# Patient Record
Sex: Female | Born: 1950 | Race: White | Hispanic: No | Marital: Married | Smoking: Never smoker
Health system: Southern US, Community
[De-identification: ages and names within clinical notes are randomized; demographics above are authoritative.]

## PROBLEM LIST (undated history)

## (undated) DIAGNOSIS — E785 Hyperlipidemia, unspecified: Secondary | ICD-10-CM

## (undated) DIAGNOSIS — I517 Cardiomegaly: Secondary | ICD-10-CM

## (undated) DIAGNOSIS — I350 Nonrheumatic aortic (valve) stenosis: Secondary | ICD-10-CM

## (undated) DIAGNOSIS — E669 Obesity, unspecified: Secondary | ICD-10-CM

## (undated) DIAGNOSIS — J449 Chronic obstructive pulmonary disease, unspecified: Secondary | ICD-10-CM

## (undated) DIAGNOSIS — K219 Gastro-esophageal reflux disease without esophagitis: Secondary | ICD-10-CM

## (undated) DIAGNOSIS — I119 Hypertensive heart disease without heart failure: Secondary | ICD-10-CM

## (undated) DIAGNOSIS — I6523 Occlusion and stenosis of bilateral carotid arteries: Secondary | ICD-10-CM

## (undated) DIAGNOSIS — E109 Type 1 diabetes mellitus without complications: Secondary | ICD-10-CM

## (undated) DIAGNOSIS — N183 Chronic kidney disease, stage 3 unspecified: Secondary | ICD-10-CM

## (undated) DIAGNOSIS — I2510 Atherosclerotic heart disease of native coronary artery without angina pectoris: Secondary | ICD-10-CM

## (undated) DIAGNOSIS — E039 Hypothyroidism, unspecified: Secondary | ICD-10-CM

## (undated) HISTORY — PX: APPENDECTOMY: SHX54

## (undated) HISTORY — DX: Atherosclerotic heart disease of native coronary artery without angina pectoris: I25.10

## (undated) HISTORY — DX: Occlusion and stenosis of bilateral carotid arteries: I65.23

## (undated) HISTORY — DX: Gastro-esophageal reflux disease without esophagitis: K21.9

## (undated) HISTORY — DX: Hyperlipidemia, unspecified: E78.5

## (undated) HISTORY — DX: Cardiomegaly: I51.7

## (undated) HISTORY — DX: Hypothyroidism, unspecified: E03.9

## (undated) HISTORY — DX: Obesity, unspecified: E66.9

## (undated) HISTORY — PX: KNEE SURGERY: SHX244

## (undated) HISTORY — PX: TONSILLECTOMY: SUR1361

## (undated) HISTORY — DX: Nonrheumatic aortic (valve) stenosis: I35.0

## (undated) HISTORY — PX: ABDOMINAL HYSTERECTOMY: SHX81

---

## 1998-06-05 ENCOUNTER — Inpatient Hospital Stay (HOSPITAL_COMMUNITY): Admission: EM | Admit: 1998-06-05 | Discharge: 1998-06-09 | Payer: Self-pay | Admitting: *Deleted

## 1998-06-26 ENCOUNTER — Encounter (HOSPITAL_COMMUNITY): Admission: RE | Admit: 1998-06-26 | Discharge: 1998-09-24 | Payer: Self-pay | Admitting: *Deleted

## 1998-08-23 ENCOUNTER — Ambulatory Visit (HOSPITAL_COMMUNITY): Admission: RE | Admit: 1998-08-23 | Discharge: 1998-08-23 | Payer: Self-pay | Admitting: Orthopaedic Surgery

## 2000-12-29 ENCOUNTER — Encounter: Admission: RE | Admit: 2000-12-29 | Discharge: 2000-12-29 | Payer: Self-pay

## 2007-04-04 ENCOUNTER — Encounter: Admission: RE | Admit: 2007-04-04 | Discharge: 2007-04-04 | Payer: Self-pay | Admitting: Orthopedic Surgery

## 2010-01-19 ENCOUNTER — Encounter (INDEPENDENT_AMBULATORY_CARE_PROVIDER_SITE_OTHER): Payer: Self-pay | Admitting: *Deleted

## 2010-01-24 ENCOUNTER — Encounter (INDEPENDENT_AMBULATORY_CARE_PROVIDER_SITE_OTHER): Payer: Self-pay | Admitting: *Deleted

## 2010-01-26 ENCOUNTER — Encounter (INDEPENDENT_AMBULATORY_CARE_PROVIDER_SITE_OTHER): Payer: Self-pay | Admitting: *Deleted

## 2010-01-26 ENCOUNTER — Ambulatory Visit: Payer: Self-pay | Admitting: Gastroenterology

## 2010-09-10 ENCOUNTER — Ambulatory Visit: Payer: Self-pay | Admitting: Oncology

## 2010-09-26 LAB — CBC & DIFF AND RETIC
Basophils Absolute: 0 10*3/uL (ref 0.0–0.1)
Eosinophils Absolute: 0.3 10*3/uL (ref 0.0–0.5)
HCT: 32.1 % — ABNORMAL LOW (ref 34.8–46.6)
HGB: 10.3 g/dL — ABNORMAL LOW (ref 11.6–15.9)
LYMPH%: 18.1 % (ref 14.0–49.7)
MCV: 96.4 fL (ref 79.5–101.0)
MONO%: 6.1 % (ref 0.0–14.0)
NEUT#: 6 10*3/uL (ref 1.5–6.5)
Platelets: 247 10*3/uL (ref 145–400)
RDW: 14.2 % (ref 11.2–14.5)
Retic Ct Abs: 54.61 10*3/uL (ref 18.30–72.70)

## 2010-09-26 LAB — MORPHOLOGY: PLT EST: ADEQUATE

## 2010-09-28 LAB — IRON AND TIBC: UIBC: 298 ug/dL

## 2010-09-28 LAB — PROTEIN ELECTROPHORESIS, SERUM
Albumin ELP: 56.4 % (ref 55.8–66.1)
Beta Globulin: 6.7 % (ref 4.7–7.2)
Total Protein, Serum Electrophoresis: 7.2 g/dL (ref 6.0–8.3)

## 2010-09-28 LAB — COMPREHENSIVE METABOLIC PANEL
AST: 12 U/L (ref 0–37)
Albumin: 4.4 g/dL (ref 3.5–5.2)
BUN: 12 mg/dL (ref 6–23)
Calcium: 10.2 mg/dL (ref 8.4–10.5)
Chloride: 104 mEq/L (ref 96–112)
Potassium: 4.7 mEq/L (ref 3.5–5.3)

## 2010-09-28 LAB — KAPPA/LAMBDA LIGHT CHAINS
Kappa free light chain: 2.16 mg/dL — ABNORMAL HIGH (ref 0.33–1.94)
Kappa:Lambda Ratio: 0.76 (ref 0.26–1.65)
Lambda Free Lght Chn: 2.83 mg/dL — ABNORMAL HIGH (ref 0.57–2.63)

## 2010-09-28 LAB — VITAMIN B12: Vitamin B-12: 273 pg/mL (ref 211–911)

## 2010-09-28 LAB — ERYTHROPOIETIN: Erythropoietin: 22.7 m[IU]/mL (ref 2.6–34.0)

## 2010-09-28 LAB — FOLATE: Folate: 13.6 ng/mL

## 2010-11-29 ENCOUNTER — Ambulatory Visit: Payer: Self-pay | Admitting: Oncology

## 2010-12-03 LAB — CBC WITH DIFFERENTIAL/PLATELET
BASO%: 0.3 % (ref 0.0–2.0)
Basophils Absolute: 0 10*3/uL (ref 0.0–0.1)
EOS%: 4.5 % (ref 0.0–7.0)
Eosinophils Absolute: 0.4 10*3/uL (ref 0.0–0.5)
HCT: 33.9 % — ABNORMAL LOW (ref 34.8–46.6)
HGB: 11.6 g/dL (ref 11.6–15.9)
LYMPH%: 21.4 % (ref 14.0–49.7)
MCH: 32.4 pg (ref 25.1–34.0)
MCHC: 34.1 g/dL (ref 31.5–36.0)
MCV: 95.2 fL (ref 79.5–101.0)
MONO#: 0.4 10*3/uL (ref 0.1–0.9)
MONO%: 5.3 % (ref 0.0–14.0)
NEUT#: 5.8 10*3/uL (ref 1.5–6.5)
NEUT%: 68.5 % (ref 38.4–76.8)
Platelets: 188 10*3/uL (ref 145–400)
RBC: 3.56 10*6/uL — ABNORMAL LOW (ref 3.70–5.45)
RDW: 15.4 % — ABNORMAL HIGH (ref 11.2–14.5)
WBC: 8.4 10*3/uL (ref 3.9–10.3)
lymph#: 1.8 10*3/uL (ref 0.9–3.3)

## 2010-12-08 ENCOUNTER — Encounter: Payer: Self-pay | Admitting: Gastroenterology

## 2010-12-08 ENCOUNTER — Encounter: Payer: Self-pay | Admitting: Sports Medicine

## 2010-12-20 NOTE — Letter (Signed)
Summary: Previsit letter  [facility]  [address] [place], [place] [address]   Phone: [phone]  Fax: [phone]       01/19/2010 MRN: [id number]  [facility] [address] [place], [place]  [address]  Dear Ms. Channing,  Welcome to the Gastroenterology Division at [facility].    You are scheduled to see a nurse for your pre-procedure visit on 01-26-10 at 11:00a.m. on the 3rd floor at [facility], [address]  We ask that you try to arrive at our office 15 minutes prior to your appointment time to allow for check-in.  Your nurse visit will consist of discussing your medical and surgical history, your immediate family medical history, and your medications.    Please bring a complete list of all your medications or, if you prefer, bring the medication bottles and we will list them.  We will need to be aware of both prescribed and over the counter drugs.  We will need to know exact dosage information as well.  If you are on blood thinners (Coumadin, Plavix, Aggrenox, Ticlid, etc.) please call our office today/prior to your appointment, as we need to consult with your physician about holding your medication.   Please be prepared to read and sign documents such as consent forms, a financial agreement, and acknowledgement forms.  If necessary, and with your consent, a friend or relative is welcome to sit-in on the nurse visit with you.  Please bring your insurance card so that we may make a copy of it.  If your insurance requires a referral to see a specialist, please bring your referral form from your primary care physician.  No co-pay is required for this nurse visit.     If you cannot keep your appointment, please call [phone] to cancel or reschedule prior to your appointment date.  This allows [place] the opportunity to schedule an appointment for another patient in need of care.    Thank you for choosing [facility] for your  medical needs.  We appreciate the opportunity to care for you.  Please visit [place] at our website  to learn more about our practice.                     Sincerely.                                                                                                                   The Gastroenterology Division

## 2010-12-20 NOTE — Letter (Signed)
Summary: [facility] Instructions  [facility]  [address] [place], [place] [address]   Phone: [phone]  Fax: [phone]       Teresa Hart    [birth year]    MRN: [id number]        Procedure Day Dorna Bloom:  Lenor Coffin  02/08/10     Arrival Time:  8:00AM     Procedure Time:  9:00AM     Location of Procedure:                    Juliann Pares _  [facility] (4th Floor)                      PREPARATION FOR COLONOSCOPY WITH MOVIPREP   Starting 5 days prior to your procedure 02/03/10 do not eat nuts, seeds, popcorn, corn, beans, peas,  salads, or any raw vegetables.  Do not take any fiber supplements (e.g. Metamucil, Citrucel, and Benefiber).  THE DAY BEFORE YOUR PROCEDURE         DATE:02/07/10  DAY: WEDNESDAY  1.  Drink clear liquids the entire day-NO SOLID FOOD  2.  Do not drink anything colored red or purple.  Avoid juices with pulp.  No orange juice.  3.  Drink at least 64 oz. (8 glasses) of fluid/clear liquids during the day to prevent dehydration and help the prep work efficiently.  CLEAR LIQUIDS INCLUDE: Water Jello Ice Popsicles Tea (sugar ok, no milk/cream) Powdered fruit flavored drinks Coffee (sugar ok, no milk/cream) Gatorade Juice: apple, white grape, white cranberry  Lemonade Clear bullion, consomm, broth Carbonated beverages (any kind) Strained chicken noodle soup Hard Candy                             4.  In the morning, mix first dose of MoviPrep solution:    Empty 1 Pouch A and 1 Pouch B into the disposable container    Add lukewarm drinking water to the top line of the container. Mix to dissolve    Refrigerate (mixed solution should be used within 24 hrs)  5.  Begin drinking the prep at 5:00 p.m. The MoviPrep container is divided by 4 marks.   Every 15 minutes drink the solution down to the next mark (approximately 8 oz) until the full liter is complete.   6.  Follow completed prep with 16 oz of clear liquid of your choice  (Nothing red or purple).  Continue to drink clear liquids until bedtime.  7.  Before going to bed, mix second dose of MoviPrep solution:    Empty 1 Pouch A and 1 Pouch B into the disposable container    Add lukewarm drinking water to the top line of the container. Mix to dissolve    Refrigerate  THE DAY OF YOUR PROCEDURE      DATE: 02/08/10  DAY: THURSDAY  Beginning at 4:00AMa.m. (5 hours before procedure):         1. Every 15 minutes, drink the solution down to the next mark (approx 8 oz) until the full liter is complete.  2. Follow completed prep with 16 oz. of clear liquid of your choice.    3. You may drink clear liquids until 7:00AM (2 HOURS BEFORE PROCEDURE).   MEDICATION INSTRUCTIONS  Unless otherwise instructed, you should take regular prescription medications with a small sip of water   as early as possible the morning of your procedure.  Diabetic patients - see separate instructions.   Additional medication instructions:  hold lisinopril/hctz morning of procedure only.         OTHER INSTRUCTIONS  You will need a responsible adult at least 59 years of age to accompany you and drive you home.   This person must remain in the waiting room during your procedure.  Wear loose fitting clothing that is easily removed.  Leave jewelry and other valuables at home.  However, you may wish to bring a book to read or  an iPod/MP3 player to listen to music as you wait for your procedure to start.  Remove all body piercing jewelry and leave at home.  Total time from sign-in until discharge is approximately 2-3 hours.  You should go home directly after your procedure and rest.  You can resume normal activities the  day after your procedure.  The day of your procedure you should not:   Drive   Make legal decisions   Operate machinery   Drink alcohol   Return to work  You will receive specific instructions about eating, activities and medications before you  leave.    The above instructions have been reviewed and explained to me by       I fully understand and can verbalize these instructions _____________________________ Date _________

## 2010-12-20 NOTE — Letter (Signed)
Summary: Diabetic Instructions  [facility]  [address] [place], [place] [address]   Phone: [phone]  Fax: [phone]    Teresa Hart Sep 18, 1951 MRN: [id number]   _x _   ORAL DIABETIC MEDICATION INSTRUCTIONS  The day before your procedure:   Take your diabetic pill as you do normally  The day of your procedure:   Do not take your diabetic pill    We will check your blood sugar levels during the admission process and again in Recovery before discharging you home  ________________________________________________________________________

## 2010-12-20 NOTE — Miscellaneous (Signed)
Summary: previsit/rm  Clinical Lists Changes  Medications: Added new medication of MOVIPREP 100 GM  SOLR (PEG-KCL-NACL-NASULF-NA ASC-C) As per prep instructions. - Signed Rx of MOVIPREP 100 GM  SOLR (PEG-KCL-NACL-NASULF-NA ASC-C) As per prep instructions.;  #1 x 0;  Signed;  Entered by: Sherren Kerns RN;  Authorized by: Louis Meckel MD;  Method used: Electronically to [facility] [address] [place], [place], [place]  [address], Ph: [phone], Fax: [phone] Allergies: Added new allergy or adverse reaction of PENICILLIN Added new allergy or adverse reaction of AUGMENTIN Added new allergy or adverse reaction of CODEINE Observations: Added new observation of ALLERGY REV: Done (01/26/2010 10:57) Added new observation of NKA: F (01/26/2010 10:57)    Prescriptions: MOVIPREP 100 GM  SOLR (PEG-KCL-NACL-NASULF-NA ASC-C) As per prep instructions.  #1 x 0   Entered by:   Sherren Kerns RN   Authorized by:   Louis Meckel MD   Signed by:   Sherren Kerns RN on 01/26/2010   Method used:   Electronically to        [facility] (retail)       [address]       [place], [place]  [address]       Ph: [phone]       Fax: [phone]   RxID:   [id number]

## 2010-12-28 ENCOUNTER — Encounter (HOSPITAL_BASED_OUTPATIENT_CLINIC_OR_DEPARTMENT_OTHER)
Admission: RE | Admit: 2010-12-28 | Discharge: 2010-12-28 | Disposition: A | Discharge status: Home / Self Care | Payer: MEDICARE | Source: Ambulatory Visit | Attending: Orthopedic Surgery | Admitting: Orthopedic Surgery

## 2010-12-28 DIAGNOSIS — Z01812 Encounter for preprocedural laboratory examination: Secondary | ICD-10-CM | POA: Insufficient documentation

## 2010-12-28 DIAGNOSIS — Z01810 Encounter for preprocedural cardiovascular examination: Secondary | ICD-10-CM | POA: Insufficient documentation

## 2010-12-28 LAB — POCT I-STAT, CHEM 8
BUN: 24 mg/dL — ABNORMAL HIGH (ref 6–23)
Calcium, Ion: 1.3 mmol/L (ref 1.12–1.32)
Chloride: 103 mEq/L (ref 96–112)
Creatinine, Ser: 1.2 mg/dL (ref 0.4–1.2)
Glucose, Bld: 264 mg/dL — ABNORMAL HIGH (ref 70–99)
HCT: 38 % (ref 36.0–46.0)
Hemoglobin: 12.9 g/dL (ref 12.0–15.0)
Potassium: 4.3 mEq/L (ref 3.5–5.1)
Sodium: 135 mEq/L (ref 135–145)
TCO2: 26 mmol/L (ref 0–100)

## 2010-12-31 ENCOUNTER — Ambulatory Visit (HOSPITAL_BASED_OUTPATIENT_CLINIC_OR_DEPARTMENT_OTHER)
Admission: RE | Admit: 2010-12-31 | Discharge: 2010-12-31 | Disposition: A | Discharge status: Home / Self Care | Payer: MEDICARE | Source: Ambulatory Visit | Attending: Orthopedic Surgery | Admitting: Orthopedic Surgery

## 2010-12-31 DIAGNOSIS — Q898 Other specified congenital malformations: Secondary | ICD-10-CM | POA: Insufficient documentation

## 2010-12-31 DIAGNOSIS — M2240 Chondromalacia patellae, unspecified knee: Secondary | ICD-10-CM | POA: Insufficient documentation

## 2010-12-31 DIAGNOSIS — Z01812 Encounter for preprocedural laboratory examination: Secondary | ICD-10-CM | POA: Insufficient documentation

## 2010-12-31 DIAGNOSIS — M2340 Loose body in knee, unspecified knee: Secondary | ICD-10-CM | POA: Insufficient documentation

## 2010-12-31 LAB — POCT HEMOGLOBIN-HEMACUE: Hemoglobin: 11.6 g/dL — ABNORMAL LOW (ref 12.0–15.0)

## 2010-12-31 LAB — GLUCOSE, CAPILLARY: Glucose-Capillary: 196 mg/dL — ABNORMAL HIGH (ref 70–99)

## 2011-01-04 NOTE — Op Note (Signed)
Teresa Hart, Teresa Hart             ACCOUNT NO.:  [id number]  MEDICAL RECORD NO.:  [id number]           PATIENT TYPE:  LOCATION:                                 FACILITY:  PHYSICIAN:  Feliberto Gottron. Turner Daniels, M.D.        DATE OF BIRTH:  DATE OF PROCEDURE:  12/31/2010 DATE OF DISCHARGE:                              OPERATIVE REPORT   PREOPERATIVE DIAGNOSIS:  Right knee medial meniscal tear.  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  PROCEDURE:  Right knee arthroscopic debridement, chondromalacia, removal loose bodies, and cyclops lesion fibrous band.  SURGEON:  Feliberto Gottron.  Turner Daniels, MD  FIRST ASSISTANT:  Shirl Harris, PA-C.  ANESTHETIC:  General LMA.  ESTIMATED BLOOD LOSS:  Minimal.  FLUID REPLACEMENT:  800 mL crystalloid.  DRAINS PLACED:  None.  TOURNIQUET TIME:  None.  INDICATIONS FOR PROCEDURE:  A 60 year old woman with presumed medial meniscal tear of the right knee has failed conserve treatment with anti- inflammatories medicines, exercise, physical therapy, cortisone injection, and has unfortunately had recurrence of her pain, catching, popping.  She desires elective arthroscopic evaluation and treatment of her right knee.  Risks and benefits of surgery have been discussed, questions answered.  DESCRIPTION OF PROCEDURE:  The patient identified by armband, received preoperative IV antibiotics in the holding area at [facility].  She was then taken to operating room 2.  Appropriate anesthetic monitors were attached and general LMA anesthesia induced with the patient in supine position.  Lateral post applied to the table and the right lower extremity prepped, draped in sterile fashion from the ankle to the midthigh.  Time-out procedure performed.  We began the operation by  making standard inferomedial, inferolateral peripatellar portals allowing introduction arthroscope through the inferolateral portal and the outflow through the inferomedial portal.  Diagnostic arthroscopy revealed grade 2 to grade 3 chondromalacia of the patella.  This was debrided with 3.5 gator sucker shaver.  Moving the medial compartment, grade 3 chondromalacia with flap tears debrided from medial femoral condyle and small cartilaginous loose bodies were removed.  This was over a fairly global area of the medial femoral condyle involving about 75% thickness of the cartilage.  There was also a large fibrous band coming out of the notch region near the PCL origin.  It was flipping in and out of the joint between the medial femoral condyle and medial tibial plateau and this was debrided back to the PCL without difficulty and may have represented a cartilage flap tear.  The medial meniscus was thoroughly probed and there was no significant tearing noted.  Lateral compartment was in excellent condition.  The gutters were cleared medially and laterally as very small cartilaginous loose bodies removed with sucker shaver.  Knee irrigated out with normal saline solution. The arthroscopic instruments removed, dressing of Xeroform, 4x4 dressing.  Sponges, Webril, and Ace wrap applied.  The patient awakened, extubated, and taken to the recovery room without difficulty.  Feliberto Gottron. Turner Daniels, M.D.     Ovid Curd  D:  12/31/2010  T:  01/01/2011  Job:  [id number]  Electronically Signed by Gean Birchwood M.D. on 01/04/2011 12:25:30 AM

## 2011-02-19 ENCOUNTER — Other Ambulatory Visit: Payer: Self-pay | Admitting: Oncology

## 2011-02-19 ENCOUNTER — Encounter (HOSPITAL_BASED_OUTPATIENT_CLINIC_OR_DEPARTMENT_OTHER): Payer: Medicare Other | Admitting: Oncology

## 2011-02-19 DIAGNOSIS — E785 Hyperlipidemia, unspecified: Secondary | ICD-10-CM

## 2011-02-19 DIAGNOSIS — D649 Anemia, unspecified: Secondary | ICD-10-CM

## 2011-02-19 DIAGNOSIS — I10 Essential (primary) hypertension: Secondary | ICD-10-CM

## 2011-02-19 DIAGNOSIS — D509 Iron deficiency anemia, unspecified: Secondary | ICD-10-CM

## 2011-02-19 DIAGNOSIS — E119 Type 2 diabetes mellitus without complications: Secondary | ICD-10-CM

## 2011-02-19 LAB — COMPREHENSIVE METABOLIC PANEL
CO2: 23 mEq/L (ref 19–32)
Calcium: 9.5 mg/dL (ref 8.4–10.5)
Chloride: 101 mEq/L (ref 96–112)
Creatinine, Ser: 1.57 mg/dL — ABNORMAL HIGH (ref 0.40–1.20)
Glucose, Bld: 254 mg/dL — ABNORMAL HIGH (ref 70–99)
Sodium: 137 mEq/L (ref 135–145)
Total Bilirubin: 0.3 mg/dL (ref 0.3–1.2)
Total Protein: 6.6 g/dL (ref 6.0–8.3)

## 2011-02-19 LAB — CBC WITH DIFFERENTIAL/PLATELET
Eosinophils Absolute: 0.6 10*3/uL — ABNORMAL HIGH (ref 0.0–0.5)
HCT: 35.2 % (ref 34.8–46.6)
HGB: 12.4 g/dL (ref 11.6–15.9)
LYMPH%: 20.1 % (ref 14.0–49.7)
MONO#: 0.4 10*3/uL (ref 0.1–0.9)
NEUT#: 5.7 10*3/uL (ref 1.5–6.5)
NEUT%: 67.4 % (ref 38.4–76.8)
Platelets: 227 10*3/uL (ref 145–400)
WBC: 8.4 10*3/uL (ref 3.9–10.3)
lymph#: 1.7 10*3/uL (ref 0.9–3.3)

## 2011-02-19 LAB — FERRITIN: Ferritin: 135 ng/mL (ref 10–291)

## 2011-02-19 LAB — IRON AND TIBC
Iron: 99 ug/dL (ref 42–145)
UIBC: 178 ug/dL

## 2011-04-02 ENCOUNTER — Other Ambulatory Visit: Payer: Self-pay | Admitting: Oncology

## 2011-04-02 ENCOUNTER — Encounter (HOSPITAL_BASED_OUTPATIENT_CLINIC_OR_DEPARTMENT_OTHER): Payer: Medicare Other | Admitting: Oncology

## 2011-04-02 DIAGNOSIS — D649 Anemia, unspecified: Secondary | ICD-10-CM

## 2011-04-02 DIAGNOSIS — D509 Iron deficiency anemia, unspecified: Secondary | ICD-10-CM

## 2011-04-02 LAB — CBC WITH DIFFERENTIAL/PLATELET
BASO%: 0.3 % (ref 0.0–2.0)
Basophils Absolute: 0 10*3/uL (ref 0.0–0.1)
EOS%: 4.7 % (ref 0.0–7.0)
HCT: 33.9 % — ABNORMAL LOW (ref 34.8–46.6)
LYMPH%: 21.9 % (ref 14.0–49.7)
MCH: 32.8 pg (ref 25.1–34.0)
MCHC: 34.3 g/dL (ref 31.5–36.0)
MCV: 95.7 fL (ref 79.5–101.0)
MONO%: 4.4 % (ref 0.0–14.0)
NEUT%: 68.7 % (ref 38.4–76.8)
lymph#: 1.7 10*3/uL (ref 0.9–3.3)

## 2011-06-03 ENCOUNTER — Other Ambulatory Visit: Payer: Self-pay | Admitting: Oncology

## 2011-06-03 ENCOUNTER — Encounter (HOSPITAL_BASED_OUTPATIENT_CLINIC_OR_DEPARTMENT_OTHER): Payer: Medicare Other | Admitting: Oncology

## 2011-06-03 DIAGNOSIS — I10 Essential (primary) hypertension: Secondary | ICD-10-CM

## 2011-06-03 DIAGNOSIS — D509 Iron deficiency anemia, unspecified: Secondary | ICD-10-CM

## 2011-06-03 DIAGNOSIS — E119 Type 2 diabetes mellitus without complications: Secondary | ICD-10-CM

## 2011-06-03 DIAGNOSIS — D649 Anemia, unspecified: Secondary | ICD-10-CM

## 2011-06-03 LAB — CBC WITH DIFFERENTIAL/PLATELET
BASO%: 0.2 % (ref 0.0–2.0)
EOS%: 1.4 % (ref 0.0–7.0)
HGB: 12.5 g/dL (ref 11.6–15.9)
MCH: 32.2 pg (ref 25.1–34.0)
MCHC: 33.9 g/dL (ref 31.5–36.0)
MCV: 94.8 fL (ref 79.5–101.0)
MONO%: 4.9 % (ref 0.0–14.0)
RBC: 3.89 10*6/uL (ref 3.70–5.45)
RDW: 13.8 % (ref 11.2–14.5)
lymph#: 1.7 10*3/uL (ref 0.9–3.3)

## 2011-06-03 LAB — IRON AND TIBC: TIBC: 311 ug/dL (ref 250–470)

## 2011-06-03 LAB — COMPREHENSIVE METABOLIC PANEL
ALT: 21 U/L (ref 0–35)
AST: 14 U/L (ref 0–37)
Albumin: 4.3 g/dL (ref 3.5–5.2)
Alkaline Phosphatase: 98 U/L (ref 39–117)
BUN: 24 mg/dL — ABNORMAL HIGH (ref 6–23)
Calcium: 10.3 mg/dL (ref 8.4–10.5)
Chloride: 100 mEq/L (ref 96–112)
Potassium: 5.1 mEq/L (ref 3.5–5.3)
Sodium: 135 mEq/L (ref 135–145)

## 2011-10-28 ENCOUNTER — Encounter: Payer: Self-pay | Admitting: Cardiology

## 2011-10-28 ENCOUNTER — Encounter: Payer: Self-pay | Admitting: *Deleted

## 2011-10-29 ENCOUNTER — Ambulatory Visit (INDEPENDENT_AMBULATORY_CARE_PROVIDER_SITE_OTHER): Payer: Medicare Other | Admitting: Cardiology

## 2011-10-29 ENCOUNTER — Encounter: Payer: Self-pay | Admitting: Cardiology

## 2011-10-29 DIAGNOSIS — I10 Essential (primary) hypertension: Secondary | ICD-10-CM

## 2011-10-29 DIAGNOSIS — R0989 Other specified symptoms and signs involving the circulatory and respiratory systems: Secondary | ICD-10-CM

## 2011-10-29 DIAGNOSIS — R0600 Dyspnea, unspecified: Secondary | ICD-10-CM | POA: Insufficient documentation

## 2011-10-29 DIAGNOSIS — R011 Cardiac murmur, unspecified: Secondary | ICD-10-CM

## 2011-10-29 DIAGNOSIS — E785 Hyperlipidemia, unspecified: Secondary | ICD-10-CM | POA: Insufficient documentation

## 2011-10-29 DIAGNOSIS — I517 Cardiomegaly: Secondary | ICD-10-CM | POA: Insufficient documentation

## 2011-10-29 NOTE — Progress Notes (Signed)
HPI: 60 yo female for evaluation of cardiac enlargement noted on chest xray; R/O cardiomyopathy. Patient has dyspnea on exertion relieved with rest. No orthopnea, PND, pedal edema, palpitations, syncope or chest pain. Cardiac enlargement noted on chest x-ray and we were asked to evaluate.  Current Outpatient Prescriptions  Medication Sig Dispense Refill  . albuterol (VENTOLIN HFA) 108 (90 BASE) MCG/ACT inhaler Inhale 2 puffs into the lungs every 6 (six) hours as needed.        . ARIPiprazole (ABILIFY) 20 MG tablet Take 20 mg by mouth daily.        Marland Kitchen diltiazem (TIAZAC) 360 MG 24 hr capsule Take 360 mg by mouth daily.        . DULoxetine (CYMBALTA) 60 MG capsule Take 60 mg by mouth daily.        . ferrous sulfate 325 (65 FE) MG tablet Take 325 mg by mouth daily with breakfast.        . glimepiride (AMARYL) 4 MG tablet Take 4 mg by mouth 2 (two) times daily.        Marland Kitchen liothyronine (CYTOMEL) 25 MCG tablet Take 25 mcg by mouth 2 (two) times daily.        Marland Kitchen LORazepam (ATIVAN) 1 MG tablet Take 1 mg by mouth as needed.        . metFORMIN (GLUCOPHAGE) 1000 MG tablet Take 1,000 mg by mouth 2 (two) times daily with a meal.        . omeprazole (PRILOSEC) 20 MG capsule Take 20 mg by mouth 2 (two) times daily.        . pravastatin (PRAVACHOL) 40 MG tablet Take 40 mg by mouth daily.          Allergies  Allergen Reactions  . Codeine     REACTION: nausea  . ZOX:[id number]+XBJYNWGNF+AOZHYQMVHQ Acid+Aspartame     REACTION: nausea  . Penicillins     REACTION: major hives,swelling    Past Medical History  Diagnosis Date  . HTN (hypertension)   . Hyperlipidemia   . GERD (gastroesophageal reflux disease)   . Obese   . Diabetes mellitus   . Hypothyroid   . Asthma   . Renal insufficiency     Past Surgical History  Procedure Date  . Abdominal hysterectomy   . Tonsillectomy   . Knee surgery   . Appendectomy     History   Social History  . Marital Status: Married    Spouse Name: N/A    Number  of Children: 0  . Years of Education: N/A   Occupational History  . Not on file.   Social History Main Topics  . Smoking status: Never Smoker   . Smokeless tobacco: Not on file  . Alcohol Use: No  . Drug Use: Not on file  . Sexually Active: Not on file   Other Topics Concern  . Not on file   Social History Narrative  . No narrative on file    Family History  Problem Relation Age of Onset  . Coronary artery disease Brother     CABG 59  . Coronary artery disease Brother     CABG 58    ROS: no fevers or chills, productive cough, hemoptysis, dysphasia, odynophagia, melena, hematochezia, dysuria, hematuria, rash, seizure activity, orthopnea, PND, pedal edema, claudication. Remaining systems are negative.  Physical Exam:   Blood pressure 151/78, pulse 84, height 5\' 5"  (1.651 m), weight 191 lb (86.637 kg).  General:  Well developed/well nourished in NAD Skin warm/dry  Patient not depressed No peripheral clubbing Back-normal HEENT-normal/normal eyelids Neck supple/normal carotid upstroke bilaterally; left carotid bruit; no JVD; no thyromegaly chest - CTA/ normal expansion CV - RRR/normal S1 and S2; no rubs or gallops;  PMI nondisplaced; 2/6 systolic murmur left sternal border. Abdomen -NT/ND, no HSM, no mass, + bowel sounds, no bruit 1+ femoral pulses, no bruits Ext-no edema, chords, 2+ PT Neuro-grossly nonfocal  ECG NSR with no ST changes

## 2011-10-29 NOTE — Assessment & Plan Note (Signed)
Blood pressure mildly elevated. We will follow this and increase medications as needed. She is not on an ACE inhibitor but apparently has renal insufficiency which may be the reason. She is followed by nephrology.

## 2011-10-29 NOTE — Assessment & Plan Note (Signed)
Echocardiogram to further evaluate. Probable mild aortic stenosis.

## 2011-10-29 NOTE — Patient Instructions (Signed)
Your physician recommends that you schedule a follow-up appointment in: 6-8 WEEKS  Your physician has requested that you have an echocardiogram. Echocardiography is a painless test that uses sound waves to create images of your heart. It provides your doctor with information about the size and shape of your heart and how well your heart's chambers and valves are working. This procedure takes approximately one hour. There are no restrictions for this procedure.   Your physician has requested that you have a lexiscan myoview. For further information please visit [email or web address] Please follow instruction sheet, as given.   Your physician has requested that you have a carotid duplex. This test is an ultrasound of the carotid arteries in your neck. It looks at blood flow through these arteries that supply the brain with blood. Allow one hour for this exam. There are no restrictions or special instructions.   START ASPIRIN 81 MG ONCE DAILY WITH FOOD

## 2011-10-29 NOTE — Assessment & Plan Note (Signed)
Continue statin. 

## 2011-10-29 NOTE — Assessment & Plan Note (Signed)
Multiple risk factors including diabetes, hypertension, hyperlipidemia and family history. Schedule myoview for risk stratification.

## 2011-10-29 NOTE — Assessment & Plan Note (Signed)
Scheduled echocardiogram to quantify LV function.

## 2011-10-29 NOTE — Assessment & Plan Note (Signed)
Carotid Dopplers were left carotid bruit. Enteric-coated aspirin 81 mg daily.

## 2011-11-15 NOTE — Progress Notes (Signed)
Addended by: Kem Parkinson on: 11/15/2011 03:24 PM   Modules accepted: Orders

## 2011-11-18 ENCOUNTER — Ambulatory Visit (HOSPITAL_COMMUNITY): Payer: Medicare Other | Attending: Cardiology | Admitting: Radiology

## 2011-11-18 DIAGNOSIS — R011 Cardiac murmur, unspecified: Secondary | ICD-10-CM | POA: Insufficient documentation

## 2011-11-18 DIAGNOSIS — E785 Hyperlipidemia, unspecified: Secondary | ICD-10-CM | POA: Insufficient documentation

## 2011-11-18 DIAGNOSIS — I10 Essential (primary) hypertension: Secondary | ICD-10-CM | POA: Insufficient documentation

## 2011-11-18 DIAGNOSIS — I517 Cardiomegaly: Secondary | ICD-10-CM | POA: Insufficient documentation

## 2011-11-18 DIAGNOSIS — R0602 Shortness of breath: Secondary | ICD-10-CM | POA: Insufficient documentation

## 2011-11-19 HISTORY — PX: CARDIAC CATHETERIZATION: SHX172

## 2011-11-20 ENCOUNTER — Other Ambulatory Visit (HOSPITAL_COMMUNITY): Payer: Medicare Other | Admitting: Radiology

## 2011-11-21 ENCOUNTER — Telehealth: Payer: Self-pay | Admitting: Cardiology

## 2011-11-21 NOTE — Telephone Encounter (Signed)
Left message for pt of echo results

## 2011-11-21 NOTE — Telephone Encounter (Signed)
Fu call °Pt returning your call  °

## 2011-11-21 NOTE — Telephone Encounter (Signed)
FU Call: Pt returning call to our office. Please return pt call.

## 2011-11-25 ENCOUNTER — Encounter: Payer: Self-pay | Admitting: Cardiology

## 2011-11-25 ENCOUNTER — Telehealth: Payer: Self-pay | Admitting: Cardiology

## 2011-11-25 NOTE — Telephone Encounter (Signed)
Spoke with pt husband, aware okay for stress test. All questions answered

## 2011-11-25 NOTE — Telephone Encounter (Signed)
New msg Pt said she has groin pull and it hurts to walk and wants to know if this would interfere with lexiscan she has scheduled for tomorrow

## 2011-11-26 ENCOUNTER — Other Ambulatory Visit: Payer: Self-pay | Admitting: Family Medicine

## 2011-11-26 ENCOUNTER — Ambulatory Visit (INDEPENDENT_AMBULATORY_CARE_PROVIDER_SITE_OTHER): Payer: Medicare Other

## 2011-11-26 ENCOUNTER — Ambulatory Visit
Admission: RE | Admit: 2011-11-26 | Discharge: 2011-11-26 | Disposition: A | Discharge status: Home / Self Care | Payer: Medicare Other | Source: Ambulatory Visit | Attending: Family Medicine | Admitting: Family Medicine

## 2011-11-26 ENCOUNTER — Ambulatory Visit (HOSPITAL_COMMUNITY): Payer: Medicare Other | Attending: Cardiology | Admitting: Radiology

## 2011-11-26 ENCOUNTER — Encounter: Payer: Medicare Other | Admitting: *Deleted

## 2011-11-26 ENCOUNTER — Other Ambulatory Visit (HOSPITAL_COMMUNITY): Payer: Medicare Other

## 2011-11-26 VITALS — BP 106/78 | Ht 65.0 in | Wt 190.0 lb

## 2011-11-26 DIAGNOSIS — R1031 Right lower quadrant pain: Secondary | ICD-10-CM

## 2011-11-26 DIAGNOSIS — E785 Hyperlipidemia, unspecified: Secondary | ICD-10-CM | POA: Insufficient documentation

## 2011-11-26 DIAGNOSIS — E119 Type 2 diabetes mellitus without complications: Secondary | ICD-10-CM | POA: Insufficient documentation

## 2011-11-26 DIAGNOSIS — R0600 Dyspnea, unspecified: Secondary | ICD-10-CM

## 2011-11-26 DIAGNOSIS — Z8249 Family history of ischemic heart disease and other diseases of the circulatory system: Secondary | ICD-10-CM | POA: Insufficient documentation

## 2011-11-26 DIAGNOSIS — R0602 Shortness of breath: Secondary | ICD-10-CM

## 2011-11-26 DIAGNOSIS — R0609 Other forms of dyspnea: Secondary | ICD-10-CM | POA: Insufficient documentation

## 2011-11-26 DIAGNOSIS — I10 Essential (primary) hypertension: Secondary | ICD-10-CM | POA: Insufficient documentation

## 2011-11-26 DIAGNOSIS — M79609 Pain in unspecified limb: Secondary | ICD-10-CM

## 2011-11-26 DIAGNOSIS — E039 Hypothyroidism, unspecified: Secondary | ICD-10-CM

## 2011-11-26 DIAGNOSIS — IMO0001 Reserved for inherently not codable concepts without codable children: Secondary | ICD-10-CM

## 2011-11-26 DIAGNOSIS — R0989 Other specified symptoms and signs involving the circulatory and respiratory systems: Secondary | ICD-10-CM | POA: Insufficient documentation

## 2011-11-26 MED ORDER — TECHNETIUM TC 99M TETROFOSMIN IV KIT
33.0000 | PACK | Freq: Once | INTRAVENOUS | Status: AC | PRN
  Administered 2011-11-26: 33 via INTRAVENOUS

## 2011-11-26 MED ORDER — TECHNETIUM TC 99M TETROFOSMIN IV KIT
11.0000 | PACK | Freq: Once | INTRAVENOUS | Status: AC | PRN
  Administered 2011-11-26: 11 via INTRAVENOUS

## 2011-11-26 MED ORDER — REGADENOSON 0.4 MG/5ML IV SOLN
0.4000 mg | Freq: Once | INTRAVENOUS | Status: AC
  Administered 2011-11-26: 0.4 mg via INTRAVENOUS

## 2011-11-26 NOTE — Progress Notes (Signed)
[facility] SITE 3 NUCLEAR MED [address] [place] [address] [phone]  Cardiology Nuclear Med Study  Teresa Hart is a 61 y.o. female [id number] 03/13/1951   Nuclear Med Background Indication for Stress Test:  Evaluation for Ischemia History:  12/31/12Echo:EF=60-65%, mild MR Cardiac Risk Factors: Family History - CAD, Hypertension, Lipids, Carotid Bruit and NIDDM  Symptoms:  DOE   Nuclear Pre-Procedure Caffeine/Decaff Intake:  None NPO After: 7:30pm   Lungs:  Clear.  O2 Sat 99% on RA IV 0.9% NS with Angio Cath:  20g  IV Site: R Antecubital x 1, tolerated well IV Started by:  Irean Hong, RN  Chest Size (in):  48 Cup Size: B  Height: 5\' 5"  (1.651 m)  Weight:  190 lb (86.183 kg)  BMI:  Body mass index is 31.62 kg/(m^2). Tech Comments:  No medications taken today, per patient.    Nuclear Med Study 1 or 2 day study: 1 day  Stress Test Type:  Treadmill/Lexiscan  Reading MD: Willa Rough, MD  Order Authorizing Provider:  Olga Millers, MD  Resting Radionuclide: Technetium 42m Tetrofosmin  Resting Radionuclide Dose: 11.0 mCi   Stress Radionuclide:  Technetium 90m Tetrofosmin  Stress Radionuclide Dose: 33.0 mCi           Stress Protocol Rest HR: 84 Stress HR: 123  Rest BP: Sitting:106/78;             Standing:179/68 Stress BP: 214/64  Exercise Time (min): 2:00 METS: n/a   Predicted Max HR: 161 bpm % Max HR: 76.4 bpm Rate Pressure Product: 41324   Dose of Adenosine (mg):  n/a Dose of Lexiscan: 0.4 mg  Dose of Atropine (mg): n/a Dose of Dobutamine: n/a mcg/kg/min (at max HR)  Stress Test Technologist: Smiley Houseman, CMA-N  Nuclear Technologist:  Domenic Polite, CNMT     Rest Procedure:  Myocardial perfusion imaging was performed at rest 45 minutes following the intravenous administration of Technetium 31m Tetrofosmin.  Rest ECG: No acute changes.  Stress Procedure:  The patient received IV Lexiscan 0.4 mg over 15-seconds with  concurrent low level exercise and then Technetium 7m Tetrofosmin was injected at 30-seconds while the patient continued walking one more minute.  There were nonspecific ST-T wave changes in late recovery with Lexiscan.  She had a hypertensive response to [place], 214/64.  Quantitative spect images were obtained after a 45-minute delay.  Stress ECG: No significant change from baseline ECG  Note difference in baseline BP:  Sitting 106/78 and Standing 179/68.  BP after images sitting:152/68 and standing: 134/71.  QPS Raw Data Images:  Normal; no motion artifact; normal heart/lung ratio. Stress Images:  Marked decrease in uptake in the inferolateral wall and the lateral wall Rest Images:  Similar to stress Subtraction (SDS):  Mild reversibility in the lateral wall. Transient Ischemic Dilatation (Normal <1.22):  1.09 Lung/Heart Ratio (Normal <0.45):  0.30  Quantitative Gated Spect Images QGS EDV:  101 ml QGS ESV:  39 ml QGS cine images:  Mild decrease in activity in the lateral wall. QGS EF: 61%  Impression Exercise Capacity:  Lexiscan with low level exercise. BP Response:  Hypertensive blood pressure response. Clinical Symptoms:  No chest pain. ECG Impression:  No significant ST segment change suggestive of ischemia. Comparison with Prior Nuclear Study: No previous nuclear study performed  Overall Impression:  The data is difficult to explain. The tomographic images show a large scar affecting the inferolateral and lateral walls with mild ischemia. Wall motion is suprisingly  near normal. However, this study suggests significant scar with mid ischemia.  Willa Rough, MD

## 2011-12-02 ENCOUNTER — Encounter (INDEPENDENT_AMBULATORY_CARE_PROVIDER_SITE_OTHER): Payer: Medicare Other | Admitting: *Deleted

## 2011-12-02 DIAGNOSIS — R0989 Other specified symptoms and signs involving the circulatory and respiratory systems: Secondary | ICD-10-CM

## 2011-12-02 DIAGNOSIS — I6529 Occlusion and stenosis of unspecified carotid artery: Secondary | ICD-10-CM

## 2011-12-10 ENCOUNTER — Encounter: Payer: Self-pay | Admitting: *Deleted

## 2011-12-10 ENCOUNTER — Encounter: Payer: Self-pay | Admitting: Cardiology

## 2011-12-10 ENCOUNTER — Ambulatory Visit (INDEPENDENT_AMBULATORY_CARE_PROVIDER_SITE_OTHER): Payer: Medicare Other | Admitting: Cardiology

## 2011-12-10 ENCOUNTER — Other Ambulatory Visit: Payer: Self-pay | Admitting: Cardiology

## 2011-12-10 DIAGNOSIS — I679 Cerebrovascular disease, unspecified: Secondary | ICD-10-CM | POA: Insufficient documentation

## 2011-12-10 DIAGNOSIS — R9430 Abnormal result of cardiovascular function study, unspecified: Secondary | ICD-10-CM

## 2011-12-10 DIAGNOSIS — E785 Hyperlipidemia, unspecified: Secondary | ICD-10-CM

## 2011-12-10 DIAGNOSIS — I517 Cardiomegaly: Secondary | ICD-10-CM

## 2011-12-10 DIAGNOSIS — I10 Essential (primary) hypertension: Secondary | ICD-10-CM

## 2011-12-10 DIAGNOSIS — Z01810 Encounter for preprocedural cardiovascular examination: Secondary | ICD-10-CM

## 2011-12-10 DIAGNOSIS — R011 Cardiac murmur, unspecified: Secondary | ICD-10-CM

## 2011-12-10 LAB — CBC WITH DIFFERENTIAL/PLATELET
Basophils Absolute: 0 10*3/uL (ref 0.0–0.1)
Eosinophils Absolute: 0.5 10*3/uL (ref 0.0–0.7)
HCT: 36.4 % (ref 36.0–46.0)
Hemoglobin: 12.4 g/dL (ref 12.0–15.0)
Lymphs Abs: 2.2 10*3/uL (ref 0.7–4.0)
MCHC: 34 g/dL (ref 30.0–36.0)
Monocytes Absolute: 0.5 10*3/uL (ref 0.1–1.0)
Neutro Abs: 6.5 10*3/uL (ref 1.4–7.7)
Platelets: 237 10*3/uL (ref 150.0–400.0)
RDW: 13.8 % (ref 11.5–14.6)

## 2011-12-10 LAB — BASIC METABOLIC PANEL
Calcium: 10 mg/dL (ref 8.4–10.5)
Creatinine, Ser: 1.3 mg/dL — ABNORMAL HIGH (ref 0.4–1.2)
GFR: 42.96 mL/min — ABNORMAL LOW (ref >60.00)
Sodium: 136 mEq/L (ref 135–145)

## 2011-12-10 LAB — PROTIME-INR: Prothrombin Time: 10.3 s (ref 10.2–12.4)

## 2011-12-10 MED ORDER — SODIUM CHLORIDE 0.9 % IV SOLN: 250.0000 mL | INTRAVENOUS | Status: DC | PRN

## 2011-12-10 MED ORDER — SODIUM CHLORIDE 0.9 % IJ SOLN: 3.0000 mL | INTRAMUSCULAR | Status: DC | PRN

## 2011-12-10 MED ORDER — SODIUM CHLORIDE 0.9 % IJ SOLN: 3.0000 mL | Freq: Two times a day (BID) | INTRAMUSCULAR | Status: DC

## 2011-12-10 NOTE — Assessment & Plan Note (Addendum)
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 She has had no chest pain but has 10 years of diabetes mellitus. She therefore may not have had symptoms. Continue aspirin and statin. I feel cardiac catheterization is warranted. The risks and benefits including myocardial infarction, stroke, death and renal insufficiency were discussed and the patient agrees to proceed. Note she has had some degree of renal insufficiency in the past. We will obtain her most recent blood work from her nephrologist. If she has significant renal insufficiency we will need to admit the night prior to the procedure for IV hydration. Otherwise we potentially could increase by mouth fluid intake the night prior to her procedure and proceed as an outpatient giving IV fluids the morning of the catheterization.  I obtained labs from nephrology this morning. BUN and creatinine on January 7 was 13 and 1.17. Upper limit of normal for creatinine for that lab is 1.0. GFR 51. I have asked the patient to increase by mouth fluid intake the night prior to the procedure. She will be hydrated the morning of the procedure. No V. gram. Repeat potassium and renal function the following morning. We will plan to proceed as an outpatient based on these results.

## 2011-12-10 NOTE — Assessment & Plan Note (Signed)
Mild aortic stenosis on previous echocardiogram. We will plan followup studies in the future.

## 2011-12-10 NOTE — Assessment & Plan Note (Signed)
Continue aspirin and statin. Followup carotid Dopplers July 2013. 

## 2011-12-10 NOTE — Assessment & Plan Note (Signed)
Blood pressure controlled. Continue present medications. 

## 2011-12-10 NOTE — Assessment & Plan Note (Signed)
Continue statin. 

## 2011-12-10 NOTE — Progress Notes (Signed)
HPI: 61 year old female I saw in December of 2012 for evaluation of cardiac enlargement. Echocardiogram in December of 2012 showed normal LV function, probable mild aortic stenosis with a mean gradient of 12 mm of mercury, mild mitral regurgitation and mild left atrial enlargement. Carotid Dopplers in January of 2013 showed a 60-79% right and 40-59% left stenosis. Followup recommended in 6 months. Myoview in January of 2013 showed an ejection fraction of 61%. There is an inferolateral scar with mild ischemia. Since I last saw her, she has mild dyspnea on exertion but no orthopnea, PND, pedal edema, chest pain or syncope.  Current Outpatient Prescriptions  Medication Sig Dispense Refill  . albuterol (PROVENTIL HFA;VENTOLIN HFA) 108 (90 BASE) MCG/ACT inhaler Inhale 2 puffs into the lungs every 6 (six) hours as needed.      . ARIPiprazole (ABILIFY) 20 MG tablet Take 20 mg by mouth daily.        Marland Kitchen aspirin EC 81 MG tablet Take 1 tablet (81 mg total) by mouth daily.  150 tablet  2  . diltiazem (TIAZAC) 360 MG 24 hr capsule Take 360 mg by mouth daily.        . DULoxetine (CYMBALTA) 60 MG capsule Take 60 mg by mouth daily.        Marland Kitchen glimepiride (AMARYL) 4 MG tablet Take 4 mg by mouth 2 (two) times daily.        Marland Kitchen liothyronine (CYTOMEL) 25 MCG tablet Take 25 mcg by mouth 2 (two) times daily.        Marland Kitchen LORazepam (ATIVAN) 1 MG tablet Take 1 mg by mouth as needed.        . metFORMIN (GLUCOPHAGE) 1000 MG tablet Take 1,000 mg by mouth 2 (two) times daily with a meal.        . omeprazole (PRILOSEC) 20 MG capsule Take 20 mg by mouth 2 (two) times daily.        . pravastatin (PRAVACHOL) 40 MG tablet Take 40 mg by mouth daily.        . Vitamin D, Ergocalciferol, (DRISDOL) 50000 UNITS CAPS Take 50,000 Units by mouth every 7 (seven) days.         Past Medical History  Diagnosis Date  . HTN (hypertension)   . Hyperlipidemia   . GERD (gastroesophageal reflux disease)   . Obese   . Diabetes mellitus   .  Hypothyroid   . Asthma   . Renal insufficiency     Past Surgical History  Procedure Date  . Abdominal hysterectomy   . Tonsillectomy   . Knee surgery   . Appendectomy     History   Social History  . Marital Status: Married    Spouse Name: N/A    Number of Children: 0  . Years of Education: N/A   Occupational History  . Not on file.   Social History Main Topics  . Smoking status: Never Smoker   . Smokeless tobacco: Never Used  . Alcohol Use: No  . Drug Use: Not on file  . Sexually Active: Not on file   Other Topics Concern  . Not on file   Social History Narrative  . No narrative on file    ROS: no fevers or chills, productive cough, hemoptysis, dysphasia, odynophagia, melena, hematochezia, dysuria, hematuria, rash, seizure activity, orthopnea, PND, pedal edema, claudication. Remaining systems are negative.  Physical Exam: Well-developed well-nourished in no acute distress.  Skin is warm and dry.  HEENT is normal.  Neck is supple.  No thyromegaly.  Chest is clear to auscultation with normal expansion.  Cardiovascular exam is regular rate and rhythm. 2/6 systolic murmur left sternal border Abdominal exam nontender or distended. No masses palpated. Extremities show no edema. neuro grossly intact

## 2011-12-10 NOTE — Patient Instructions (Addendum)
Your physician recommends that you schedule a follow-up appointment in: 3 weeks  Your physician has requested that you have a cardiac catheterization. Cardiac catheterization is used to diagnose and/or treat various heart conditions. Doctors may recommend this procedure for a number of different reasons. The most common reason is to evaluate chest pain. Chest pain can be a symptom of coronary artery disease (CAD), and cardiac catheterization can show whether plaque is narrowing or blocking your heart's arteries. This procedure is also used to evaluate the valves, as well as measure the blood flow and oxygen levels in different parts of your heart. For further information please visit [email or web address] Please follow instruction sheet, as given.    RETURN TO THE OFFICE Friday 12-13-11 FOR LAB WORK

## 2011-12-12 ENCOUNTER — Encounter (HOSPITAL_BASED_OUTPATIENT_CLINIC_OR_DEPARTMENT_OTHER)
Admission: RE | Disposition: A | Discharge status: Home / Self Care | Payer: Self-pay | Source: Ambulatory Visit | Attending: Cardiovascular Disease

## 2011-12-12 ENCOUNTER — Inpatient Hospital Stay (HOSPITAL_BASED_OUTPATIENT_CLINIC_OR_DEPARTMENT_OTHER)
Admission: RE | Admit: 2011-12-12 | Discharge: 2011-12-12 | Disposition: A | Discharge status: Home / Self Care | Payer: Medicare Other | Source: Ambulatory Visit | Attending: Cardiovascular Disease | Admitting: Cardiovascular Disease

## 2011-12-12 DIAGNOSIS — I2510 Atherosclerotic heart disease of native coronary artery without angina pectoris: Secondary | ICD-10-CM | POA: Insufficient documentation

## 2011-12-12 DIAGNOSIS — R9430 Abnormal result of cardiovascular function study, unspecified: Secondary | ICD-10-CM

## 2011-12-12 SURGERY — JV LEFT HEART CATHETERIZATION WITH CORONARY ANGIOGRAM
Anesthesia: Moderate Sedation

## 2011-12-12 MED ORDER — SODIUM CHLORIDE 0.9 % IV SOLN: INTRAVENOUS | Status: DC

## 2011-12-12 MED ORDER — DIAZEPAM 2 MG PO TABS
2.0000 mg | ORAL_TABLET | ORAL | Status: AC
  Administered 2011-12-12: 5 mg via ORAL

## 2011-12-12 MED ORDER — ACETAMINOPHEN 325 MG PO TABS: 650.0000 mg | ORAL_TABLET | ORAL | Status: DC | PRN

## 2011-12-12 MED ORDER — ONDANSETRON HCL 4 MG/2ML IJ SOLN: 4.0000 mg | Freq: Four times a day (QID) | INTRAMUSCULAR | Status: DC | PRN

## 2011-12-12 MED ORDER — ASPIRIN 81 MG PO CHEW
324.0000 mg | CHEWABLE_TABLET | ORAL | Status: AC
  Administered 2011-12-12: 324 mg via ORAL

## 2011-12-12 MED ORDER — SODIUM CHLORIDE 0.9 % IV SOLN
1.0000 mL/kg/h | INTRAVENOUS | Status: DC
  Administered 2011-12-12: 1 mL/kg/h via INTRAVENOUS

## 2011-12-12 NOTE — OR Nursing (Signed)
Meal served 

## 2011-12-12 NOTE — Progress Notes (Signed)
Discharge instructions completed, patient voiced verbal understanding.  Discharged to home via wheelchair with husband.

## 2011-12-12 NOTE — Interval H&P Note (Signed)
History and Physical Interval Note:  12/12/2011 9:52 AM  Teresa Hart  has presented today for a cardiac cath with the diagnosis of abn stress test  The various methods of treatment have been discussed with the patient and family. After consideration of risks, benefits and other options for treatment, the patient has consented to  Procedure(s): JV LEFT HEART CATHETERIZATION WITH CORONARY ANGIOGRAM as a surgical intervention .  The patients' history has been reviewed, patient examined, no change in status, stable for surgery.  I have reviewed the patients' chart and labs.  Questions were answered to the patient's satisfaction.     Teresa Hart

## 2011-12-12 NOTE — Procedures (Signed)
Cardiac Catheterization Operative Report  Teresa Hart [id number] 1/24/201310:28 AM No primary provider on file.  Procedure Performed:  1. Left Heart Catheterization 2. Selective Coronary Angiography 3. Left ventricular angiogram  Operator: Verne Carrow, MD  Indication: Abnormal stress myoview.                                      Procedure Details: The risks, benefits, complications, treatment options, and expected outcomes were discussed with the patient. The patient and/or family concurred with the proposed plan, giving informed consent. The patient was brought to the outpatient cath lab after IV hydration was begun and oral premedication was given. The patient was further sedated with Versed and Fentanyl. The right groin was prepped and draped in the usual manner. Using the modified Seldinger access technique, a 4 French sheath was placed in the right femoral artery. Standard diagnostic catheters were used to perform selective coronary angiography. A pigtail catheter was used to measure left ventricular pressures.   There were no immediate complications. The patient was taken to the recovery area in stable condition.   Hemodynamic Findings: Central aortic pressure: 170/84 Left ventricular pressure:170/14/19  Angiographic Findings:  Left main: There are different ostia for the Circumflex and LAD  Left Anterior Descending Artery: Moderate sized vessel that courses to the apex. There is mild 20% plaque throughout the proximal and mid vessel. The distal vessel becomes very small in caliber (1.0-1.5 mm) and has a 70% stenosis. There are two small caliber diagonal branches without disease.   Circumflex Artery: Moderate sized vessel that gives off two obtuse marginal branches. The first OM branch is very small and has no disease. The second OM branch is a small caliber vessel (1.5 mm) and has diffuse 90% stenosis from the ostium down through the mid segment of the vessel.  This branch vessel is too small for consideration for PCI or grafting.   Right Coronary Artery: Large, dominant vessel. There is mild plaque throughout the vessel. The proximal and distal segments have diffuse 20% stenosis. The mid segment has 40% stenosis.   Left Ventricular Angiogram: Not performed secondary to renal insufficiency   Impression: 1. No major obstructive disease in the large caliber vessels.  2. There is disease in the small caliber OM2 branch and the small caliber distal LAD. Both vessels are too small for PCI or grafting.  3. No assessment of LV function today  Recommendations: I think medical management of her CAD is the best approach.        Complications:  None. The patient tolerated the procedure well.

## 2011-12-12 NOTE — H&P (View-Only) (Signed)
 HPI: 60-year-old female I saw in December of 2012 for evaluation of cardiac enlargement. Echocardiogram in December of 2012 showed normal LV function, probable mild aortic stenosis with a mean gradient of 12 mm of mercury, mild mitral regurgitation and mild left atrial enlargement. Carotid Dopplers in January of 2013 showed a 60-79% right and 40-59% left stenosis. Followup recommended in 6 months. Myoview in January of 2013 showed an ejection fraction of 61%. There is an inferolateral scar with mild ischemia. Since I last saw her, she has mild dyspnea on exertion but no orthopnea, PND, pedal edema, chest pain or syncope.  Current Outpatient Prescriptions  Medication Sig Dispense Refill  . albuterol (PROVENTIL HFA;VENTOLIN HFA) 108 (90 BASE) MCG/ACT inhaler Inhale 2 puffs into the lungs every 6 (six) hours as needed.      . ARIPiprazole (ABILIFY) 20 MG tablet Take 20 mg by mouth daily.        . aspirin EC 81 MG tablet Take 1 tablet (81 mg total) by mouth daily.  150 tablet  2  . diltiazem (TIAZAC) 360 MG 24 hr capsule Take 360 mg by mouth daily.        . DULoxetine (CYMBALTA) 60 MG capsule Take 60 mg by mouth daily.        . glimepiride (AMARYL) 4 MG tablet Take 4 mg by mouth 2 (two) times daily.        . liothyronine (CYTOMEL) 25 MCG tablet Take 25 mcg by mouth 2 (two) times daily.        . LORazepam (ATIVAN) 1 MG tablet Take 1 mg by mouth as needed.        . metFORMIN (GLUCOPHAGE) 1000 MG tablet Take 1,000 mg by mouth 2 (two) times daily with a meal.        . omeprazole (PRILOSEC) 20 MG capsule Take 20 mg by mouth 2 (two) times daily.        . pravastatin (PRAVACHOL) 40 MG tablet Take 40 mg by mouth daily.        . Vitamin D, Ergocalciferol, (DRISDOL) 50000 UNITS CAPS Take 50,000 Units by mouth every 7 (seven) days.         Past Medical History  Diagnosis Date  . HTN (hypertension)   . Hyperlipidemia   . GERD (gastroesophageal reflux disease)   . Obese   . Diabetes mellitus   .  Hypothyroid   . Asthma   . Renal insufficiency     Past Surgical History  Procedure Date  . Abdominal hysterectomy   . Tonsillectomy   . Knee surgery   . Appendectomy     History   Social History  . Marital Status: Married    Spouse Name: N/A    Number of Children: 0  . Years of Education: N/A   Occupational History  . Not on file.   Social History Main Topics  . Smoking status: Never Smoker   . Smokeless tobacco: Never Used  . Alcohol Use: No  . Drug Use: Not on file  . Sexually Active: Not on file   Other Topics Concern  . Not on file   Social History Narrative  . No narrative on file    ROS: no fevers or chills, productive cough, hemoptysis, dysphasia, odynophagia, melena, hematochezia, dysuria, hematuria, rash, seizure activity, orthopnea, PND, pedal edema, claudication. Remaining systems are negative.  Physical Exam: Well-developed well-nourished in no acute distress.  Skin is warm and dry.  HEENT is normal.  Neck is supple.   No thyromegaly.  Chest is clear to auscultation with normal expansion.  Cardiovascular exam is regular rate and rhythm. 2/6 systolic murmur left sternal border Abdominal exam nontender or distended. No masses palpated. Extremities show no edema. neuro grossly intact      

## 2011-12-12 NOTE — Progress Notes (Signed)
Bedrest begins @ 1050, tegaderm dressing applied to right groin site.  Dr. Clifton James in to discuss results with patient and family.

## 2011-12-13 ENCOUNTER — Other Ambulatory Visit (INDEPENDENT_AMBULATORY_CARE_PROVIDER_SITE_OTHER): Payer: Medicare Other | Admitting: *Deleted

## 2011-12-13 DIAGNOSIS — R9430 Abnormal result of cardiovascular function study, unspecified: Secondary | ICD-10-CM

## 2011-12-13 DIAGNOSIS — Z01810 Encounter for preprocedural cardiovascular examination: Secondary | ICD-10-CM

## 2011-12-13 LAB — BASIC METABOLIC PANEL
BUN: 14 mg/dL (ref 6–23)
CO2: 27 mEq/L (ref 19–32)
Chloride: 102 mEq/L (ref 96–112)
GFR: 50.74 mL/min — ABNORMAL LOW (ref >60.00)
Glucose, Bld: 270 mg/dL — ABNORMAL HIGH (ref 70–99)
Potassium: 5.4 mEq/L — ABNORMAL HIGH (ref 3.5–5.1)
Sodium: 136 mEq/L (ref 135–145)

## 2011-12-27 ENCOUNTER — Ambulatory Visit (INDEPENDENT_AMBULATORY_CARE_PROVIDER_SITE_OTHER): Payer: Medicare Other | Admitting: Nurse Practitioner

## 2011-12-27 ENCOUNTER — Ambulatory Visit: Payer: Medicare Other | Admitting: Physician Assistant

## 2011-12-27 ENCOUNTER — Encounter: Payer: Self-pay | Admitting: Nurse Practitioner

## 2011-12-27 VITALS — BP 138/68 | HR 92 | Ht 64.0 in | Wt 194.0 lb

## 2011-12-27 DIAGNOSIS — I359 Nonrheumatic aortic valve disorder, unspecified: Secondary | ICD-10-CM

## 2011-12-27 DIAGNOSIS — I679 Cerebrovascular disease, unspecified: Secondary | ICD-10-CM

## 2011-12-27 DIAGNOSIS — I2510 Atherosclerotic heart disease of native coronary artery without angina pectoris: Secondary | ICD-10-CM

## 2011-12-27 DIAGNOSIS — I350 Nonrheumatic aortic (valve) stenosis: Secondary | ICD-10-CM | POA: Insufficient documentation

## 2011-12-27 DIAGNOSIS — I10 Essential (primary) hypertension: Secondary | ICD-10-CM

## 2011-12-27 MED ORDER — NITROGLYCERIN 0.4 MG SL SUBL: 0.4000 mg | SUBLINGUAL_TABLET | SUBLINGUAL | Status: DC | PRN

## 2011-12-27 NOTE — Assessment & Plan Note (Signed)
Now s/p cath following abnormal Myoview. She is managed medically. I did send in a prescription for NTG sl for her to have on hand. We discussed how to use the NTG. She understands the need for risk factor modification. We will see her back in about 6 months. Patient is agreeable to this plan and will call if any problems develop in the interim.

## 2011-12-27 NOTE — Assessment & Plan Note (Signed)
Will be followed with echo. Currently with no cardinal symptoms.

## 2011-12-27 NOTE — Patient Instructions (Signed)
I have sent you a prescription for NTG to have on hand. Use your NTG under your tongue for recurrent chest pain. May take one tablet every 5 minutes. If you are still having discomfort after 3 tablets in 15 minutes, call 911.   We will see you in 6 months. Work on exercise/good blood sugar control/good blood pressure/weight.  Call the [facility] office at [phone] if you have any questions, problems or concerns.

## 2011-12-27 NOTE — Assessment & Plan Note (Signed)
Blood pressure is trending down with the addition of hydralazine.

## 2011-12-27 NOTE — Assessment & Plan Note (Signed)
To have repeat dopplers later this summer. Patient is aware.

## 2011-12-27 NOTE — Progress Notes (Signed)
Laterra Lubinski Sliva Date of Birth: 1950/11/22 Medical Record #[id number]  History of Present Illness: Ms. Taber is seen today for a work in visit. She is seen for Dr. Jens Som. She is a 61 year old female with mild aortic stenosis, DM, HTN and known carotid disease. Last echo was in December of 2012. Last carotids were in January of 2013. Last myoview in January of 2013 as well showing an EF of 61% with inferolateral scar and mild ischemia. Cardiac cath showed no major obstructive disease in the large caliber vessels, with disease in the small caliber OM2 branch and the distal LAD. These are both too small for PCI or grafting.   She comes back today. She is doing well. Has no real complaint. Has been started on Hydralazine for her blood pressure per Renal. Has already had follow up labs since her cath. Blood sugars are good. She will have some chest discomfort if she over exerts but it resolves quickly with rest. We did review her cath findings. She does not have NTG on hand. She is going to restart her water exercise program and wants to lose weight.   Current Outpatient Prescriptions on File Prior to Visit  Medication Sig Dispense Refill  . albuterol (PROVENTIL HFA;VENTOLIN HFA) 108 (90 BASE) MCG/ACT inhaler Inhale 2 puffs into the lungs every 6 (six) hours as needed.      . ARIPiprazole (ABILIFY) 20 MG tablet Take 20 mg by mouth daily.        Marland Kitchen aspirin EC 81 MG tablet Take 1 tablet (81 mg total) by mouth daily.  150 tablet  2  . diltiazem (TIAZAC) 360 MG 24 hr capsule Take 360 mg by mouth daily.        . DULoxetine (CYMBALTA) 60 MG capsule Take 60 mg by mouth 2 (two) times daily.       Marland Kitchen glimepiride (AMARYL) 4 MG tablet Take 4 mg by mouth 2 (two) times daily.        Marland Kitchen liothyronine (CYTOMEL) 25 MCG tablet Take 25 mcg by mouth 2 (two) times daily.        Marland Kitchen LORazepam (ATIVAN) 1 MG tablet Take 1 mg by mouth as needed.        . metFORMIN (GLUCOPHAGE) 1000 MG tablet Take 1,000 mg by mouth 2 (two)  times daily with a meal.        . omeprazole (PRILOSEC) 20 MG capsule Take 20 mg by mouth 2 (two) times daily.        . pravastatin (PRAVACHOL) 40 MG tablet Take 40 mg by mouth daily.        . Vitamin D, Ergocalciferol, (DRISDOL) 50000 UNITS CAPS Take 50,000 Units by mouth every 7 (seven) days.       Current Facility-Administered Medications on File Prior to Visit  Medication Dose Route Frequency Provider Last Rate Last Dose  . 0.9 %  sodium chloride infusion  250 mL Intravenous PRN Lewayne Bunting, MD      . sodium chloride 0.9 % injection 3 mL  3 mL Intravenous Q12H Lewayne Bunting, MD      . sodium chloride 0.9 % injection 3 mL  3 mL Intravenous PRN Lewayne Bunting, MD        Allergies  Allergen Reactions  . Augmentin Nausea Only  . Codeine     REACTION: nausea  . Penicillins     REACTION: major hives,swelling    Past Medical History  Diagnosis Date  .  HTN (hypertension)   . Hyperlipidemia   . GERD (gastroesophageal reflux disease)   . Obese   . Diabetes mellitus   . Hypothyroid   . Asthma   . Renal insufficiency     Past Surgical History  Procedure Date  . Abdominal hysterectomy   . Tonsillectomy   . Knee surgery   . Appendectomy     History  Smoking status  . Never Smoker   Smokeless tobacco  . Never Used    History  Alcohol Use No    Family History  Problem Relation Age of Onset  . Coronary artery disease Brother     CABG 21  . Coronary artery disease Brother     CABG 58    Review of Systems: The review of systems is per the HPI.  All other systems were reviewed and are negative.  Physical Exam: BP 138/68  Pulse 92  Ht 5\' 4"  (1.626 m)  Wt 194 lb (87.998 kg)  BMI 33.30 kg/m2 Patient is very pleasant and in no acute distress. She is obese. Skin is warm and dry. Color is normal.  HEENT is unremarkable. Normocephalic/atraumatic. PERRL. Sclera are nonicteric. Neck is supple. No masses. No JVD. Lungs are clear. Cardiac exam shows a regular rate  and rhythm. She has a soft outflow murmur. Abdomen is obese but soft. Extremities are without edema. Gait and ROM are intact. No gross neurologic deficits noted.  LABORATORY DATA:   Assessment / Plan:

## 2012-01-28 ENCOUNTER — Telehealth: Payer: Self-pay | Admitting: Family Medicine

## 2012-01-28 ENCOUNTER — Ambulatory Visit (INDEPENDENT_AMBULATORY_CARE_PROVIDER_SITE_OTHER): Payer: Medicare Other | Admitting: Internal Medicine

## 2012-01-28 ENCOUNTER — Ambulatory Visit: Payer: Medicare Other

## 2012-01-28 VITALS — BP 143/72 | HR 111 | Temp 98.6°F | Resp 18 | Ht 63.0 in | Wt 189.4 lb

## 2012-01-28 DIAGNOSIS — R05 Cough: Secondary | ICD-10-CM

## 2012-01-28 DIAGNOSIS — E039 Hypothyroidism, unspecified: Secondary | ICD-10-CM | POA: Insufficient documentation

## 2012-01-28 DIAGNOSIS — N189 Chronic kidney disease, unspecified: Secondary | ICD-10-CM

## 2012-01-28 DIAGNOSIS — R0609 Other forms of dyspnea: Secondary | ICD-10-CM

## 2012-01-28 LAB — POCT CBC
Granulocyte percent: 71.5 %G (ref 37–80)
MCV: 92.5 fL (ref 80–97)
MID (cbc): 0.6 (ref 0–0.9)
MPV: 8.1 fL (ref 0–99.8)
POC Granulocyte: 7.7 — AB (ref 2–6.9)
POC LYMPH PERCENT: 22.5 %L (ref 10–50)
POC MID %: 6 %M (ref 0–12)
Platelet Count, POC: 280 10*3/uL (ref 142–424)
RBC: 4.13 M/uL (ref 4.04–5.48)
RDW, POC: 13.6 %

## 2012-01-28 LAB — POCT GLYCOSYLATED HEMOGLOBIN (HGB A1C): Hemoglobin A1C: 8.2

## 2012-01-28 MED ORDER — PREDNISOLONE 15 MG/5ML PO SYRP: ORAL_SOLUTION | ORAL | Status: DC

## 2012-01-28 MED ORDER — IPRATROPIUM BROMIDE 0.02 % IN SOLN
0.5000 mg | Freq: Once | RESPIRATORY_TRACT | Status: AC
  Administered 2012-01-28: 0.5 mg via RESPIRATORY_TRACT

## 2012-01-28 MED ORDER — ALBUTEROL SULFATE (2.5 MG/3ML) 0.083% IN NEBU
2.5000 mg | INHALATION_SOLUTION | Freq: Once | RESPIRATORY_TRACT | Status: AC
  Administered 2012-01-28: 2.5 mg via RESPIRATORY_TRACT

## 2012-01-28 MED ORDER — HYDROCODONE-HOMATROPINE 5-1.5 MG/5ML PO SYRP: 5.0000 mL | ORAL_SOLUTION | Freq: Three times a day (TID) | ORAL | Status: AC | PRN

## 2012-01-28 MED ORDER — ALBUTEROL SULFATE HFA 108 (90 BASE) MCG/ACT IN AERS: 2.0000 | INHALATION_SPRAY | Freq: Four times a day (QID) | RESPIRATORY_TRACT | Status: DC | PRN

## 2012-01-28 MED ORDER — AZITHROMYCIN 250 MG PO TABS: ORAL_TABLET | ORAL | Status: AC

## 2012-01-28 NOTE — Telephone Encounter (Signed)
Patient called stating that rxs not at pharmacy... Per chart was sent to [facility] in error.  Was called into [facility] and pt there waiting.

## 2012-01-28 NOTE — Progress Notes (Signed)
  Subjective:    Patient ID: Teresa Hart, female    DOB: 01-16-1951, 61 y.o.   MRN: [id number]  Cough This is a new problem. The current episode started in the past 7 days. The problem has been gradually worsening. The problem occurs every few minutes. The cough is non-productive. Associated symptoms include ear pain, a fever, rhinorrhea and sweats. Pertinent negatives include no chills. Her past medical history is significant for pneumonia.  Infant has been ill for 5 days, has a bad cough and has felt feverish at times.  Some rhinitis and ear fullness at times.  She feels as she did when she had pneumonia about two years ago.  Her morning glucoses have been a bit higher the last few days.  No vomiting but lots of hard coughing.  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  Recent work-up by Dr. Jens Som.    Review of Systems  Constitutional: Positive for fever. Negative for chills.  HENT: Positive for ear pain, congestion and rhinorrhea. Negative for neck pain.   Eyes: Negative.   Respiratory: Positive for cough.   Cardiovascular: Negative.   Gastrointestinal: Negative.   Genitourinary: Negative.   Skin: Negative.   Neurological: Negative.   Psychiatric/Behavioral: Negative.        Objective:   Physical Exam  Vitals reviewed. Constitutional: She is oriented to person, place, and time. She appears well-developed and well-nourished.       Obese.  Coughs frequently during respiratory exam.  HENT:  Head: Normocephalic.  Mouth/Throat: Oropharynx is clear and moist. No oropharyngeal exudate.  Eyes: Conjunctivae are normal.  Neck: Neck supple. No tracheal deviation present.  Cardiovascular: Regular rhythm, normal heart sounds and intact distal pulses.        Sinus tachycardia noted.  112 sitting.  Lymphadenopathy:    She has no cervical adenopathy.  Neurological: She is alert and oriented to person, place, and time.  Skin:  Skin is warm and dry.  Psychiatric: She has a normal mood and affect. Her behavior is normal.   [facility] reading (PRIMARY) by  Dr. Perrin Maltese.  No acute infiltrates.  Likely bronchitis.         Assessment & Plan:  Bronchitis:  No signs of pneumonia though she has a slight increase in WBC's.  Azithromycin 250 mg #6, prelone 2 tsps for 3 days, and hycodan prescribed which has worked for her before.  She assures me she will return if not feeling much better after 2 days.  Sugars will go up with her prelone use.  AVS printed and given to pt.

## 2012-01-28 NOTE — Patient Instructions (Signed)
Take all you medication for bronchitis as prescribed.  RTC if not much improved in 2-3 days.  Bronchitis Bronchitis is the body's way of reacting to injury and/or infection (inflammation) of the bronchi. Bronchi are the air tubes that extend from the windpipe into the lungs. If the inflammation becomes severe, it may cause shortness of breath. CAUSES  Inflammation may be caused by:  A virus.   Germs (bacteria).   Dust.   Allergens.   Pollutants and many other irritants.  The cells lining the bronchial tree are covered with tiny hairs (cilia). These constantly beat upward, away from the lungs, toward the mouth. This keeps the lungs free of pollutants. When these cells become too irritated and are unable to do their job, mucus begins to develop. This causes the characteristic cough of bronchitis. The cough clears the lungs when the cilia are unable to do their job. Without either of these protective mechanisms, the mucus would settle in the lungs. Then you would develop pneumonia. Smoking is a common cause of bronchitis and can contribute to pneumonia. Stopping this habit is the single most important thing you can do to help yourself. TREATMENT   Your caregiver may prescribe an antibiotic if the cough is caused by bacteria. Also, medicines that open up your airways make it easier to breathe. Your caregiver may also recommend or prescribe an expectorant. It will loosen the mucus to be coughed up. Only take over-the-counter or prescription medicines for pain, discomfort, or fever as directed by your caregiver.   Removing whatever causes the problem (smoking, for example) is critical to preventing the problem from getting worse.   Cough suppressants may be prescribed for relief of cough symptoms.   Inhaled medicines may be prescribed to help with symptoms now and to help prevent problems from returning.   For those with recurrent (chronic) bronchitis, there may be a need for steroid medicines.   SEEK IMMEDIATE MEDICAL CARE IF:   During treatment, you develop more pus-like mucus (purulent sputum).   You have a fever.   Your baby is older than 3 months with a rectal temperature of 102 F (38.9 C) or higher.   Your baby is 82 months old or younger with a rectal temperature of 100.4 F (38 C) or higher.   You become progressively more ill.   You have increased difficulty breathing, wheezing, or shortness of breath.  It is necessary to seek immediate medical care if you are elderly or sick from any other disease. MAKE SURE YOU:   Understand these instructions.   Will watch your condition.   Will get help right away if you are not doing well or get worse.  Document Released: 11/04/2005 Document Revised: 10/24/2011 Document Reviewed: 09/13/2008 [facility] Patient Information 2012 [place], [place].

## 2012-02-25 ENCOUNTER — Encounter: Payer: Self-pay | Admitting: Cardiology

## 2012-04-17 ENCOUNTER — Telehealth: Payer: Self-pay | Admitting: Cardiology

## 2012-04-17 DIAGNOSIS — I350 Nonrheumatic aortic (valve) stenosis: Secondary | ICD-10-CM

## 2012-04-17 DIAGNOSIS — I679 Cerebrovascular disease, unspecified: Secondary | ICD-10-CM

## 2012-04-17 NOTE — Telephone Encounter (Signed)
Spoke with pt, carotid, echo and follow up appt with dr Jens Som scheduled.

## 2012-04-17 NOTE — Telephone Encounter (Signed)
Please return call to patient at [phone] regarding testing.

## 2012-04-27 ENCOUNTER — Ambulatory Visit (HOSPITAL_COMMUNITY): Payer: Medicare Other | Attending: Cardiology | Admitting: Radiology

## 2012-04-27 DIAGNOSIS — E119 Type 2 diabetes mellitus without complications: Secondary | ICD-10-CM | POA: Insufficient documentation

## 2012-04-27 DIAGNOSIS — Z8673 Personal history of transient ischemic attack (TIA), and cerebral infarction without residual deficits: Secondary | ICD-10-CM | POA: Insufficient documentation

## 2012-04-27 DIAGNOSIS — I10 Essential (primary) hypertension: Secondary | ICD-10-CM | POA: Insufficient documentation

## 2012-04-27 DIAGNOSIS — R011 Cardiac murmur, unspecified: Secondary | ICD-10-CM | POA: Insufficient documentation

## 2012-04-27 DIAGNOSIS — I350 Nonrheumatic aortic (valve) stenosis: Secondary | ICD-10-CM

## 2012-04-27 DIAGNOSIS — E785 Hyperlipidemia, unspecified: Secondary | ICD-10-CM | POA: Insufficient documentation

## 2012-04-27 DIAGNOSIS — I517 Cardiomegaly: Secondary | ICD-10-CM | POA: Insufficient documentation

## 2012-04-27 DIAGNOSIS — I359 Nonrheumatic aortic valve disorder, unspecified: Secondary | ICD-10-CM

## 2012-04-27 DIAGNOSIS — R0609 Other forms of dyspnea: Secondary | ICD-10-CM | POA: Insufficient documentation

## 2012-04-27 DIAGNOSIS — R0989 Other specified symptoms and signs involving the circulatory and respiratory systems: Secondary | ICD-10-CM | POA: Insufficient documentation

## 2012-04-27 NOTE — Progress Notes (Signed)
Echocardiogram performed.  

## 2012-05-08 ENCOUNTER — Encounter (INDEPENDENT_AMBULATORY_CARE_PROVIDER_SITE_OTHER): Payer: Medicare Other

## 2012-05-08 DIAGNOSIS — I6529 Occlusion and stenosis of unspecified carotid artery: Secondary | ICD-10-CM

## 2012-05-08 DIAGNOSIS — I679 Cerebrovascular disease, unspecified: Secondary | ICD-10-CM

## 2012-05-13 ENCOUNTER — Telehealth: Payer: Self-pay | Admitting: Cardiology

## 2012-05-13 NOTE — Telephone Encounter (Signed)
Left message of results of carotids for pt

## 2012-05-13 NOTE — Telephone Encounter (Signed)
New Problem: ° ° ° °Patient returned your call.  Please call back. °

## 2012-05-28 ENCOUNTER — Encounter: Payer: Medicare Other | Admitting: Cardiology

## 2012-05-28 NOTE — Progress Notes (Signed)
HPI: Pleasant female I saw in December of 2012 for evaluation of cardiac enlargement. Echocardiogram in June of 2013 showed normal LV function, grade 1 diastolic dysfunction, mild aortic stenosis with a mean gradient of 11 mm of mercury and mild left atrial enlargement. Carotid Dopplers in June of 2013 showed a 60-79% right and 40-59% left stenosis. Followup recommended in 6 months. Myoview in January of 2013 showed an ejection fraction of 61%. There is an inferolateral scar with mild ischemia. Cardiac catheterization in January of 2013 showed a 20% proximal LAD and a distal 70% lesion. There was a 90% small second obtuse marginal. There was a 20% proximal RCA and a 40% mid. There was felt to be no major obstructive disease in large caliber vessels and medical therapy recommended. Since she was last seen,    Current Outpatient Prescriptions  Medication Sig Dispense Refill  . albuterol (PROVENTIL HFA;VENTOLIN HFA) 108 (90 BASE) MCG/ACT inhaler Inhale 2 puffs into the lungs every 6 (six) hours as needed.  18 g  2  . ARIPiprazole (ABILIFY) 20 MG tablet Take 20 mg by mouth daily.        Marland Kitchen aspirin EC 81 MG tablet Take 1 tablet (81 mg total) by mouth daily.  150 tablet  2  . diltiazem (TIAZAC) 360 MG 24 hr capsule Take 360 mg by mouth daily.        . DULoxetine (CYMBALTA) 60 MG capsule Take 60 mg by mouth 2 (two) times daily.       Marland Kitchen glimepiride (AMARYL) 4 MG tablet Take 4 mg by mouth 2 (two) times daily.        . hydrALAZINE (APRESOLINE) 25 MG tablet Take 25 mg by mouth 2 (two) times daily.      Marland Kitchen liothyronine (CYTOMEL) 25 MCG tablet Take 25 mcg by mouth 2 (two) times daily.        Marland Kitchen LORazepam (ATIVAN) 1 MG tablet Take 1 mg by mouth as needed.        . metFORMIN (GLUCOPHAGE) 1000 MG tablet Take 1,000 mg by mouth 2 (two) times daily with a meal.        . nitroGLYCERIN (NITROSTAT) 0.4 MG SL tablet Place 1 tablet (0.4 mg total) under the tongue every 5 (five) minutes as needed for chest pain.  25 tablet   6  . omeprazole (PRILOSEC) 20 MG capsule Take 20 mg by mouth 2 (two) times daily.        . pravastatin (PRAVACHOL) 40 MG tablet Take 40 mg by mouth daily.        . prednisoLONE (PRELONE) 15 MG/5ML syrup Take two teaspoons daily for 3 days.  60 mL  0  . Vitamin D, Ergocalciferol, (DRISDOL) 50000 UNITS CAPS Take 50,000 Units by mouth every 7 (seven) days.       Current Facility-Administered Medications  Medication Dose Route Frequency Provider Last Rate Last Dose  . 0.9 %  sodium chloride infusion  250 mL Intravenous PRN Lewayne Bunting, MD      . sodium chloride 0.9 % injection 3 mL  3 mL Intravenous Q12H Lewayne Bunting, MD      . sodium chloride 0.9 % injection 3 mL  3 mL Intravenous PRN Lewayne Bunting, MD         Past Medical History  Diagnosis Date  . HTN (hypertension)   . Hyperlipidemia   . GERD (gastroesophageal reflux disease)   . Obese   . Diabetes mellitus   .  Hypothyroid   . Asthma   . Renal insufficiency   . Cardiac enlargement   . Aortic stenosis, mild     per echo Jan 2013; EF is 61%  . Carotid stenosis, bilateral     per doppler Jan 2013  . Abnormal nuclear stress test Jan 2013  . CAD (coronary artery disease) Jan 2013    with cardiac cath. Managed medically. No obstructive disease in the large caliber vessels, disease in the small OM2 and distal LAD; not amenable to PCI or grafting.     Past Surgical History  Procedure Date  . Abdominal hysterectomy   . Tonsillectomy   . Knee surgery   . Appendectomy   . Cardiac catheterization Jan 2013    No major obstructive disease in the large caliber vessels, with disease in a small OM2 and distal LAD; not amenable to PCI or grafting.     History   Social History  . Marital Status: Married    Spouse Name: N/A    Number of Children: 0  . Years of Education: N/A   Occupational History  . Not on file.   Social History Main Topics  . Smoking status: Never Smoker   . Smokeless tobacco: Never Used  . Alcohol  Use: No  . Drug Use: No  . Sexually Active: Yes   Other Topics Concern  . Not on file   Social History Narrative  . No narrative on file    ROS: no fevers or chills, productive cough, hemoptysis, dysphasia, odynophagia, melena, hematochezia, dysuria, hematuria, rash, seizure activity, orthopnea, PND, pedal edema, claudication. Remaining systems are negative.  Physical Exam: Well-developed well-nourished in no acute distress.  Skin is warm and dry.  HEENT is normal.  Neck is supple. No thyromegaly.  Chest is clear to auscultation with normal expansion.  Cardiovascular exam is regular rate and rhythm.  Abdominal exam nontender or distended. No masses palpated. Extremities show no edema. neuro grossly intact  ECG     This encounter was created in error - please disregard.

## 2012-06-11 ENCOUNTER — Encounter: Payer: Self-pay | Admitting: Family Medicine

## 2012-06-12 ENCOUNTER — Encounter: Payer: Self-pay | Admitting: Family Medicine

## 2012-06-18 NOTE — Progress Notes (Signed)
To come in for bmet-had cardiac work-up with cath 2/13-was good-no sob,cp since-resp good, has not used inhaler in 2 months. Has had knee surg here-did well To bring meds and walker

## 2012-06-19 ENCOUNTER — Encounter (HOSPITAL_BASED_OUTPATIENT_CLINIC_OR_DEPARTMENT_OTHER)
Admission: RE | Admit: 2012-06-19 | Discharge: 2012-06-19 | Disposition: A | Discharge status: Home / Self Care | Payer: Medicare Other | Source: Ambulatory Visit | Attending: Orthopedic Surgery | Admitting: Orthopedic Surgery

## 2012-06-19 ENCOUNTER — Ambulatory Visit (INDEPENDENT_AMBULATORY_CARE_PROVIDER_SITE_OTHER): Payer: Medicare Other | Admitting: Family Medicine

## 2012-06-19 VITALS — BP 138/76 | HR 86 | Temp 98.2°F | Resp 17 | Ht 64.0 in | Wt 187.0 lb

## 2012-06-19 DIAGNOSIS — E785 Hyperlipidemia, unspecified: Secondary | ICD-10-CM

## 2012-06-19 DIAGNOSIS — E119 Type 2 diabetes mellitus without complications: Secondary | ICD-10-CM

## 2012-06-19 DIAGNOSIS — N3941 Urge incontinence: Secondary | ICD-10-CM

## 2012-06-19 DIAGNOSIS — E039 Hypothyroidism, unspecified: Secondary | ICD-10-CM

## 2012-06-19 LAB — POCT URINALYSIS DIPSTICK
Bilirubin, UA: NEGATIVE
Glucose, UA: NEGATIVE
Ketones, UA: NEGATIVE

## 2012-06-19 LAB — BASIC METABOLIC PANEL
CO2: 26 mEq/L (ref 19–32)
Chloride: 99 mEq/L (ref 96–112)
GFR calc Af Amer: 53 mL/min — ABNORMAL LOW (ref >90)
Sodium: 136 mEq/L (ref 135–145)

## 2012-06-19 LAB — POCT UA - MICROSCOPIC ONLY

## 2012-06-19 LAB — POCT GLYCOSYLATED HEMOGLOBIN (HGB A1C): Hemoglobin A1C: 9.4

## 2012-06-19 MED ORDER — INSULIN GLARGINE 100 UNIT/ML ~~LOC~~ SOLN: SUBCUTANEOUS | Status: DC

## 2012-06-19 MED ORDER — NITROFURANTOIN MONOHYD MACRO 100 MG PO CAPS: 100.0000 mg | ORAL_CAPSULE | Freq: Two times a day (BID) | ORAL | Status: AC

## 2012-06-19 NOTE — Progress Notes (Addendum)
[facility] [address] [place] [address] [phone]  Date:  06/19/2012   Name:  Teresa Hart   DOB:  Oct 19, 1951   MRN:  [id number]  PCP:  Abbe Amsterdam, MD    Chief Complaint: Follow-up   History of Present Illness:  Teresa Hart is a 61 y.o. very pleasant female patient who presents with the following:  Multiple medical problems as below.  DM, HTN, CRF, CAD, aorthic stenosis.   I recently received a copy of blood work from her nephrologist showing elevated glucose.  Asked her to come in to see me to discuss. Hadasah actually was NOT fasting at that visit, but she is aware that her glucose control is not as good as it should be.  She has been on lantus in the past, but was able to stop this a couple of years ago when her DM was well controlled.      She will have a right knee scope on Wednesday- for a cartiledge tear. She also has carotid disease, but this is being observed.   She also notes issues with her bladder- she notes leakage with cough or sneeze, and also urge incontonence.  This has been going on for a few months. No pain with urination, no blood in her urine.  No vaginal symptoms.     Patient Active Problem List  Diagnosis  . Dyspnea  . Hypertension  . Hyperlipidemia  . Murmur  . Bruit  . Cardiac enlargement  . Nonspecific abnormal unspecified cardiovascular function study  . Cerebrovascular disease  . CAD (coronary artery disease)  . Aortic stenosis, mild  . Chronic renal failure  . Hypothyroid    Past Medical History  Diagnosis Date  . HTN (hypertension)   . Hyperlipidemia   . GERD (gastroesophageal reflux disease)   . Obese   . Diabetes mellitus   . Hypothyroid   . Asthma   . Renal insufficiency   . Cardiac enlargement   . Aortic stenosis, mild     per echo Jan 2013; EF is 61%  . Carotid stenosis, bilateral     per doppler Jan 2013  . Abnormal nuclear stress test Jan 2013  . CAD (coronary artery disease) Jan  2013    with cardiac cath. Managed medically. No obstructive disease in the large caliber vessels, disease in the small OM2 and distal LAD; not amenable to PCI or grafting.     Past Surgical History  Procedure Date  . Abdominal hysterectomy   . Tonsillectomy   . Knee surgery   . Appendectomy   . Cardiac catheterization Jan 2013    No major obstructive disease in the large caliber vessels, with disease in a small OM2 and distal LAD; not amenable to PCI or grafting.     History  Substance Use Topics  . Smoking status: Never Smoker   . Smokeless tobacco: Never Used  . Alcohol Use: No    Family History  Problem Relation Age of Onset  . Coronary artery disease Brother     CABG 71  . Coronary artery disease Brother     CABG 58    Allergies  Allergen Reactions  . Amoxicillin-Pot Clavulanate Nausea Only  . Codeine     REACTION: nausea  . Penicillins     REACTION: major hives,swelling    Medication list has been reviewed and updated.  Current Outpatient Prescriptions on File Prior to Visit  Medication Sig Dispense Refill  . albuterol (  PROVENTIL HFA;VENTOLIN HFA) 108 (90 BASE) MCG/ACT inhaler Inhale 2 puffs into the lungs every 6 (six) hours as needed.  18 g  2  . ARIPiprazole (ABILIFY) 20 MG tablet Take 20 mg by mouth daily.        Marland Kitchen aspirin EC 81 MG tablet Take 1 tablet (81 mg total) by mouth daily.  150 tablet  2  . diltiazem (TIAZAC) 360 MG 24 hr capsule Take 360 mg by mouth daily.        . DULoxetine (CYMBALTA) 60 MG capsule Take 60 mg by mouth 2 (two) times daily.       Marland Kitchen glimepiride (AMARYL) 4 MG tablet Take 4 mg by mouth 2 (two) times daily.        . hydrALAZINE (APRESOLINE) 25 MG tablet Take 25 mg by mouth 2 (two) times daily.      Marland Kitchen liothyronine (CYTOMEL) 25 MCG tablet Take 25 mcg by mouth 2 (two) times daily.        Marland Kitchen LORazepam (ATIVAN) 1 MG tablet Take 1 mg by mouth as needed.        . metFORMIN (GLUCOPHAGE) 1000 MG tablet Take 1,000 mg by mouth 2 (two) times  daily with a meal.        . nitroGLYCERIN (NITROSTAT) 0.4 MG SL tablet Place 1 tablet (0.4 mg total) under the tongue every 5 (five) minutes as needed for chest pain.  25 tablet  6  . omeprazole (PRILOSEC) 20 MG capsule Take 20 mg by mouth 2 (two) times daily.        . pravastatin (PRAVACHOL) 40 MG tablet Take 40 mg by mouth daily.         Current Facility-Administered Medications on File Prior to Visit  Medication Dose Route Frequency Provider Last Rate Last Dose  . sodium chloride 0.9 % injection 3 mL  3 mL Intravenous PRN Lewayne Bunting, MD      . DISCONTD: 0.9 %  sodium chloride infusion  250 mL Intravenous PRN Lewayne Bunting, MD      . DISCONTD: sodium chloride 0.9 % injection 3 mL  3 mL Intravenous Q12H Lewayne Bunting, MD        Review of Systems:  As per HPI- otherwise negative.   Physical Examination: Filed Vitals:   06/19/12 0802  BP: 138/76  Pulse: 86  Temp: 98.2 F (36.8 C)  Resp: 17   Filed Vitals:   06/19/12 0802  Height: 5\' 4"  (1.626 m)  Weight: 187 lb (84.823 kg)   Body mass index is 32.10 kg/(m^2). Ideal Body Weight: Weight in (lb) to have BMI = 25: 145.3   GEN: WDWN, NAD, Non-toxic, A & O x 3, obese HEENT: Atraumatic, Normocephalic. Neck supple. No masses, No LAD.  Oropharynx wnl, PEERL, EOMI Ears and Nose: No external deformity. CV: RRR, No M/G/R. No JVD. No thrill. No extra heart sounds. PULM: CTA B, no wheezes, crackles, rhonchi. No retractions. No resp. distress. No accessory muscle use. ABD: S, NT, ND, +BS. No rebound. No HSM. EXTR: No c/c/e.  Bilateral knees with crepitus NEURO Normal gait.  PSYCH: Normally interactive. Conversant. Not depressed or anxious appearing.  Calm demeanor.   Results for orders placed in visit on 06/19/12  POCT GLYCOSYLATED HEMOGLOBIN (HGB A1C)      Component Value Range   Hemoglobin A1C 9.4    POCT UA - MICROSCOPIC ONLY      Component Value Range   WBC, Ur, HPF, POC tntc  RBC, urine, microscopic 0-3      Bacteria, U Microscopic 2+     Mucus, UA neg     Epithelial cells, urine per micros 3-6     Crystals, Ur, HPF, POC neg     Casts, Ur, LPF, POC neg     Yeast, UA neg    POCT URINALYSIS DIPSTICK      Component Value Range   Color, UA yellow     Clarity, UA cloudy\     Glucose, UA neg     Bilirubin, UA neg     Ketones, UA neg     Spec Grav, UA 1.020     Blood, UA trace     pH, UA 6.0     Protein, UA 100     Urobilinogen, UA 0.2     Nitrite, UA neg     Leukocytes, UA small (1+)      Assessment and Plan: 1. DM type 2 (diabetes mellitus, type 2)  POCT glycosylated hemoglobin (Hb A1C), insulin glargine (LANTUS SOLOSTAR) 100 UNIT/ML injection  2. Hyperlipidemia  Lipid panel  3. Hypothyroidism  TSH  4. Urge incontinence  Urine culture, POCT UA - Microscopic Only, POCT urinalysis dipstick, nitrofurantoin, macrocrystal-monohydrate, (MACROBID) 100 MG capsule   Nalla is already on Glucophage and Amaryl. She is nearly maxed out on oral therapy.  She is ok with the idea of starting insulin again- she has used this in the past and knows how to use the solostar pen.  Will start at 10 units daily, and plan to increase slowly as long as her FBG remains elevated.  See pt instructions.   Urinary incontinence: may be due to a UTI- if her culture is negative and/ if she fails to improve will plan to start a medication such as ditropan  Will follow- up with her pending her urine culture- then plan to check back in about 2 months.     Will check her TSH and FLP today as well.  Other labs.   Fax thyroid results to Dr. Evelene Croon as she adjusts this medication  Abbe Amsterdam, MD  Called with other lab results.  GBS UTI- macrobid should treat.  Let me know if symptoms not better after completion of abx and we can add ditropan or similar.   Cholesterol is not well controlled- she is on 80 mg of pravachol.  Will change to crestor 20 mg at next refill. Plan to follow- up in 2 or 3 months.

## 2012-06-19 NOTE — Patient Instructions (Addendum)
Start back on Lantus- start at 10 units in the evening.  You can increase by 2 units every 2 or 3 days until your morning fasting glucose is under 150.  Please call me when you reach 20 units.

## 2012-06-20 LAB — TSH: TSH: 0.918 u[IU]/mL (ref 0.350–4.500)

## 2012-06-20 LAB — LIPID PANEL
HDL: 52 mg/dL (ref >39)
LDL Cholesterol: 145 mg/dL — ABNORMAL HIGH (ref 0–99)

## 2012-06-21 LAB — URINE CULTURE: Colony Count: >=100000

## 2012-06-22 MED ORDER — ROSUVASTATIN CALCIUM 20 MG PO TABS: 20.0000 mg | ORAL_TABLET | Freq: Every day | ORAL | Status: DC

## 2012-06-22 NOTE — Addendum Note (Signed)
Addended by: Abbe Amsterdam C on: 06/22/2012 05:34 PM   Modules accepted: Orders

## 2012-06-23 ENCOUNTER — Encounter: Payer: Self-pay | Admitting: Family Medicine

## 2012-06-23 NOTE — H&P (Signed)
  Subjective: Teresa Hart returns in followup for her right knee.  She is status post arthroscopy on 12/31/10 for removal of a fibrous band and debridement of grade 3 chondromalacia.  That was her third knee arthroscopy, and she reports significant pain relief until 6 weeks ago.  She denies any injury and reports that her pain returned gradually.  In the past, she has had cortisone injection and the Synvisc series with minimal improvement.  She complains of associated popping and catching.  She also complains of instability when descending stairs.  PAST  MEDICAL  HISTORY:  Hypertension, asthma, chronic kidney disease, depression and diabetes. She has high cholesterol.  PAST SURGICAL HISTORY:  Significant for 2 knee scopes.  ALLERGIES:  She has allergies to penicillin and Augmentin.  REVIEW   OF   SYSTEMS: Positive   for   glasses   and   headaches,   otherwise   negative   aside   from musculoskeletal complaints.  FAMILY HISTORY:  Positive for hypertension and heart disease.  SOCIAL HISTORY:  She denies use of tobacco or alcohol.  She is married and is on disability.  Objective: Exam of the right knee demonstrates significant pain with full extension and with flexion past 110.  She is tender to palpation along the medial and lateral joint lines.  No obvious warmth or effusion.  Neurovascular exam is within normal limits.  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  We have discussed the options with Ms. Spillane in detail today.  She has tried cortisone injection and Visco supplementation with minimal improvement.  The only thing that seems to help her knee pain is arthroscopy.  All risks and benefits of surgery are known to the patient and were discussed again today.  She will talk to Aldean Baker about  scheduling.

## 2012-06-24 ENCOUNTER — Encounter (HOSPITAL_BASED_OUTPATIENT_CLINIC_OR_DEPARTMENT_OTHER): Payer: Self-pay | Admitting: Anesthesiology

## 2012-06-24 ENCOUNTER — Ambulatory Visit (HOSPITAL_BASED_OUTPATIENT_CLINIC_OR_DEPARTMENT_OTHER)
Admission: RE | Admit: 2012-06-24 | Discharge: 2012-06-24 | Disposition: A | Discharge status: Home / Self Care | Payer: Medicare Other | Source: Ambulatory Visit | Attending: Orthopedic Surgery | Admitting: Orthopedic Surgery

## 2012-06-24 ENCOUNTER — Encounter (HOSPITAL_BASED_OUTPATIENT_CLINIC_OR_DEPARTMENT_OTHER): Payer: Self-pay | Admitting: *Deleted

## 2012-06-24 ENCOUNTER — Ambulatory Visit (HOSPITAL_BASED_OUTPATIENT_CLINIC_OR_DEPARTMENT_OTHER): Payer: Medicare Other | Admitting: Anesthesiology

## 2012-06-24 ENCOUNTER — Encounter (HOSPITAL_BASED_OUTPATIENT_CLINIC_OR_DEPARTMENT_OTHER)
Admission: RE | Disposition: A | Discharge status: Home / Self Care | Payer: Self-pay | Source: Ambulatory Visit | Attending: Orthopedic Surgery

## 2012-06-24 DIAGNOSIS — K219 Gastro-esophageal reflux disease without esophagitis: Secondary | ICD-10-CM | POA: Insufficient documentation

## 2012-06-24 DIAGNOSIS — I2510 Atherosclerotic heart disease of native coronary artery without angina pectoris: Secondary | ICD-10-CM | POA: Insufficient documentation

## 2012-06-24 DIAGNOSIS — M94261 Chondromalacia, right knee: Secondary | ICD-10-CM

## 2012-06-24 DIAGNOSIS — Z794 Long term (current) use of insulin: Secondary | ICD-10-CM | POA: Insufficient documentation

## 2012-06-24 DIAGNOSIS — J45909 Unspecified asthma, uncomplicated: Secondary | ICD-10-CM | POA: Insufficient documentation

## 2012-06-24 DIAGNOSIS — M2340 Loose body in knee, unspecified knee: Secondary | ICD-10-CM | POA: Insufficient documentation

## 2012-06-24 DIAGNOSIS — I359 Nonrheumatic aortic valve disorder, unspecified: Secondary | ICD-10-CM | POA: Insufficient documentation

## 2012-06-24 DIAGNOSIS — I10 Essential (primary) hypertension: Secondary | ICD-10-CM | POA: Insufficient documentation

## 2012-06-24 DIAGNOSIS — M2240 Chondromalacia patellae, unspecified knee: Secondary | ICD-10-CM | POA: Insufficient documentation

## 2012-06-24 DIAGNOSIS — E119 Type 2 diabetes mellitus without complications: Secondary | ICD-10-CM | POA: Insufficient documentation

## 2012-06-24 DIAGNOSIS — E039 Hypothyroidism, unspecified: Secondary | ICD-10-CM | POA: Insufficient documentation

## 2012-06-24 HISTORY — PX: KNEE ARTHROSCOPY: SHX127

## 2012-06-24 LAB — GLUCOSE, CAPILLARY: Glucose-Capillary: 187 mg/dL — ABNORMAL HIGH (ref 70–99)

## 2012-06-24 SURGERY — ARTHROSCOPY, KNEE
Anesthesia: General | Site: Knee | Laterality: Right | Wound class: Clean

## 2012-06-24 MED ORDER — FENTANYL CITRATE 0.05 MG/ML IJ SOLN
INTRAMUSCULAR | Status: DC | PRN
  Administered 2012-06-24: 50 ug via INTRAVENOUS

## 2012-06-24 MED ORDER — METOCLOPRAMIDE HCL 5 MG/ML IJ SOLN
INTRAMUSCULAR | Status: DC | PRN
  Administered 2012-06-24: 10 mg via INTRAVENOUS

## 2012-06-24 MED ORDER — DEXAMETHASONE SODIUM PHOSPHATE 4 MG/ML IJ SOLN
INTRAMUSCULAR | Status: DC | PRN
  Administered 2012-06-24: 4 mg via INTRAVENOUS

## 2012-06-24 MED ORDER — MIDAZOLAM HCL 5 MG/5ML IJ SOLN
INTRAMUSCULAR | Status: DC | PRN
  Administered 2012-06-24: 2 mg via INTRAVENOUS

## 2012-06-24 MED ORDER — EPINEPHRINE HCL 1 MG/ML IJ SOLN
INTRAMUSCULAR | Status: DC | PRN
  Administered 2012-06-24: 2 mg

## 2012-06-24 MED ORDER — LIDOCAINE HCL (CARDIAC) 20 MG/ML IV SOLN
INTRAVENOUS | Status: DC | PRN
  Administered 2012-06-24: 50 mg via INTRAVENOUS

## 2012-06-24 MED ORDER — LACTATED RINGERS IV SOLN
INTRAVENOUS | Status: DC
  Administered 2012-06-24: 11:00:00 via INTRAVENOUS

## 2012-06-24 MED ORDER — PROPOFOL 10 MG/ML IV EMUL
INTRAVENOUS | Status: DC | PRN
  Administered 2012-06-24: 50 mg via INTRAVENOUS
  Administered 2012-06-24: 200 mg via INTRAVENOUS

## 2012-06-24 MED ORDER — BUPIVACAINE-EPINEPHRINE 0.5% -1:200000 IJ SOLN
INTRAMUSCULAR | Status: DC | PRN
  Administered 2012-06-24: 30 mL

## 2012-06-24 MED ORDER — SODIUM CHLORIDE 0.9 % IR SOLN
Status: DC | PRN
  Administered 2012-06-24: 6000 mL

## 2012-06-24 MED ORDER — HYDROCODONE-ACETAMINOPHEN 5-325 MG PO TABS
1.0000 | ORAL_TABLET | ORAL | Status: AC | PRN
Start: 2012-06-24 — End: 2012-07-04

## 2012-06-24 MED ORDER — BUPIVACAINE HCL (PF) 0.5 % IJ SOLN: INTRAMUSCULAR | Status: DC | PRN

## 2012-06-24 MED ORDER — ONDANSETRON HCL 4 MG/2ML IJ SOLN
INTRAMUSCULAR | Status: DC | PRN
  Administered 2012-06-24: 4 mg via INTRAVENOUS

## 2012-06-24 SURGICAL SUPPLY — 40 items
BANDAGE ELASTIC 6 VELCRO ST LF (GAUZE/BANDAGES/DRESSINGS) ×2 IMPLANT
BLADE 4.2CUDA (BLADE) IMPLANT
BLADE CUTTER GATOR 3.5 (BLADE) ×2 IMPLANT
BLADE GREAT WHITE 4.2 (BLADE) IMPLANT
CANISTER OMNI JUG 16 LITER (MISCELLANEOUS) ×2 IMPLANT
CANISTER SUCTION 2500CC (MISCELLANEOUS) IMPLANT
CHLORAPREP W/TINT 26ML (MISCELLANEOUS) ×2 IMPLANT
CLOTH BEACON ORANGE TIMEOUT ST (SAFETY) ×2 IMPLANT
DRAPE ARTHROSCOPY W/POUCH 114 (DRAPES) ×2 IMPLANT
ELECT MENISCUS 165MM 90D (ELECTRODE) IMPLANT
ELECT REM PT RETURN 9FT ADLT (ELECTROSURGICAL)
ELECTRODE REM PT RTRN 9FT ADLT (ELECTROSURGICAL) IMPLANT
GAUZE XEROFORM 1X8 LF (GAUZE/BANDAGES/DRESSINGS) ×2 IMPLANT
GLOVE BIO SURGEON STRL SZ 6.5 (GLOVE) ×4 IMPLANT
GLOVE BIO SURGEON STRL SZ7 (GLOVE) ×2 IMPLANT
GLOVE BIO SURGEON STRL SZ7.5 (GLOVE) ×2 IMPLANT
GLOVE BIOGEL PI IND STRL 6.5 (GLOVE) ×1 IMPLANT
GLOVE BIOGEL PI IND STRL 7.0 (GLOVE) ×1 IMPLANT
GLOVE BIOGEL PI IND STRL 8 (GLOVE) ×1 IMPLANT
GLOVE BIOGEL PI INDICATOR 6.5 (GLOVE) ×1
GLOVE BIOGEL PI INDICATOR 7.0 (GLOVE) ×1
GLOVE BIOGEL PI INDICATOR 8 (GLOVE) ×1
GOWN PREVENTION PLUS XLARGE (GOWN DISPOSABLE) ×6 IMPLANT
KNEE WRAP E Z 3 GEL PACK (MISCELLANEOUS) ×2 IMPLANT
NDL SAFETY ECLIPSE 18X1.5 (NEEDLE) ×1 IMPLANT
NEEDLE FILTER BLUNT 18X 1/2SAF (NEEDLE) ×1
NEEDLE FILTER BLUNT 18X1 1/2 (NEEDLE) ×1 IMPLANT
NEEDLE HYPO 18GX1.5 SHARP (NEEDLE) ×1
PACK ARTHROSCOPY DSU (CUSTOM PROCEDURE TRAY) ×2 IMPLANT
PACK BASIN DAY SURGERY FS (CUSTOM PROCEDURE TRAY) ×2 IMPLANT
PENCIL BUTTON HOLSTER BLD 10FT (ELECTRODE) IMPLANT
SET ARTHROSCOPY TUBING (MISCELLANEOUS) ×1
SET ARTHROSCOPY TUBING LN (MISCELLANEOUS) ×1 IMPLANT
SLEEVE SCD COMPRESS KNEE MED (MISCELLANEOUS) IMPLANT
SPONGE GAUZE 4X4 12PLY (GAUZE/BANDAGES/DRESSINGS) ×2 IMPLANT
SYR 3ML 18GX1 1/2 (SYRINGE) IMPLANT
SYR 5ML LL (SYRINGE) ×4 IMPLANT
TOWEL OR 17X24 6PK STRL BLUE (TOWEL DISPOSABLE) ×2 IMPLANT
WAND STAR VAC 90 (SURGICAL WAND) IMPLANT
WATER STERILE IRR 1000ML POUR (IV SOLUTION) ×2 IMPLANT

## 2012-06-24 NOTE — Anesthesia Postprocedure Evaluation (Signed)
Anesthesia Post Note  Patient: Teresa Hart  Procedure(s) Performed: Procedure(s) (LRB): ARTHROSCOPY KNEE (Right)  Anesthesia type: General  Patient location: PACU  Post pain: Pain level controlled  Post assessment: Patient's Cardiovascular Status Stable  Last Vitals:  Filed Vitals:   06/24/12 1345  BP: 136/65  Pulse: 89  Temp:   Resp: 20    Post vital signs: Reviewed and stable  Level of consciousness: alert  Complications: No apparent anesthesia complications

## 2012-06-24 NOTE — Progress Notes (Signed)
Pt iv dressing site bleeding after pt. Up with walker.  New dressing applied, and pressure held.  Instructed pt to perform warm compresses at home if needed to site for soreness.  Pt verblaized understanding.

## 2012-06-24 NOTE — Op Note (Signed)
Pre-Op Dx: Chondromalacia of the right knee with flap tears of the patella  Postop Dx: Same   Procedure: Right knee arthroscopic debridement chondromalacia with flap tears from the apex and medial facet of the patella, medial tibial plateau. We also removed some small cartilaginous loose bodies.  Surgeon: Feliberto Gottron. Turner Daniels M.D.  Assist: Shirl Harris PA-C  Anes: General LMA  EBL: Minimal  Fluids: 800 cc   Indications: Patient has had 3 prior right knee arthroscopies over the last 6 or 7 years. Since last arthroscopy 2 years ago she is gradually developed more catching popping and pain, plain x-rays show mild loss of articular cartilage she desires elective arthroscopic evaluation and treatment in the hopes that gives her another 2 years worth of pain relief at a minimum. Pt has failed conservative treatment with anti-inflammatory medicines, physical therapy, and modified activites but did get good temporarily from an intra-articular cortisone injection. Pain has recurred and patient desires elective arthroscopic evaluation and treatment of knee. Risks and benefits of surgery have been discussed and questions answered.  Procedure: Patient identified by arm band and taken to the operating room at the day surgery Center. The appropriate anesthetic monitors were attached, and General LMA anesthesia was induced without difficulty. Lateral post was applied to the table and the lower extremity was prepped and draped in usual sterile fashion from the ankle to the midthigh. Time out procedure was performed. We began the operation by making standard inferior lateral and inferior medial peripatellar portals with a #11 blade allowing introduction of the arthroscope through the inferior lateral portal and the out flow to the inferior medial portal. Pump pressure was set at 100 mmHg and diagnostic arthroscopy  revealed grade 3 chondromalacia flap tears to the apex and medial facet of the patella. These are  debridement with a 3.5 mm Gator sucker shaver. Moving into the medial compartment we identified grade 2-3 chondromalacia of the medial tibial plateau that was likewise debrided. The medial meniscus was intact and was probed to verify integrity. The anterior cruciate ligament and PCL are intact. Lateral compartment the articular and meniscal cartilages were in excellent condition. Small cartilaginous loose bodies were taken through the outflow during the procedure, and were probably from the patellar chondromalacia.the gutters were cleared medially and laterally. The knee was irrigated out normal saline solution. A dressing of xerofoam 4 x 4 dressing sponges, web roll and an Ace wrap was applied. The patient was awakened extubated and taken to the recovery without difficulty.    Signed: Nestor Lewandowsky, MD

## 2012-06-24 NOTE — Anesthesia Procedure Notes (Signed)
Procedure Name: LMA Insertion Date/Time: 06/24/2012 12:26 PM Performed by: Caren Macadam Pre-anesthesia Checklist: Patient identified, Emergency Drugs available, Suction available and Patient being monitored Patient Re-evaluated:Patient Re-evaluated prior to inductionOxygen Delivery Method: Circle System Utilized Preoxygenation: Pre-oxygenation with 100% oxygen Intubation Type: IV induction Ventilation: Mask ventilation without difficulty LMA: LMA with gastric port inserted LMA Size: 4.0 Number of attempts: 1 Placement Confirmation: positive ETCO2 Tube secured with: Tape Dental Injury: Teeth and Oropharynx as per pre-operative assessment

## 2012-06-24 NOTE — Anesthesia Preprocedure Evaluation (Signed)
Anesthesia Evaluation  Patient identified by MRN, date of birth, ID band Patient awake    Reviewed: Allergy & Precautions, H&P , NPO status , Patient's Chart, lab work & pertinent test results, reviewed documented beta blocker date and time   Airway Mallampati: II TM Distance: >3 FB Neck ROM: full    Dental   Pulmonary shortness of breath and with exertion, asthma ,  breath sounds clear to auscultation        Cardiovascular hypertension, Pt. on medications + CAD + Valvular Problems/Murmurs AS Rhythm:regular     Neuro/Psych negative neurological ROS  negative psych ROS   GI/Hepatic negative GI ROS, Neg liver ROS, GERD-  Medicated and Controlled,  Endo/Other  negative endocrine ROSInsulin Dependent and Oral Hypoglycemic AgentsHypothyroidism   Renal/GU negative Renal ROS  negative genitourinary   Musculoskeletal   Abdominal   Peds  Hematology negative hematology ROS (+)   Anesthesia Other Findings See surgeon's H&P   Reproductive/Obstetrics negative OB ROS                           Anesthesia Physical Anesthesia Plan  ASA: III  Anesthesia Plan: General   Post-op Pain Management:    Induction: Intravenous  Airway Management Planned: LMA  Additional Equipment:   Intra-op Plan:   Post-operative Plan: Extubation in OR  Informed Consent: I have reviewed the patients History and Physical, chart, labs and discussed the procedure including the risks, benefits and alternatives for the proposed anesthesia with the patient or authorized representative who has indicated his/her understanding and acceptance.   Dental Advisory Given  Plan Discussed with: CRNA and Surgeon  Anesthesia Plan Comments:         Anesthesia Quick Evaluation

## 2012-06-24 NOTE — Transfer of Care (Signed)
Immediate Anesthesia Transfer of Care Note  Patient: Teresa Hart  Procedure(s) Performed: Procedure(s) (LRB): ARTHROSCOPY KNEE (Right)  Patient Location: PACU  Anesthesia Type: General  Level of Consciousness: sedated  Airway & Oxygen Therapy: Patient Spontanous Breathing and Patient connected to face mask oxygen  Post-op Assessment: Report given to PACU RN and Post -op Vital signs reviewed and stable  Post vital signs: Reviewed and stable  Complications: No apparent anesthesia complications

## 2012-06-24 NOTE — Interval H&P Note (Signed)
History and Physical Interval Note:  06/24/2012 12:02 PM  Teresa Hart  has presented today for surgery, with the diagnosis of RIGHT KNEE CHONDROMALACIA  The various methods of treatment have been discussed with the patient and family. After consideration of risks, benefits and other options for treatment, the patient has consented to  Procedure(s) (LRB): ARTHROSCOPY KNEE (Right) as a surgical intervention .  The patient's history has been reviewed, patient examined, no change in status, stable for surgery.  I have reviewed the patient's chart and labs.  Questions were answered to the patient's satisfaction.     Nestor Lewandowsky

## 2012-06-26 ENCOUNTER — Encounter (HOSPITAL_BASED_OUTPATIENT_CLINIC_OR_DEPARTMENT_OTHER): Payer: Self-pay | Admitting: Orthopedic Surgery

## 2012-06-29 ENCOUNTER — Encounter (HOSPITAL_BASED_OUTPATIENT_CLINIC_OR_DEPARTMENT_OTHER): Payer: Self-pay

## 2012-07-21 ENCOUNTER — Encounter: Payer: Medicare Other | Admitting: Cardiology

## 2012-07-21 NOTE — Progress Notes (Signed)
HPI: Pleasant female I saw in December of 2012 for evaluation of cardiac enlargement. Echocardiogram in June of 2013 showed normal LV function, probable mild aortic stenosis with a mean gradient of 11 mm of mercury and mild left atrial enlargement. Carotid Dopplers in June of 2013 showed a 60-79% right and 40-59% left stenosis. Followup recommended in 6 months. Myoview in January of 2013 showed an ejection fraction of 61%. There is an inferolateral scar with mild ischemia. Cardiac cath in Jan 2013 showed  mild 20% plaque throughout the proximal and mid LAD. The distal vessel becomes very small in caliber (1.0-1.5 mm) and has a 70% stenosis. The first OM branch is very small and has no disease. The second OM branch is a small caliber vessel (1.5 mm) and has diffuse 90% stenosis from the ostium down through the mid segment of the vessel. This branch vessel is too small for consideration for PCI or grafting. The proximal and distal segments of the RCA have diffuse 20% stenosis. The mid segment has 40% stenosis. Medical therapy recommended. Since I last saw her, she has mild dyspnea on exertion but no orthopnea, PND, pedal edema, chest pain or syncope.   Current Outpatient Prescriptions  Medication Sig Dispense Refill  . albuterol (PROVENTIL HFA;VENTOLIN HFA) 108 (90 BASE) MCG/ACT inhaler Inhale 2 puffs into the lungs every 6 (six) hours as needed.  18 g  2  . ARIPiprazole (ABILIFY) 20 MG tablet Take 20 mg by mouth daily.        Marland Kitchen aspirin EC 81 MG tablet Take 1 tablet (81 mg total) by mouth daily.  150 tablet  2  . diltiazem (TIAZAC) 360 MG 24 hr capsule Take 360 mg by mouth daily.        . DULoxetine (CYMBALTA) 60 MG capsule Take 60 mg by mouth 2 (two) times daily.       Marland Kitchen glimepiride (AMARYL) 4 MG tablet Take 4 mg by mouth 2 (two) times daily.        . hydrALAZINE (APRESOLINE) 25 MG tablet Take 25 mg by mouth 2 (two) times daily.      . insulin glargine (LANTUS SOLOSTAR) 100 UNIT/ML injection Start at  10 units every evening.  Increase according to guidelines  5 pen  PRN  . liothyronine (CYTOMEL) 25 MCG tablet Take 25 mcg by mouth 2 (two) times daily.        Marland Kitchen LORazepam (ATIVAN) 1 MG tablet Take 1 mg by mouth as needed.        . metFORMIN (GLUCOPHAGE) 1000 MG tablet Take 1,000 mg by mouth 2 (two) times daily with a meal.        . nitroGLYCERIN (NITROSTAT) 0.4 MG SL tablet Place 1 tablet (0.4 mg total) under the tongue every 5 (five) minutes as needed for chest pain.  25 tablet  6  . omeprazole (PRILOSEC) 20 MG capsule Take 20 mg by mouth 2 (two) times daily.        . rosuvastatin (CRESTOR) 20 MG tablet Take 1 tablet (20 mg total) by mouth daily.  90 tablet  3     Past Medical History  Diagnosis Date  . HTN (hypertension)   . Hyperlipidemia   . GERD (gastroesophageal reflux disease)   . Obese   . Diabetes mellitus   . Hypothyroid   . Asthma   . Renal insufficiency   . Cardiac enlargement   . Aortic stenosis, mild     per echo Jan 2013; EF is  61%  . Carotid stenosis, bilateral     per doppler Jan 2013  . Abnormal nuclear stress test Jan 2013  . CAD (coronary artery disease) Jan 2013    with cardiac cath. Managed medically. No obstructive disease in the large caliber vessels, disease in the small OM2 and distal LAD; not amenable to PCI or grafting.     Past Surgical History  Procedure Date  . Abdominal hysterectomy   . Tonsillectomy   . Knee surgery   . Appendectomy   . Cardiac catheterization Jan 2013    No major obstructive disease in the large caliber vessels, with disease in a small OM2 and distal LAD; not amenable to PCI or grafting.   . Knee arthroscopy 06/24/2012    Procedure: ARTHROSCOPY KNEE;  Surgeon: Nestor Lewandowsky, MD;  Location: [facility];  Service: Orthopedics;  Laterality: Right;  DEBRIDEMENT OF CHONDROMALACIA    History   Social History  . Marital Status: Married    Spouse Name: N/A    Number of Children: 0  . Years of Education: N/A    Occupational History  . Not on file.   Social History Main Topics  . Smoking status: Never Smoker   . Smokeless tobacco: Never Used  . Alcohol Use: No  . Drug Use: No  . Sexually Active: Yes   Other Topics Concern  . Not on file   Social History Narrative  . No narrative on file    ROS: no fevers or chills, productive cough, hemoptysis, dysphasia, odynophagia, melena, hematochezia, dysuria, hematuria, rash, seizure activity, orthopnea, PND, pedal edema, claudication. Remaining systems are negative.  Physical Exam: Well-developed well-nourished in no acute distress.  Skin is warm and dry.  HEENT is normal.  Neck is supple. No thyromegaly.  Chest is clear to auscultation with normal expansion.  Cardiovascular exam is regular rate and rhythm.  Abdominal exam nontender or distended. No masses palpated. Extremities show no edema. neuro grossly intact  ECG     This encounter was created in error - please disregard.

## 2012-07-23 ENCOUNTER — Telehealth: Payer: Self-pay

## 2012-07-23 NOTE — Telephone Encounter (Signed)
It would be a good idea for her to come in.  We need to check her urine and send it for culture.

## 2012-07-23 NOTE — Telephone Encounter (Signed)
I have called her to advise and she will come in for this.

## 2012-07-23 NOTE — Telephone Encounter (Signed)
Pt said when she was in last she had bladder infection, and the doctor told her if she had repeat to let her know and get her more medication   [phone]

## 2012-07-23 NOTE — Telephone Encounter (Signed)
I do not see mention of this in the chart, called patient ?whom did she see? she states she was seen by Dr Patsy Lager. Patient has had recent knee surgery and will have trouble coming in she wants to know if she can get ABX for this without being seen, please advise. I told her we may need to have her come in for urine culture.

## 2012-08-05 ENCOUNTER — Ambulatory Visit (INDEPENDENT_AMBULATORY_CARE_PROVIDER_SITE_OTHER): Payer: Medicare Other

## 2012-08-05 DIAGNOSIS — Z23 Encounter for immunization: Secondary | ICD-10-CM

## 2012-10-13 ENCOUNTER — Other Ambulatory Visit: Payer: Self-pay | Admitting: *Deleted

## 2012-10-13 MED ORDER — GLIMEPIRIDE 4 MG PO TABS: 4.0000 mg | ORAL_TABLET | Freq: Two times a day (BID) | ORAL | Status: DC

## 2012-10-13 MED ORDER — METFORMIN HCL 1000 MG PO TABS: 1000.0000 mg | ORAL_TABLET | Freq: Two times a day (BID) | ORAL | Status: DC

## 2012-11-16 ENCOUNTER — Ambulatory Visit (INDEPENDENT_AMBULATORY_CARE_PROVIDER_SITE_OTHER): Payer: Medicare Other | Admitting: Family Medicine

## 2012-11-16 VITALS — BP 131/74 | HR 83 | Temp 97.4°F | Resp 16 | Ht 64.0 in | Wt 177.0 lb

## 2012-11-16 DIAGNOSIS — E785 Hyperlipidemia, unspecified: Secondary | ICD-10-CM

## 2012-11-16 DIAGNOSIS — E119 Type 2 diabetes mellitus without complications: Secondary | ICD-10-CM

## 2012-11-16 DIAGNOSIS — J45909 Unspecified asthma, uncomplicated: Secondary | ICD-10-CM

## 2012-11-16 DIAGNOSIS — K219 Gastro-esophageal reflux disease without esophagitis: Secondary | ICD-10-CM

## 2012-11-16 LAB — POCT CBC
HCT, POC: 38.6 % (ref 37.7–47.9)
Hemoglobin: 12.2 g/dL (ref 12.2–16.2)
Lymph, poc: 1.9 (ref 0.6–3.4)
MCH, POC: 29.7 pg (ref 27–31.2)
MCHC: 31.6 g/dL — AB (ref 31.8–35.4)
RBC: 4.11 M/uL (ref 4.04–5.48)
WBC: 8.5 10*3/uL (ref 4.6–10.2)

## 2012-11-16 LAB — POCT URINALYSIS DIPSTICK
Bilirubin, UA: NEGATIVE
Ketones, UA: NEGATIVE
Spec Grav, UA: <=1.005

## 2012-11-16 LAB — LIPID PANEL
Total CHOL/HDL Ratio: 6.8 Ratio
VLDL: 43 mg/dL — ABNORMAL HIGH (ref 0–40)

## 2012-11-16 LAB — COMPREHENSIVE METABOLIC PANEL
ALT: 16 U/L (ref 0–35)
Alkaline Phosphatase: 113 U/L (ref 39–117)
CO2: 26 mEq/L (ref 19–32)
Creat: 1.14 mg/dL — ABNORMAL HIGH (ref 0.50–1.10)
Sodium: 138 mEq/L (ref 135–145)
Total Bilirubin: 0.4 mg/dL (ref 0.3–1.2)
Total Protein: 7 g/dL (ref 6.0–8.3)

## 2012-11-16 LAB — POCT UA - MICROSCOPIC ONLY
Bacteria, U Microscopic: NEGATIVE
Mucus, UA: NEGATIVE

## 2012-11-16 MED ORDER — ROSUVASTATIN CALCIUM 20 MG PO TABS: 20.0000 mg | ORAL_TABLET | Freq: Every day | ORAL | Status: DC

## 2012-11-16 MED ORDER — OMEPRAZOLE 20 MG PO CPDR: 20.0000 mg | DELAYED_RELEASE_CAPSULE | Freq: Two times a day (BID) | ORAL | Status: DC

## 2012-11-16 MED ORDER — ALBUTEROL SULFATE HFA 108 (90 BASE) MCG/ACT IN AERS: 2.0000 | INHALATION_SPRAY | Freq: Four times a day (QID) | RESPIRATORY_TRACT | Status: AC | PRN

## 2012-11-16 MED ORDER — METFORMIN HCL 1000 MG PO TABS: 1000.0000 mg | ORAL_TABLET | Freq: Two times a day (BID) | ORAL | Status: DC

## 2012-11-16 MED ORDER — INSULIN GLARGINE 100 UNIT/ML ~~LOC~~ SOLN: SUBCUTANEOUS | Status: DC

## 2012-11-16 MED ORDER — GLIMEPIRIDE 4 MG PO TABS: 4.0000 mg | ORAL_TABLET | Freq: Two times a day (BID) | ORAL | Status: DC

## 2012-11-16 NOTE — Progress Notes (Signed)
[facility] [address], [place] [address] [phone]  Date:  11/16/2012   Name:  Teresa Hart   DOB:  1951/03/22   MRN:  [id number]  PCP:  Abbe Amsterdam, MD    Chief Complaint: Follow-up, Follow-up and Medication Refill   History of Present Illness:  Teresa Hart is a 61 y.o. very pleasant female patient who presents with the following:  Here in August and noted to have an A1c of 9.4%- we put her back on Lantus as she was already on Glucophage and amaryl.  She is fasting today.   She is on 18 units of lantus currently,  Her am glucose is running around 150.   Her annual flu shot is done already.    She recently had knee surgery and is doing well in this regard.   No urinary sympotms- she had a GBS UTI over the summer.   Colonoscopy 2010  We changed her to crestor at her last visit- we will check her FLP today as well  Patient Active Problem List  Diagnosis  . Dyspnea  . Hypertension  . Hyperlipidemia  . Murmur  . Bruit  . Cardiac enlargement  . Nonspecific abnormal unspecified cardiovascular function study  . Cerebrovascular disease  . CAD (coronary artery disease)  . Aortic stenosis, mild  . Chronic renal failure  . Hypothyroid    Past Medical History  Diagnosis Date  . HTN (hypertension)   . Hyperlipidemia   . GERD (gastroesophageal reflux disease)   . Obese   . Diabetes mellitus   . Hypothyroid   . Asthma   . Renal insufficiency   . Cardiac enlargement   . Aortic stenosis, mild     per echo Jan 2013; EF is 61%  . Carotid stenosis, bilateral     per doppler Jan 2013  . Abnormal nuclear stress test Jan 2013  . CAD (coronary artery disease) Jan 2013    with cardiac cath. Managed medically. No obstructive disease in the large caliber vessels, disease in the small OM2 and distal LAD; not amenable to PCI or grafting.     Past Surgical History  Procedure Date  . Abdominal hysterectomy   . Tonsillectomy   . Knee  surgery   . Appendectomy   . Cardiac catheterization Jan 2013    No major obstructive disease in the large caliber vessels, with disease in a small OM2 and distal LAD; not amenable to PCI or grafting.   . Knee arthroscopy 06/24/2012    Procedure: ARTHROSCOPY KNEE;  Surgeon: Nestor Lewandowsky, MD;  Location:  SURGERY CENTER;  Service: Orthopedics;  Laterality: Right;  DEBRIDEMENT OF CHONDROMALACIA    History  Substance Use Topics  . Smoking status: Never Smoker   . Smokeless tobacco: Never Used  . Alcohol Use: No    Family History  Problem Relation Age of Onset  . Coronary artery disease Brother     CABG 14  . Heart disease Brother   . Coronary artery disease Brother     CABG 58    Allergies  Allergen Reactions  . Amoxicillin-Pot Clavulanate Nausea Only  . Codeine     REACTION: nausea  . Penicillins     REACTION: major hives,swelling    Medication list has been reviewed and updated.  Current Outpatient Prescriptions on File Prior to Visit  Medication Sig Dispense Refill  . albuterol (PROVENTIL HFA;VENTOLIN HFA) 108 (90 BASE) MCG/ACT inhaler Inhale 2 puffs into the  lungs every 6 (six) hours as needed.  18 g  2  . ARIPiprazole (ABILIFY) 20 MG tablet Take 20 mg by mouth daily.        Marland Kitchen aspirin EC 81 MG tablet Take 1 tablet (81 mg total) by mouth daily.  150 tablet  2  . diltiazem (TIAZAC) 360 MG 24 hr capsule Take 360 mg by mouth daily.        . DULoxetine (CYMBALTA) 60 MG capsule Take 60 mg by mouth 2 (two) times daily.       Marland Kitchen glimepiride (AMARYL) 4 MG tablet Take 1 tablet (4 mg total) by mouth 2 (two) times daily. Need office visit for additional refills.  60 tablet  0  . hydrALAZINE (APRESOLINE) 25 MG tablet Take 50 mg by mouth 2 (two) times daily.       . insulin glargine (LANTUS SOLOSTAR) 100 UNIT/ML injection Start at 10 units every evening.  Increase according to guidelines  5 pen  PRN  . liothyronine (CYTOMEL) 25 MCG tablet Take 25 mcg by mouth 2 (two) times  daily.        Marland Kitchen LORazepam (ATIVAN) 1 MG tablet Take 1 mg by mouth as needed.        . metFORMIN (GLUCOPHAGE) 1000 MG tablet Take 1 tablet (1,000 mg total) by mouth 2 (two) times daily with a meal. Need office visit for additional refills.  60 tablet  0  . nitroGLYCERIN (NITROSTAT) 0.4 MG SL tablet Place 1 tablet (0.4 mg total) under the tongue every 5 (five) minutes as needed for chest pain.  25 tablet  6  . omeprazole (PRILOSEC) 20 MG capsule Take 20 mg by mouth 2 (two) times daily.        . rosuvastatin (CRESTOR) 20 MG tablet Take 1 tablet (20 mg total) by mouth daily.  90 tablet  3    Review of Systems:  As per HPI- otherwise negative.   Physical Examination: Filed Vitals:   11/16/12 1245  BP: 131/74  Pulse: 83  Temp: 97.4 F (36.3 C)  Resp: 16   Filed Vitals:   11/16/12 1245  Height: 5\' 4"  (1.626 m)  Weight: 177 lb (80.287 kg)   Body mass index is 30.38 kg/(m^2). Ideal Body Weight: Weight in (lb) to have BMI = 25: 145.3   GEN: WDWN, NAD, Non-toxic, A & O x 3, overweight HEENT: Atraumatic, Normocephalic. Neck supple. No masses, No LAD. Ears and Nose: No external deformity. CV: RRR, No M/G/R. No JVD. No thrill. No extra heart sounds. PULM: CTA B, no wheezes, crackles, rhonchi. No retractions. No resp. distress. No accessory muscle use. ABD: S, NT, ND EXTR: No c/c/e NEURO Normal gait.  PSYCH: Normally interactive. Conversant. Not depressed or anxious appearing.  Calm demeanor.   Results for orders placed in visit on 11/16/12  POCT GLYCOSYLATED HEMOGLOBIN (HGB A1C)      Component Value Range   Hemoglobin A1C 7.5    POCT CBC      Component Value Range   WBC 8.5  4.6 - 10.2 K/uL   Lymph, poc 1.9  0.6 - 3.4   POC LYMPH PERCENT 22.1  10 - 50 %L   MID (cbc) 0.5  0 - 0.9   POC MID % 6.2  0 - 12 %M   POC Granulocyte 6.1  2 - 6.9   Granulocyte percent 71.7  37 - 80 %G   RBC 4.11  4.04 - 5.48 M/uL   Hemoglobin 12.2  12.2 -  16.2 g/dL   HCT, POC 16.1  09.6 - 47.9 %   MCV  93.9  80 - 97 fL   MCH, POC 29.7  27 - 31.2 pg   MCHC 31.6 (*) 31.8 - 35.4 g/dL   RDW, POC 04.5     Platelet Count, POC 293  142 - 424 K/uL   MPV 8.1  0 - 99.8 fL  POCT URINALYSIS DIPSTICK      Component Value Range   Color, UA yellow     Clarity, UA clear     Glucose, UA neg     Bilirubin, UA neg     Ketones, UA neg     Spec Grav, UA <=1.005     Blood, UA neg     pH, UA 6.5     Protein, UA neg     Urobilinogen, UA 0.2     Nitrite, UA neg     Leukocytes, UA Trace    POCT UA - MICROSCOPIC ONLY      Component Value Range   WBC, Ur, HPF, POC 0-3     RBC, urine, microscopic neg     Bacteria, U Microscopic neg     Mucus, UA neg     Epithelial cells, urine per micros 0-1     Crystals, Ur, HPF, POC neg     Casts, Ur, LPF, POC neg     Yeast, UA neg       Assessment and Plan: 1. Diabetes mellitus, type 2  POCT glycosylated hemoglobin (Hb A1C), POCT CBC, Comprehensive metabolic panel, POCT urinalysis dipstick, POCT UA - Microscopic Only, glimepiride (AMARYL) 4 MG tablet, insulin glargine (LANTUS SOLOSTAR) 100 UNIT/ML injection, metFORMIN (GLUCOPHAGE) 1000 MG tablet  2. Hyperlipidemia  Lipid panel, rosuvastatin (CRESTOR) 20 MG tablet  3. DM type 2 (diabetes mellitus, type 2)  insulin glargine (LANTUS SOLOSTAR) 100 UNIT/ML injection  4. GERD (gastroesophageal reflux disease)  omeprazole (PRILOSEC) 20 MG capsule  5. Asthma  albuterol (PROVENTIL HFA;VENTOLIN HFA) 108 (90 BASE) MCG/ACT inhaler   DM- control much improved.  Continue current therapy.  Await FLP- changed to crestor recently and await results to look for improvement.  Refilled her prilosec- she takes it BID and notes return of symptoms when she tried to change to QD. Also refilled her inhaler  Abbe Amsterdam, MD

## 2012-11-19 ENCOUNTER — Other Ambulatory Visit: Payer: Self-pay | Admitting: *Deleted

## 2012-11-19 MED ORDER — INSULIN PEN NEEDLE 31G X 8 MM MISC: Status: DC

## 2012-11-22 ENCOUNTER — Encounter: Payer: Self-pay | Admitting: Family Medicine

## 2013-02-18 ENCOUNTER — Ambulatory Visit (INDEPENDENT_AMBULATORY_CARE_PROVIDER_SITE_OTHER): Payer: Medicare Other | Admitting: Family Medicine

## 2013-02-18 VITALS — BP 198/73 | HR 111 | Temp 98.2°F | Resp 16 | Ht 65.0 in | Wt 183.0 lb

## 2013-02-18 DIAGNOSIS — N189 Chronic kidney disease, unspecified: Secondary | ICD-10-CM

## 2013-02-18 DIAGNOSIS — E785 Hyperlipidemia, unspecified: Secondary | ICD-10-CM

## 2013-02-18 DIAGNOSIS — I10 Essential (primary) hypertension: Secondary | ICD-10-CM

## 2013-02-18 DIAGNOSIS — E039 Hypothyroidism, unspecified: Secondary | ICD-10-CM

## 2013-02-18 DIAGNOSIS — I2510 Atherosclerotic heart disease of native coronary artery without angina pectoris: Secondary | ICD-10-CM

## 2013-02-18 DIAGNOSIS — Z79899 Other long term (current) drug therapy: Secondary | ICD-10-CM

## 2013-02-18 DIAGNOSIS — IMO0001 Reserved for inherently not codable concepts without codable children: Secondary | ICD-10-CM

## 2013-02-18 DIAGNOSIS — J309 Allergic rhinitis, unspecified: Secondary | ICD-10-CM

## 2013-02-18 LAB — COMPREHENSIVE METABOLIC PANEL
ALT: 18 U/L (ref 0–35)
BUN: 16 mg/dL (ref 6–23)
CO2: 27 mEq/L (ref 19–32)
Calcium: 10.5 mg/dL (ref 8.4–10.5)
Chloride: 97 mEq/L (ref 96–112)
Creat: 1.25 mg/dL — ABNORMAL HIGH (ref 0.50–1.10)
Glucose, Bld: 309 mg/dL — ABNORMAL HIGH (ref 70–99)

## 2013-02-18 LAB — LIPID PANEL
Cholesterol: 323 mg/dL — ABNORMAL HIGH (ref 0–200)
HDL: 55 mg/dL (ref >39)
Total CHOL/HDL Ratio: 5.9 Ratio

## 2013-02-18 MED ORDER — ROSUVASTATIN CALCIUM 40 MG PO TABS: 40.0000 mg | ORAL_TABLET | Freq: Every day | ORAL | Status: DC

## 2013-02-18 MED ORDER — AZELASTINE HCL 0.1 % NA SOLN: 1.0000 | Freq: Two times a day (BID) | NASAL | Status: DC

## 2013-02-18 MED ORDER — LIRAGLUTIDE 18 MG/3ML ~~LOC~~ SOLN: 1.2000 mg | Freq: Every day | SUBCUTANEOUS | Status: DC

## 2013-02-18 MED ORDER — FLUTICASONE PROPIONATE 50 MCG/ACT NA SUSP: 2.0000 | Freq: Every day | NASAL | Status: DC

## 2013-02-18 MED ORDER — LEVOCETIRIZINE DIHYDROCHLORIDE 5 MG PO TABS: 5.0000 mg | ORAL_TABLET | Freq: Every evening | ORAL | Status: AC

## 2013-02-18 NOTE — Progress Notes (Addendum)
Subjective:    Patient ID: Teresa Hart, female    DOB: Nov 08, 1951, 62 y.o.   MRN: [id number] Chief Complaint  Patient presents with  . Medication Refill  . Sinusitis    x 2 week    HPI  Did not take medications today as she is fasting.    Can check her BP at home but has not been lately - just lazy.  Just went to nephrology several weeks ago and she was a little with BP at 134/70 and they increased her hydralazine to 50 bid and kept her on her ditliazem. Pt reports her next appt w/ nephrology in in 2 mos.   Her cbgs are doing well - usually about 130-140 in a.m. Fasting. Checks them twice a day - either in the afternoon or evening - more about 140s-150s later in the day.  No lows and nothing >200.  Taking lantus 22u at night along with metformin bid and glimepride bid.  No sig lows or highs.  She has been taking crestor 40 for 3 months now.  Having a lot of congestion, sinus pressure, itchy eyes, popping ears.  Gets a lot of sinus congestion on a regular basis and has tried zyrtec, claritin, flonase, nasal saline without sig relief though in general the pills work better for her than the others.  Review of Systems  Constitutional: Positive for fatigue. Negative for fever, chills, diaphoresis, activity change and appetite change.  HENT: Positive for ear pain, congestion, sore throat, rhinorrhea, sneezing, postnasal drip and sinus pressure. Negative for nosebleeds, trouble swallowing, neck pain, neck stiffness, voice change and ear discharge.   Eyes: Positive for discharge and itching. Negative for visual disturbance.  Respiratory: Positive for cough. Negative for shortness of breath.   Cardiovascular: Negative for chest pain, palpitations and leg swelling.  Gastrointestinal: Negative for nausea, vomiting and abdominal pain.  Genitourinary: Negative for decreased urine volume.  Skin: Negative for rash.  Neurological: Positive for headaches. Negative for dizziness and syncope.   Hematological: Does not bruise/bleed easily.       166/103 L arm manual  BP 198/73  Pulse 111  Temp(Src) 98.2 F (36.8 C) (Oral)  Resp 16  Ht 5\' 5"  (1.651 m)  Wt 183 lb (83.008 kg)  BMI 30.45 kg/m2  SpO2 96% Objective:   Physical Exam  Constitutional: She is oriented to person, place, and time. She appears well-developed and well-nourished. She does not appear ill. No distress.  HENT:  Head: Normocephalic and atraumatic.  Right Ear: External ear and ear canal normal. A middle ear effusion is present.  Left Ear: External ear and ear canal normal. Tympanic membrane is retracted.  Nose: Mucosal edema and rhinorrhea present. Right sinus exhibits maxillary sinus tenderness. Left sinus exhibits maxillary sinus tenderness.  Mouth/Throat: Uvula is midline and mucous membranes are normal. No oropharyngeal exudate, posterior oropharyngeal edema, posterior oropharyngeal erythema or tonsillar abscesses.  Eyes: Conjunctivae are normal. Right eye exhibits no discharge. Left eye exhibits no discharge. No scleral icterus.  Neck: Normal range of motion. Neck supple. No thyromegaly present.  Cardiovascular: Normal rate, regular rhythm, normal heart sounds and intact distal pulses.   Pulmonary/Chest: Effort normal and breath sounds normal. No respiratory distress.  Musculoskeletal: She exhibits no edema.  Lymphadenopathy:       Head (right side): No submandibular, no preauricular and no posterior auricular adenopathy present.       Head (left side): No submandibular, no preauricular and no posterior auricular adenopathy present.  She has no cervical adenopathy.       Right: No supraclavicular adenopathy present.       Left: No supraclavicular adenopathy present.  Neurological: She is alert and oriented to person, place, and time.  Skin: Skin is warm and dry. She is not diaphoretic. No erythema.  Psychiatric: She has a normal mood and affect. Her behavior is normal.      Results for orders  placed in visit on 02/18/13  POCT GLYCOSYLATED HEMOGLOBIN (HGB A1C)      Result Value Range   Hemoglobin A1C 9.7      Assessment & Plan:  Unspecified essential hypertension - Plan: Comprehensive metabolic panel, Lipid panel. Take meds DAILY.  Recheck bp outside office this wk and call w/ results. F/u in 2 wks for bp check.  May need to increase hydroxyzine to tid or add in clonidine if cont to be elevated.  Encounter for long-term (current) use of other medications - Plan: Comprehensive metabolic panel, Lipid panel  Hypothyroidism - Plan: Comprehensive metabolic panel, Lipid panel  Hyperlipidemia - Plan: Comprehensive metabolic panel, Lipid panel, rosuvastatin (CRESTOR) 40 MG tablet - If still not at goal, recheck in another 3 mos while pt works on weight loss and tlc and start victoza but then may need to add second med in.  Type II or unspecified type diabetes mellitus without mention of complication, uncontrolled - Plan: Comprehensive metabolic panel, Lipid panel, POCT glycosylated hemoglobin (Hb A1C) - Doing MUCH worse than pt thought. hgba1c 1 point higher than prev.  Continue metformin and start victoza to help w/ DM control and weight loss. Increase lantus to 25u qhs. It is probably that she is not getting much benefit from the amaryl since she has been on lantus so consider stopping amaryl at f/u and monitor to ensure no worsening.  Chronic renal failure - stable and followed by nephrology. Nephrology noted scanned in and reviewed.  CAD (coronary artery disease) - followed by Dr. Jens Som at [facility] annually.  Allergic rhinitis - Cont astelin and flonase. Switch to xyzal since she has failed other antihistamines. If allergies continue, cons singular.  Meds ordered this encounter  Medications  . lithium carbonate 150 MG capsule    Sig: Take 150 mg by mouth 3 (three) times daily with meals.  . hydrALAZINE (APRESOLINE) 50 MG tablet    Sig: Take 50 mg by mouth 2 (two) times daily.  Marland Kitchen  levocetirizine (XYZAL) 5 MG tablet    Sig: Take 1 tablet (5 mg total) by mouth every evening.    Dispense:  30 tablet    Refill:  5  . azelastine (ASTELIN) 137 MCG/SPRAY nasal spray    Sig: Place 1 spray into the nose 2 (two) times daily.    Dispense:  30 mL    Refill:  5  . fluticasone (FLONASE) 50 MCG/ACT nasal spray    Sig: Place 2 sprays into the nose at bedtime.    Dispense:  16 g    Refill:  5  . rosuvastatin (CRESTOR) 40 MG tablet    Sig: Take 1 tablet (40 mg total) by mouth daily.    Dispense:  90 tablet    Refill:  3  . Liraglutide (VICTOZA) 18 MG/3ML SOLN injection    Sig: Inject 0.2 mLs (1.2 mg total) into the skin daily. First the first week, inject 0.70mL sq daily. Then up to 0.2    Dispense:  6 mg    Refill:  2

## 2013-02-19 ENCOUNTER — Other Ambulatory Visit: Payer: Self-pay

## 2013-02-19 MED ORDER — GLUCOSE BLOOD VI STRP: ORAL_STRIP | Status: DC

## 2013-02-19 MED ORDER — LANCET DEVICES MISC: Status: DC

## 2013-02-22 ENCOUNTER — Other Ambulatory Visit: Payer: Self-pay | Admitting: Family Medicine

## 2013-03-15 ENCOUNTER — Ambulatory Visit (INDEPENDENT_AMBULATORY_CARE_PROVIDER_SITE_OTHER): Payer: Medicare Other | Admitting: Family Medicine

## 2013-03-15 VITALS — BP 144/70 | HR 84 | Temp 98.4°F | Resp 16 | Ht 64.0 in | Wt 185.0 lb

## 2013-03-15 DIAGNOSIS — Z23 Encounter for immunization: Secondary | ICD-10-CM

## 2013-03-15 DIAGNOSIS — E118 Type 2 diabetes mellitus with unspecified complications: Secondary | ICD-10-CM

## 2013-03-15 DIAGNOSIS — R05 Cough: Secondary | ICD-10-CM

## 2013-03-15 DIAGNOSIS — E1165 Type 2 diabetes mellitus with hyperglycemia: Secondary | ICD-10-CM

## 2013-03-15 LAB — POCT GLYCOSYLATED HEMOGLOBIN (HGB A1C): Hemoglobin A1C: 8.5

## 2013-03-15 MED ORDER — BENZONATATE 100 MG PO CAPS: 100.0000 mg | ORAL_CAPSULE | Freq: Three times a day (TID) | ORAL | Status: DC | PRN

## 2013-03-15 NOTE — Progress Notes (Signed)
[facility] [address] [place] [address] [phone]  Date:  03/15/2013   Name:  Teresa Hart   DOB:  12-28-1950   MRN:  [id number]  PCP:  Abbe Amsterdam, MD    Chief Complaint: Follow-up   History of Present Illness:  Teresa Hart is a 62 y.o. very pleasant female patient who presents with the following:  Here today to recheck her DM and other medical problems.  Last seen here earlier this month- at that time her A1c was 9.7%. At that time was started on Victoza, and increased lantus to 22 units.  Continue metformin and amaryl.    She is seeing nephrology for her CRF- stable.  Will recheck with them in June. They have been concerned about her HTN and continue to try and get her BP down. At her most recent visit her hydralazine was increased to 50 BID.   She thinks that she is doing well. She is tolerating the victoza without difficultly.  However, she is concerned about her DM and CHL control.    Her glucose is staying 190- 200, both fasting and in the afternoons. She is working on diet and exercise, and is frustrated that she has not seen much improvment as of yet. Her weight is actually up 2 lbs since her visit here 02/18/2013.    Having some PND, but is using an antihistamine and nasal spray. The PND seems to cause some cough- she has noted a cough for a few weeks but has not felt ill.  She is interested in using some cough syrup like she had in the past- hycodan.    She is still taking 40 mg of crestor daily.   Patient Active Problem List  Diagnosis  . Dyspnea  . Hypertension  . Hyperlipidemia  . Murmur  . Bruit  . Cardiac enlargement  . Nonspecific abnormal unspecified cardiovascular function study  . Cerebrovascular disease  . CAD (coronary artery disease)  . Aortic stenosis, mild  . Chronic renal failure  . Hypothyroid    Past Medical History  Diagnosis Date  . HTN (hypertension)   . Hyperlipidemia   . GERD (gastroesophageal  reflux disease)   . Obese   . Diabetes mellitus   . Hypothyroid   . Asthma   . Renal insufficiency   . Cardiac enlargement   . Aortic stenosis, mild     per echo Jan 2013; EF is 61%  . Carotid stenosis, bilateral     per doppler Jan 2013  . Abnormal nuclear stress test Jan 2013  . CAD (coronary artery disease) Jan 2013    with cardiac cath. Managed medically. No obstructive disease in the large caliber vessels, disease in the small OM2 and distal LAD; not amenable to PCI or grafting.     Past Surgical History  Procedure Laterality Date  . Abdominal hysterectomy    . Tonsillectomy    . Knee surgery    . Appendectomy    . Cardiac catheterization  Jan 2013    No major obstructive disease in the large caliber vessels, with disease in a small OM2 and distal LAD; not amenable to PCI or grafting.   . Knee arthroscopy  06/24/2012    Procedure: ARTHROSCOPY KNEE;  Surgeon: Nestor Lewandowsky, MD;  Location: [facility];  Service: Orthopedics;  Laterality: Right;  DEBRIDEMENT OF CHONDROMALACIA    History  Substance Use Topics  . Smoking status: Never Smoker   .  Smokeless tobacco: Never Used  . Alcohol Use: No    Family History  Problem Relation Age of Onset  . Coronary artery disease Brother     CABG 52  . Heart disease Brother   . Coronary artery disease Brother     CABG 58    Allergies  Allergen Reactions  . Amoxicillin-Pot Clavulanate Nausea Only  . Codeine     REACTION: nausea  . Penicillins     REACTION: major hives,swelling    Medication list has been reviewed and updated.  Current Outpatient Prescriptions on File Prior to Visit  Medication Sig Dispense Refill  . albuterol (PROVENTIL HFA;VENTOLIN HFA) 108 (90 BASE) MCG/ACT inhaler Inhale 2 puffs into the lungs every 6 (six) hours as needed.  18 g  2  . ARIPiprazole (ABILIFY) 20 MG tablet Take 20 mg by mouth daily.        Marland Kitchen aspirin EC 81 MG tablet Take 1 tablet (81 mg total) by mouth daily.  150 tablet  2   . azelastine (ASTELIN) 137 MCG/SPRAY nasal spray Place 1 spray into the nose 2 (two) times daily.  30 mL  5  . DULoxetine (CYMBALTA) 60 MG capsule Take 60 mg by mouth 2 (two) times daily.       . fluticasone (FLONASE) 50 MCG/ACT nasal spray Place 2 sprays into the nose at bedtime.  16 g  5  . glimepiride (AMARYL) 4 MG tablet Take 1 tablet (4 mg total) by mouth 2 (two) times daily.  180 tablet  3  . glucose blood (FREESTYLE TEST STRIPS) test strip Use to test blood sugar 3 times daily  300 each  3  . hydrALAZINE (APRESOLINE) 50 MG tablet Take 50 mg by mouth 2 (two) times daily.      . insulin glargine (LANTUS SOLOSTAR) 100 UNIT/ML injection Use 18 units daily.  5 pen  PRN  . Insulin Pen Needle 31G X 8 MM MISC Use as directed. Dx code: 250.00  100 each  2  . Lancet Devices MISC Use to test blood sugar 3 times daily  300 each  3  . levocetirizine (XYZAL) 5 MG tablet Take 1 tablet (5 mg total) by mouth every evening.  30 tablet  5  . liothyronine (CYTOMEL) 25 MCG tablet Take 25 mcg by mouth 2 (two) times daily.        . Liraglutide (VICTOZA) 18 MG/3ML SOLN injection Inject 0.2 mLs (1.2 mg total) into the skin daily. First the first week, inject 0.38mL sq daily. Then up to 0.2  6 mg  2  . lithium carbonate 150 MG capsule Take 150 mg by mouth 3 (three) times daily with meals.      Marland Kitchen LORazepam (ATIVAN) 1 MG tablet Take 1 mg by mouth as needed.        . metFORMIN (GLUCOPHAGE) 1000 MG tablet Take 1 tablet (1,000 mg total) by mouth 2 (two) times daily with a meal.  180 tablet  3  . omeprazole (PRILOSEC) 20 MG capsule Take 1 capsule (20 mg total) by mouth 2 (two) times daily.  180 capsule  3  . rosuvastatin (CRESTOR) 40 MG tablet Take 1 tablet (40 mg total) by mouth daily.  90 tablet  3  . TAZTIA XT 360 MG 24 hr capsule TAKE (1) CAPSULE BY MOUTH ONCE DAILY.  90 capsule  1  . nitroGLYCERIN (NITROSTAT) 0.4 MG SL tablet Place 1 tablet (0.4 mg total) under the tongue every 5 (five) minutes as needed  for chest  pain.  25 tablet  6   No current facility-administered medications on file prior to visit.    Review of Systems:  As per HPI- otherwise negative.   Physical Examination: Filed Vitals:   03/15/13 0759  BP: 170/76  Pulse: 84  Temp: 98.4 F (36.9 C)  Resp: 16   Filed Vitals:   03/15/13 0759  Height: 5\' 4"  (1.626 m)  Weight: 185 lb (83.915 kg)   Body mass index is 31.74 kg/(m^2). Ideal Body Weight: Weight in (lb) to have BMI = 25: 145.3  GEN: WDWN, NAD, Non-toxic, A & O x 3, overweight HEENT: Atraumatic, Normocephalic. Neck supple. No masses, No LAD. Ears and Nose: No external deformity. CV: RRR, No M/G/R. No JVD. No thrill. No extra heart sounds. PULM: CTA B, no wheezes, crackles, rhonchi. No retractions. No resp. distress. No accessory muscle use. ABD: S, NT, ND, +BS. No rebound. No HSM. EXTR: No c/c/e NEURO Normal gait.  PSYCH: Normally interactive. Conversant. Not depressed or anxious appearing.  Calm demeanor.  Foot exam:  She has poor sensation to filament over most areas tested on her bilateral soles.    Results for orders placed in visit on 03/15/13  POCT GLYCOSYLATED HEMOGLOBIN (HGB A1C)      Result Value Range   Hemoglobin A1C 8.5       Assessment and Plan: Type II or unspecified type diabetes mellitus with unspecified complication, uncontrolled - Plan: POCT glycosylated hemoglobin (Hb A1C), Ambulatory referral to Endocrinology  Immunization due - Plan: Tdap vaccine greater than or equal to 7yo IM  Cough - Plan: benzonatate (TESSALON) 100 MG capsule  Paislie's DM control is not as good as it had been.  Her A1c does probably not show her full benefit from victoza since she just strarted it about 3 weeks ago. Even so, I am concerned that her DM is not responding completely to treatment, putting her a risk for complications.  Will refer to endocrinology for further evaluation.   Updated her Tdap today  Gave rx for tessalon perles- would prefer to avoid  narcotics if possible as she is already on some medications which can be sedating   Signed Abbe Amsterdam, MD

## 2013-03-19 ENCOUNTER — Encounter: Payer: Self-pay | Admitting: Endocrinology

## 2013-03-19 ENCOUNTER — Ambulatory Visit (INDEPENDENT_AMBULATORY_CARE_PROVIDER_SITE_OTHER): Payer: Medicare Other | Admitting: Endocrinology

## 2013-03-19 VITALS — BP 136/70 | HR 76 | Wt 186.0 lb

## 2013-03-19 DIAGNOSIS — E1049 Type 1 diabetes mellitus with other diabetic neurological complication: Secondary | ICD-10-CM

## 2013-03-19 NOTE — Patient Instructions (Addendum)
good diet and exercise habits significanly improve the control of your diabetes.  please let me know if you wish to be referred to a dietician.  high blood sugar is very risky to your health.  you should see an eye doctor every year.  You are at higher than average risk for pneumonia and hepatitis-B.  You should be vaccinated against both.   controlling your blood pressure and cholesterol drastically reduces the damage diabetes does to your body.  this also applies to quitting smoking.  please discuss these with your doctor.  you should take an aspirin every day, unless you have been advised by a doctor not to. check your blood sugar twice a day.  vary the time of day when you check, between before the 3 meals, and at bedtime.  also check if you have symptoms of your blood sugar being too high or too low.  please keep a record of the readings and bring it to your next appointment here.  please call us sooner if your blood sugar goes below 70, or if you have a lot of readings over 200.   we will need to take this complex situation in stages.    For now, please stop the victoza, glimepiride, and metformin, and:  Increase the lantus to 30 units daily.  Please come back for a follow-up appointment in 2 weeks.

## 2013-03-19 NOTE — Progress Notes (Signed)
Subjective:    Patient ID: Teresa Hart, female    DOB: February 06, 1951, 62 y.o.   MRN: [id number]  HPI pt states 13 years h/o dm, complicated by CAD, PAD peripheral sensory neuropathy, and renal failure.  he has been on insulin x 6 years.  pt says her diet is good, but exercise is not good.  Pt reports few years of moderate pain of the feet, and assoc numbness.  She reports cbg's are extremely variable (48-400).  It is in general higher as the day goes on.   Past Medical History  Diagnosis Date  . HTN (hypertension)   . Hyperlipidemia   . GERD (gastroesophageal reflux disease)   . Obese   . Diabetes mellitus   . Hypothyroid   . Asthma   . Renal insufficiency   . Cardiac enlargement   . Aortic stenosis, mild     per echo Jan 2013; EF is 61%  . Carotid stenosis, bilateral     per doppler Jan 2013  . Abnormal nuclear stress test Jan 2013  . CAD (coronary artery disease) Jan 2013    with cardiac cath. Managed medically. No obstructive disease in the large caliber vessels, disease in the small OM2 and distal LAD; not amenable to PCI or grafting.     Past Surgical History  Procedure Laterality Date  . Abdominal hysterectomy    . Tonsillectomy    . Knee surgery    . Appendectomy    . Cardiac catheterization  Jan 2013    No major obstructive disease in the large caliber vessels, with disease in a small OM2 and distal LAD; not amenable to PCI or grafting.   . Knee arthroscopy  06/24/2012    Procedure: ARTHROSCOPY KNEE;  Surgeon: Nestor Lewandowsky, MD;  Location: [facility];  Service: Orthopedics;  Laterality: Right;  DEBRIDEMENT OF CHONDROMALACIA    History   Social History  . Marital Status: Married    Spouse Name: N/A    Number of Children: 0  . Years of Education: N/A   Occupational History  . Not on file.   Social History Main Topics  . Smoking status: Never Smoker   . Smokeless tobacco: Never Used  . Alcohol Use: No  . Drug Use: No  . Sexually Active: Yes    Other Topics Concern  . Not on file   Social History Narrative  . No narrative on file    Current Outpatient Prescriptions on File Prior to Visit  Medication Sig Dispense Refill  . albuterol (PROVENTIL HFA;VENTOLIN HFA) 108 (90 BASE) MCG/ACT inhaler Inhale 2 puffs into the lungs every 6 (six) hours as needed.  18 g  2  . ARIPiprazole (ABILIFY) 20 MG tablet Take 20 mg by mouth daily.        Marland Kitchen aspirin EC 81 MG tablet Take 1 tablet (81 mg total) by mouth daily.  150 tablet  2  . azelastine (ASTELIN) 137 MCG/SPRAY nasal spray Place 1 spray into the nose 2 (two) times daily.  30 mL  5  . benzonatate (TESSALON) 100 MG capsule Take 1 capsule (100 mg total) by mouth 3 (three) times daily as needed for cough.  40 capsule  0  . DULoxetine (CYMBALTA) 60 MG capsule Take 60 mg by mouth 2 (two) times daily.       . fluticasone (FLONASE) 50 MCG/ACT nasal spray Place 2 sprays into the nose at bedtime.  16 g  5  . glimepiride (AMARYL) 4  MG tablet Take 1 tablet (4 mg total) by mouth 2 (two) times daily.  180 tablet  3  . glucose blood (FREESTYLE TEST STRIPS) test strip Use to test blood sugar 3 times daily  300 each  3  . hydrALAZINE (APRESOLINE) 50 MG tablet Take 50 mg by mouth 2 (two) times daily.      . insulin glargine (LANTUS SOLOSTAR) 100 UNIT/ML injection Use 18 units daily.  5 pen  PRN  . Insulin Pen Needle 31G X 8 MM MISC Use as directed. Dx code: 250.00  100 each  2  . Lancet Devices MISC Use to test blood sugar 3 times daily  300 each  3  . levocetirizine (XYZAL) 5 MG tablet Take 1 tablet (5 mg total) by mouth every evening.  30 tablet  5  . liothyronine (CYTOMEL) 25 MCG tablet Take 25 mcg by mouth 2 (two) times daily.        . Liraglutide (VICTOZA) 18 MG/3ML SOLN injection Inject 0.2 mLs (1.2 mg total) into the skin daily. First the first week, inject 0.4mL sq daily. Then up to 0.2  6 mg  2  . lithium carbonate 150 MG capsule Take 150 mg by mouth 3 (three) times daily with meals.      Marland Kitchen  LORazepam (ATIVAN) 1 MG tablet Take 1 mg by mouth as needed.        . metFORMIN (GLUCOPHAGE) 1000 MG tablet Take 1 tablet (1,000 mg total) by mouth 2 (two) times daily with a meal.  180 tablet  3  . omeprazole (PRILOSEC) 20 MG capsule Take 1 capsule (20 mg total) by mouth 2 (two) times daily.  180 capsule  3  . rosuvastatin (CRESTOR) 40 MG tablet Take 1 tablet (40 mg total) by mouth daily.  90 tablet  3  . TAZTIA XT 360 MG 24 hr capsule TAKE (1) CAPSULE BY MOUTH ONCE DAILY.  90 capsule  1  . nitroGLYCERIN (NITROSTAT) 0.4 MG SL tablet Place 1 tablet (0.4 mg total) under the tongue every 5 (five) minutes as needed for chest pain.  25 tablet  6   No current facility-administered medications on file prior to visit.   Allergies  Allergen Reactions  . Amoxicillin-Pot Clavulanate Nausea Only  . Codeine     REACTION: nausea  . Penicillins     REACTION: major hives,swelling   Family History  Problem Relation Age of Onset  . Coronary artery disease Brother     CABG 40  . Heart disease Brother   . Coronary artery disease Brother     CABG 64  DM: none  BP 136/70  Pulse 76  Wt 186 lb (84.369 kg)  BMI 31.91 kg/m2  SpO2 96%  Review of Systems denies weight loss, blurry vision, chest pain, sob, n/v, cramps, excessive diaphoresis, memory loss, and menopausal sxs, . She reports headache, easy bruising, rhinorrhea, depression, and urinary frequency.    Objective:   Physical Exam VS: see vs page GEN: no distress HEAD: head: no deformity eyes: no periorbital swelling, no proptosis external nose and ears are normal.   mouth: no lesion seen.   NECK: supple, thyroid is not enlarged. CHEST WALL: no deformity. LUNGS:  Clear to auscultation. CV: reg rate and rhythm; soft systolic murmur.   ABD: abdomen is soft, nontender.  no hepatosplenomegaly.  not distended.  no hernia MUSCULOSKELETAL: muscle bulk and strength are grossly normal.  no obvious joint swelling.  gait is normal and steady.   EXTEMITIES:  no deformity.  no ulcer on the feet.  feet are of normal color and temp.  no edema PULSES: dorsalis pedis intact bilat.  no carotid bruit.   NEURO:  cn 2-12 grossly intact.   readily moves all 4's.  sensation is intact to touch on the feet, but decreased from normal.   SKIN:  Normal texture and temperature.  No rash or suspicious lesion is visible.   NODES:  None palpable at the neck.   PSYCH: alert, oriented x3.  Does not appear anxious nor depressed.   Lab Results  Component Value Date   HGBA1C 8.5 03/15/2013      Assessment & Plan:  DM: needs increased rx Foot sxs, prob due to DM Depression.  This complicates the rx of DM.

## 2013-03-21 DIAGNOSIS — E1049 Type 1 diabetes mellitus with other diabetic neurological complication: Secondary | ICD-10-CM | POA: Insufficient documentation

## 2013-03-26 ENCOUNTER — Telehealth: Payer: Self-pay | Admitting: Endocrinology

## 2013-03-26 NOTE — Telephone Encounter (Signed)
Please increase lantus to 40 units qd.

## 2013-03-26 NOTE — Telephone Encounter (Signed)
LVOM, pt to call back if any questions

## 2013-03-26 NOTE — Telephone Encounter (Signed)
Pt's blood sugar is running high, she left message earlier today. Please call before leaving today, CB# [phone] / Sherri S.

## 2013-03-29 ENCOUNTER — Telehealth: Payer: Self-pay

## 2013-03-29 NOTE — Telephone Encounter (Signed)
Please increase insulin to 60 units qd.

## 2013-03-29 NOTE — Telephone Encounter (Signed)
Called her, and Dr George Hugh nurse did call her and told her she was going to talk to the doctor then call her back. Her sugars are 525 now , called Dr Everardo All office was told to send message in Epic, this is the best way to get a response. Please let us know if we need to do anything or call patient, her sugars are above 500 please advise.

## 2013-03-29 NOTE — Telephone Encounter (Signed)
Pt states dr copland referred her to specialist for diabetes. States that dr took her off of her medications and increased her insulin. States her sugars have been over 500 for the past few days and shes left messages at specialist office Friday and today with no return calls yet.   Pt would like dr copland to know that and ask what to do next.  Best:[phone]  bf

## 2013-03-29 NOTE — Telephone Encounter (Signed)
Pt advised.

## 2013-03-29 NOTE — Telephone Encounter (Signed)
Pt advised see previous message 

## 2013-03-29 NOTE — Telephone Encounter (Signed)
We are going to call Dr. George Hugh office and make sure she has been contacted with instructions

## 2013-03-29 NOTE — Telephone Encounter (Signed)
Pt called stating her cbg has been hgh in the am 400's-500's and through out the day it has been high also x's 4 days, please [phone]

## 2013-04-02 ENCOUNTER — Ambulatory Visit: Payer: Medicare Other | Admitting: Endocrinology

## 2013-04-07 ENCOUNTER — Encounter: Payer: Self-pay | Admitting: Endocrinology

## 2013-04-07 ENCOUNTER — Ambulatory Visit (INDEPENDENT_AMBULATORY_CARE_PROVIDER_SITE_OTHER): Payer: Medicare Other | Admitting: Endocrinology

## 2013-04-07 VITALS — BP 126/74 | HR 90 | Ht 65.0 in | Wt 186.0 lb

## 2013-04-07 DIAGNOSIS — E1049 Type 1 diabetes mellitus with other diabetic neurological complication: Secondary | ICD-10-CM

## 2013-04-07 MED ORDER — INSULIN GLARGINE 100 UNIT/ML SOLOSTAR PEN: 90.0000 [IU] | PEN_INJECTOR | Freq: Every day | SUBCUTANEOUS | Status: DC

## 2013-04-07 NOTE — Patient Instructions (Addendum)
check your blood sugar twice a day.  vary the time of day when you check, between before the 3 meals, and at bedtime.  also check if you have symptoms of your blood sugar being too high or too low.  please keep a record of the readings and bring it to your next appointment here.  please call us sooner if your blood sugar goes below 70, or if you have a lot of readings over 200.   Increase the lantus to 90 units daily.  Please come back for a follow-up appointment in 2 weeks.

## 2013-04-07 NOTE — Progress Notes (Signed)
Subjective:    Patient ID: Teresa Hart, female    DOB: 1950-12-28, 62 y.o.   MRN: [id number]  HPI pt returns for f/u of insulin-requiring DM (dx'ed 2001; not in the context of pancreatitis; he has moderate neuropathy of the lower extremities, and associated CAD, PAD and renal failure).  Despite increasing the lantus to 60 units qd, cbg's have continued to be 400-500.  There is no trend throughout the day.  She denies n/v.   Past Medical History  Diagnosis Date  . HTN (hypertension)   . Hyperlipidemia   . GERD (gastroesophageal reflux disease)   . Obese   . Diabetes mellitus   . Hypothyroid   . Asthma   . Renal insufficiency   . Cardiac enlargement   . Aortic stenosis, mild     per echo Jan 2013; EF is 61%  . Carotid stenosis, bilateral     per doppler Jan 2013  . Abnormal nuclear stress test Jan 2013  . CAD (coronary artery disease) Jan 2013    with cardiac cath. Managed medically. No obstructive disease in the large caliber vessels, disease in the small OM2 and distal LAD; not amenable to PCI or grafting.     Past Surgical History  Procedure Laterality Date  . Abdominal hysterectomy    . Tonsillectomy    . Knee surgery    . Appendectomy    . Cardiac catheterization  Jan 2013    No major obstructive disease in the large caliber vessels, with disease in a small OM2 and distal LAD; not amenable to PCI or grafting.   . Knee arthroscopy  06/24/2012    Procedure: ARTHROSCOPY KNEE;  Surgeon: Nestor Lewandowsky, MD;  Location: [facility];  Service: Orthopedics;  Laterality: Right;  DEBRIDEMENT OF CHONDROMALACIA    History   Social History  . Marital Status: Married    Spouse Name: N/A    Number of Children: 0  . Years of Education: N/A   Occupational History  . Not on file.   Social History Main Topics  . Smoking status: Never Smoker   . Smokeless tobacco: Never Used  . Alcohol Use: No  . Drug Use: No  . Sexually Active: Yes   Other Topics Concern  . Not  on file   Social History Narrative  . No narrative on file    Current Outpatient Prescriptions on File Prior to Visit  Medication Sig Dispense Refill  . albuterol (PROVENTIL HFA;VENTOLIN HFA) 108 (90 BASE) MCG/ACT inhaler Inhale 2 puffs into the lungs every 6 (six) hours as needed.  18 g  2  . ARIPiprazole (ABILIFY) 20 MG tablet Take 20 mg by mouth daily.        Marland Kitchen aspirin EC 81 MG tablet Take 1 tablet (81 mg total) by mouth daily.  150 tablet  2  . azelastine (ASTELIN) 137 MCG/SPRAY nasal spray Place 1 spray into the nose 2 (two) times daily.  30 mL  5  . benzonatate (TESSALON) 100 MG capsule Take 1 capsule (100 mg total) by mouth 3 (three) times daily as needed for cough.  40 capsule  0  . DULoxetine (CYMBALTA) 60 MG capsule Take 60 mg by mouth 2 (two) times daily.       . fluticasone (FLONASE) 50 MCG/ACT nasal spray Place 2 sprays into the nose at bedtime.  16 g  5  . glucose blood (FREESTYLE TEST STRIPS) test strip Use to test blood sugar 3 times daily  300 each  3  . hydrALAZINE (APRESOLINE) 50 MG tablet Take 50 mg by mouth 2 (two) times daily.      . Insulin Pen Needle 31G X 8 MM MISC Use as directed. Dx code: 250.00  100 each  2  . Lancet Devices MISC Use to test blood sugar 3 times daily  300 each  3  . levocetirizine (XYZAL) 5 MG tablet Take 1 tablet (5 mg total) by mouth every evening.  30 tablet  5  . liothyronine (CYTOMEL) 25 MCG tablet Take 25 mcg by mouth 2 (two) times daily.        Marland Kitchen lithium carbonate 150 MG capsule Take 150 mg by mouth 3 (three) times daily with meals.      Marland Kitchen LORazepam (ATIVAN) 1 MG tablet Take 1 mg by mouth as needed.        Marland Kitchen omeprazole (PRILOSEC) 20 MG capsule Take 1 capsule (20 mg total) by mouth 2 (two) times daily.  180 capsule  3  . rosuvastatin (CRESTOR) 40 MG tablet Take 1 tablet (40 mg total) by mouth daily.  90 tablet  3  . TAZTIA XT 360 MG 24 hr capsule TAKE (1) CAPSULE BY MOUTH ONCE DAILY.  90 capsule  1  . nitroGLYCERIN (NITROSTAT) 0.4 MG SL  tablet Place 1 tablet (0.4 mg total) under the tongue every 5 (five) minutes as needed for chest pain.  25 tablet  6   No current facility-administered medications on file prior to visit.    Allergies  Allergen Reactions  . Amoxicillin-Pot Clavulanate Nausea Only  . Codeine     REACTION: nausea  . Penicillins     REACTION: major hives,swelling    Family History  Problem Relation Age of Onset  . Coronary artery disease Brother     CABG 38  . Heart disease Brother   . Coronary artery disease Brother     CABG 58    BP 126/74  Pulse 90  Ht 5\' 5"  (1.651 m)  Wt 186 lb (84.369 kg)  BMI 30.95 kg/m2  SpO2 97%  Review of Systems denies hypoglycemia and weight change    Objective:   Physical Exam VITAL SIGNS:  See vs page GENERAL: no distress SKIN:  Insulin injection sites at the anterior abdomen are normal, except for a few ecchymoses.  she brings a record of her cbg's which i have reviewed today.    Assessment & Plan:  DM: this insulin regimen is chosen for the time being, for the purpose of dose-finding.

## 2013-04-09 ENCOUNTER — Telehealth: Payer: Self-pay

## 2013-04-09 NOTE — Telephone Encounter (Signed)
Please increase lantus to 120 units qd

## 2013-04-09 NOTE — Telephone Encounter (Signed)
Pt called to to let you know her cbg this am is 428,please advise [phone]

## 2013-04-09 NOTE — Telephone Encounter (Signed)
Pt advised.

## 2013-04-13 ENCOUNTER — Telehealth: Payer: Self-pay

## 2013-04-13 NOTE — Telephone Encounter (Signed)
Pt left voice mail cbg is 265 this am, please advise [phone]

## 2013-04-13 NOTE — Telephone Encounter (Signed)
Pt advised.

## 2013-04-13 NOTE — Telephone Encounter (Signed)
Please increase lantus to 150 units qam

## 2013-04-16 ENCOUNTER — Telehealth: Payer: Self-pay | Admitting: Endocrinology

## 2013-04-16 NOTE — Telephone Encounter (Signed)
Please increase insulin to 170 units daily

## 2013-04-16 NOTE — Telephone Encounter (Signed)
BS is 221 this AM after insulin. Pt instructed to call in if over 100. Please call back at [phone] / Roanna Raider

## 2013-04-16 NOTE — Telephone Encounter (Signed)
Pt's husband stated pt was not available, husband to relay the message

## 2013-04-21 ENCOUNTER — Encounter: Payer: Self-pay | Admitting: Endocrinology

## 2013-04-21 ENCOUNTER — Ambulatory Visit (INDEPENDENT_AMBULATORY_CARE_PROVIDER_SITE_OTHER): Payer: Medicare Other | Admitting: Endocrinology

## 2013-04-21 VITALS — BP 126/80 | HR 88 | Ht 65.0 in | Wt 193.0 lb

## 2013-04-21 DIAGNOSIS — E1049 Type 1 diabetes mellitus with other diabetic neurological complication: Secondary | ICD-10-CM

## 2013-04-21 MED ORDER — INSULIN GLARGINE 100 UNIT/ML SOLOSTAR PEN: 200.0000 [IU] | PEN_INJECTOR | Freq: Every day | SUBCUTANEOUS | Status: DC

## 2013-04-21 NOTE — Progress Notes (Signed)
Subjective:    Patient ID: Teresa Hart, female    DOB: 06/07/1951, 62 y.o.   MRN: [id number]  HPI pt returns for f/u of insulin-requiring DM (dx'ed 2001; not in the context of pancreatitis; she has moderate neuropathy of the lower extremities, and associated CAD, PAD and renal failure). Even with increasing the insulin to 170 units qd, cbg's are still in the 200's.  she brings a record of her cbg's which i have reviewed today.  There is no trend throughout the day. She still has fatigue.   Past Medical History  Diagnosis Date  . HTN (hypertension)   . Hyperlipidemia   . GERD (gastroesophageal reflux disease)   . Obese   . Diabetes mellitus   . Hypothyroid   . Asthma   . Renal insufficiency   . Cardiac enlargement   . Aortic stenosis, mild     per echo Jan 2013; EF is 61%  . Carotid stenosis, bilateral     per doppler Jan 2013  . Abnormal nuclear stress test Jan 2013  . CAD (coronary artery disease) Jan 2013    with cardiac cath. Managed medically. No obstructive disease in the large caliber vessels, disease in the small OM2 and distal LAD; not amenable to PCI or grafting.     Past Surgical History  Procedure Laterality Date  . Abdominal hysterectomy    . Tonsillectomy    . Knee surgery    . Appendectomy    . Cardiac catheterization  Jan 2013    No major obstructive disease in the large caliber vessels, with disease in a small OM2 and distal LAD; not amenable to PCI or grafting.   . Knee arthroscopy  06/24/2012    Procedure: ARTHROSCOPY KNEE;  Surgeon: Nestor Lewandowsky, MD;  Location: [facility];  Service: Orthopedics;  Laterality: Right;  DEBRIDEMENT OF CHONDROMALACIA    History   Social History  . Marital Status: Married    Spouse Name: N/A    Number of Children: 0  . Years of Education: N/A   Occupational History  . Not on file.   Social History Main Topics  . Smoking status: Never Smoker   . Smokeless tobacco: Never Used  . Alcohol Use: No  .  Drug Use: No  . Sexually Active: Yes   Other Topics Concern  . Not on file   Social History Narrative  . No narrative on file    Current Outpatient Prescriptions on File Prior to Visit  Medication Sig Dispense Refill  . albuterol (PROVENTIL HFA;VENTOLIN HFA) 108 (90 BASE) MCG/ACT inhaler Inhale 2 puffs into the lungs every 6 (six) hours as needed.  18 g  2  . ARIPiprazole (ABILIFY) 20 MG tablet Take 20 mg by mouth daily.        Marland Kitchen aspirin EC 81 MG tablet Take 1 tablet (81 mg total) by mouth daily.  150 tablet  2  . azelastine (ASTELIN) 137 MCG/SPRAY nasal spray Place 1 spray into the nose 2 (two) times daily.  30 mL  5  . benzonatate (TESSALON) 100 MG capsule Take 1 capsule (100 mg total) by mouth 3 (three) times daily as needed for cough.  40 capsule  0  . DULoxetine (CYMBALTA) 60 MG capsule Take 60 mg by mouth 2 (two) times daily.       . fluticasone (FLONASE) 50 MCG/ACT nasal spray Place 2 sprays into the nose at bedtime.  16 g  5  . glucose blood (FREESTYLE  TEST STRIPS) test strip Use to test blood sugar 3 times daily  300 each  3  . hydrALAZINE (APRESOLINE) 50 MG tablet Take 50 mg by mouth 2 (two) times daily.      . Insulin Pen Needle 31G X 8 MM MISC Use as directed. Dx code: 250.00  100 each  2  . Lancet Devices MISC Use to test blood sugar 3 times daily  300 each  3  . levocetirizine (XYZAL) 5 MG tablet Take 1 tablet (5 mg total) by mouth every evening.  30 tablet  5  . liothyronine (CYTOMEL) 25 MCG tablet Take 25 mcg by mouth 2 (two) times daily.        Marland Kitchen lithium carbonate 150 MG capsule Take 150 mg by mouth 3 (three) times daily with meals.      Marland Kitchen LORazepam (ATIVAN) 1 MG tablet Take 1 mg by mouth as needed.        Marland Kitchen omeprazole (PRILOSEC) 20 MG capsule Take 1 capsule (20 mg total) by mouth 2 (two) times daily.  180 capsule  3  . rosuvastatin (CRESTOR) 40 MG tablet Take 1 tablet (40 mg total) by mouth daily.  90 tablet  3  . TAZTIA XT 360 MG 24 hr capsule TAKE (1) CAPSULE BY MOUTH  ONCE DAILY.  90 capsule  1  . nitroGLYCERIN (NITROSTAT) 0.4 MG SL tablet Place 1 tablet (0.4 mg total) under the tongue every 5 (five) minutes as needed for chest pain.  25 tablet  6   No current facility-administered medications on file prior to visit.    Allergies  Allergen Reactions  . Amoxicillin-Pot Clavulanate Nausea Only  . Codeine     REACTION: nausea  . Penicillins     REACTION: major hives,swelling    Family History  Problem Relation Age of Onset  . Coronary artery disease Brother     CABG 2  . Heart disease Brother   . Coronary artery disease Brother     CABG 58    BP 126/80  Pulse 88  Ht 5\' 5"  (1.651 m)  Wt 193 lb (87.544 kg)  BMI 32.12 kg/m2  SpO2 97%  Review of Systems denies hypoglycemia, but she has gained weight     Objective:   Physical Exam VITAL SIGNS:  See vs page GENERAL: no distress     Assessment & Plan:  DM: this insulin regimen is chosen from the available options, for the time being, for the purpose of dose-finding. A better regimen will be addressed later.

## 2013-04-21 NOTE — Patient Instructions (Addendum)
check your blood sugar twice a day.  vary the time of day when you check, between before the 3 meals, and at bedtime.  also check if you have symptoms of your blood sugar being too high or too low.  please keep a record of the readings and bring it to your next appointment here.  please call us sooner if your blood sugar goes below 70, or if you have a lot of readings over 200.   Increase the lantus to 200 units daily.  Please come back for a follow-up appointment in 2 weeks.   Cc dr Evelene Croon

## 2013-05-05 ENCOUNTER — Encounter: Payer: Self-pay | Admitting: Endocrinology

## 2013-05-05 ENCOUNTER — Ambulatory Visit (INDEPENDENT_AMBULATORY_CARE_PROVIDER_SITE_OTHER): Payer: Medicare Other | Admitting: Endocrinology

## 2013-05-05 VITALS — BP 122/78 | HR 78 | Wt 198.0 lb

## 2013-05-05 DIAGNOSIS — E1049 Type 1 diabetes mellitus with other diabetic neurological complication: Secondary | ICD-10-CM

## 2013-05-05 MED ORDER — INSULIN LISPRO 100 UNIT/ML (KWIKPEN): 20.0000 [IU] | PEN_INJECTOR | Freq: Three times a day (TID) | SUBCUTANEOUS | Status: DC

## 2013-05-05 NOTE — Patient Instructions (Addendum)
check your blood sugar twice a day.  vary the time of day when you check, between before the 3 meals, and at bedtime.  also check if you have symptoms of your blood sugar being too high or too low.  please keep a record of the readings and bring it to your next appointment here.  please call us sooner if your blood sugar goes below 70, or if you have a lot of readings over 200.   reduce the lantus to 180 units daily, and: Add humalog, 20 units 3 times a day (just before each meal).   Please come back for a follow-up appointment in 1 month.

## 2013-05-05 NOTE — Progress Notes (Signed)
Subjective:    Patient ID: Teresa Hart, female    DOB: [birth year], 62 y.o.   MRN: [id number]  HPI pt returns for f/u of insulin-requiring DM (dx'ed 2001; not in the context of pancreatitis; she has moderate neuropathy of the lower extremities, and associated CAD, PAD and renal failure). she brings a record of her cbg's which i have reviewed today.  It varies from 114-315, but most are in the high-100's.  Fatigue is less now.  She sometimes eats only 2 meals per day.   Past Medical History  Diagnosis Date  . HTN (hypertension)   . Hyperlipidemia   . GERD (gastroesophageal reflux disease)   . Obese   . Diabetes mellitus   . Hypothyroid   . Asthma   . Renal insufficiency   . Cardiac enlargement   . Aortic stenosis, mild     per echo Jan 2013; EF is 61%  . Carotid stenosis, bilateral     per doppler Jan 2013  . Abnormal nuclear stress test Jan 2013  . CAD (coronary artery disease) Jan 2013    with cardiac cath. Managed medically. No obstructive disease in the large caliber vessels, disease in the small OM2 and distal LAD; not amenable to PCI or grafting.     Past Surgical History  Procedure Laterality Date  . Abdominal hysterectomy    . Tonsillectomy    . Knee surgery    . Appendectomy    . Cardiac catheterization  Jan 2013    No major obstructive disease in the large caliber vessels, with disease in a small OM2 and distal LAD; not amenable to PCI or grafting.   . Knee arthroscopy  06/24/2012    Procedure: ARTHROSCOPY KNEE;  Surgeon: Nestor Lewandowsky, MD;  Location: [facility];  Service: Orthopedics;  Laterality: Right;  DEBRIDEMENT OF CHONDROMALACIA    History   Social History  . Marital Status: Married    Spouse Name: N/A    Number of Children: 0  . Years of Education: N/A   Occupational History  . Not on file.   Social History Main Topics  . Smoking status: Never Smoker   . Smokeless tobacco: Never Used  . Alcohol Use: No  . Drug Use: No  .  Sexually Active: Yes   Other Topics Concern  . Not on file   Social History Narrative  . No narrative on file    Current Outpatient Prescriptions on File Prior to Visit  Medication Sig Dispense Refill  . albuterol (PROVENTIL HFA;VENTOLIN HFA) 108 (90 BASE) MCG/ACT inhaler Inhale 2 puffs into the lungs every 6 (six) hours as needed.  18 g  2  . ARIPiprazole (ABILIFY) 20 MG tablet Take 20 mg by mouth daily.        Marland Kitchen aspirin EC 81 MG tablet Take 1 tablet (81 mg total) by mouth daily.  150 tablet  2  . azelastine (ASTELIN) 137 MCG/SPRAY nasal spray Place 1 spray into the nose 2 (two) times daily.  30 mL  5  . benzonatate (TESSALON) 100 MG capsule Take 1 capsule (100 mg total) by mouth 3 (three) times daily as needed for cough.  40 capsule  0  . DULoxetine (CYMBALTA) 60 MG capsule Take 60 mg by mouth 2 (two) times daily.       . fluticasone (FLONASE) 50 MCG/ACT nasal spray Place 2 sprays into the nose at bedtime.  16 g  5  . glucose blood (FREESTYLE TEST STRIPS) test  strip Use to test blood sugar 3 times daily  300 each  3  . hydrALAZINE (APRESOLINE) 50 MG tablet Take 50 mg by mouth 2 (two) times daily.      . Insulin Pen Needle 31G X 8 MM MISC Use as directed. Dx code: 250.00  100 each  2  . Lancet Devices MISC Use to test blood sugar 3 times daily  300 each  3  . levocetirizine (XYZAL) 5 MG tablet Take 1 tablet (5 mg total) by mouth every evening.  30 tablet  5  . liothyronine (CYTOMEL) 25 MCG tablet Take 25 mcg by mouth 2 (two) times daily.        Marland Kitchen lithium carbonate 150 MG capsule Take 150 mg by mouth 3 (three) times daily with meals.      Marland Kitchen LORazepam (ATIVAN) 1 MG tablet Take 1 mg by mouth as needed.        Marland Kitchen omeprazole (PRILOSEC) 20 MG capsule Take 1 capsule (20 mg total) by mouth 2 (two) times daily.  180 capsule  3  . rosuvastatin (CRESTOR) 40 MG tablet Take 1 tablet (40 mg total) by mouth daily.  90 tablet  3  . TAZTIA XT 360 MG 24 hr capsule TAKE (1) CAPSULE BY MOUTH ONCE DAILY.  90  capsule  1  . nitroGLYCERIN (NITROSTAT) 0.4 MG SL tablet Place 1 tablet (0.4 mg total) under the tongue every 5 (five) minutes as needed for chest pain.  25 tablet  6   No current facility-administered medications on file prior to visit.    Allergies  Allergen Reactions  . Amoxicillin-Pot Clavulanate Nausea Only  . Codeine     REACTION: nausea  . Penicillins     REACTION: major hives,swelling   Family History  Problem Relation Age of Onset  . Coronary artery disease Brother     CABG 69  . Heart disease Brother   . Coronary artery disease Brother     CABG 58   BP 122/78  Pulse 78  Wt 198 lb (89.812 kg)  BMI 32.95 kg/m2  SpO2 98%  Review of Systems denies hypoglycemia, but she has gained weight.    Objective:   Physical Exam VITAL SIGNS:  See vs page GENERAL: no distress     Assessment & Plan:  DM: she needs increased rx.  This insulin regimen was chosen from multiple options, as it best matches her insulin to her changing requirements throughout the day.  The benefits of glycemic control must be weighed against the risks of hypoglycemia.

## 2013-05-31 ENCOUNTER — Telehealth: Payer: Self-pay | Admitting: Endocrinology

## 2013-05-31 MED ORDER — INSULIN LISPRO 100 UNIT/ML (KWIKPEN): 20.0000 [IU] | PEN_INJECTOR | Freq: Three times a day (TID) | SUBCUTANEOUS | Status: DC

## 2013-06-01 ENCOUNTER — Telehealth: Payer: Self-pay | Admitting: Endocrinology

## 2013-06-01 MED ORDER — INSULIN GLARGINE 100 UNIT/ML SOLOSTAR PEN: PEN_INJECTOR | SUBCUTANEOUS | Status: DC

## 2013-06-01 MED ORDER — INSULIN PEN NEEDLE 31G X 8 MM MISC: Status: DC

## 2013-06-01 NOTE — Telephone Encounter (Signed)
Pt called yesterday, will run out of Lantus tomorrow. Needs refill . Uses [facility] in [place]. / Oneita Kras.

## 2013-06-04 ENCOUNTER — Ambulatory Visit: Payer: Medicare Other | Admitting: Endocrinology

## 2013-06-29 ENCOUNTER — Ambulatory Visit: Payer: Medicare Other | Admitting: Endocrinology

## 2013-08-03 ENCOUNTER — Other Ambulatory Visit: Payer: Self-pay | Admitting: Endocrinology

## 2013-09-30 NOTE — Telephone Encounter (Signed)
completed

## 2013-10-06 ENCOUNTER — Other Ambulatory Visit: Payer: Self-pay | Admitting: Endocrinology

## 2013-10-18 ENCOUNTER — Other Ambulatory Visit: Payer: Self-pay

## 2013-10-18 MED ORDER — INSULIN PEN NEEDLE 31G X 8 MM MISC: Status: DC

## 2013-10-18 NOTE — Telephone Encounter (Signed)
Ulticare Pen Needles reorder. /met.

## 2014-01-03 ENCOUNTER — Other Ambulatory Visit: Payer: Self-pay

## 2014-01-03 MED ORDER — INSULIN GLARGINE 100 UNIT/ML SOLOSTAR PEN: PEN_INJECTOR | SUBCUTANEOUS | Status: DC

## 2014-02-14 ENCOUNTER — Other Ambulatory Visit: Payer: Self-pay | Admitting: Family Medicine

## 2014-05-23 ENCOUNTER — Other Ambulatory Visit: Payer: Self-pay | Admitting: Endocrinology

## 2014-05-31 ENCOUNTER — Ambulatory Visit: Payer: Medicare Other | Admitting: Cardiology

## 2014-06-10 ENCOUNTER — Other Ambulatory Visit: Payer: Self-pay | Admitting: Endocrinology

## 2014-08-15 ENCOUNTER — Other Ambulatory Visit: Payer: Self-pay | Admitting: Endocrinology

## 2014-09-14 ENCOUNTER — Other Ambulatory Visit: Payer: Self-pay | Admitting: Endocrinology

## 2014-10-12 ENCOUNTER — Encounter: Payer: Medicare Other | Attending: "Endocrinology | Admitting: Nutrition

## 2014-10-12 VITALS — Ht 65.0 in | Wt 210.0 lb

## 2014-10-12 DIAGNOSIS — IMO0002 Reserved for concepts with insufficient information to code with codable children: Secondary | ICD-10-CM

## 2014-10-12 DIAGNOSIS — E118 Type 2 diabetes mellitus with unspecified complications: Secondary | ICD-10-CM | POA: Diagnosis present

## 2014-10-12 DIAGNOSIS — Z713 Dietary counseling and surveillance: Secondary | ICD-10-CM | POA: Diagnosis not present

## 2014-10-12 DIAGNOSIS — E1165 Type 2 diabetes mellitus with hyperglycemia: Secondary | ICD-10-CM

## 2014-10-12 NOTE — Progress Notes (Signed)
  Medical Nutrition Therapy:  Appt start time: 1100 end time:  1200.   Assessment:  Primary concerns today: Diabetes. Here to see Dr. Fransico HimNida today.Most recent A1C  Down to 8.7%  from 11.9%. She is on Victoza. It is being increased to 1.8 mg/dl. Willing to work on diet and exercise to help improve blood sugars. Admits to not eating on a schedule and sometimes not taking Humalog at lunch if she is away from home for meal. Sleeps in late in the morning.   Preferred Learning Style:   AHands on  No preference indicated   Learning Readiness:   Ready  Change in progress   MEDICATIONS: See List.    DIETARY INTAKE:   24-hr recall:  B ( AM): Sometimes skips or Toast 1 slice OR Oatmeal-plain 1 pkt, water Snk ( AM):none  L ( PM): Backed chicken, vegetables, starch, water Snk ( PM): none D ( PM): PIzza- slices, water Snk ( PM): none Beverages: water,  Usual physical activity: walking and water aerobics.  Estimated energy needs: 1500 calories 170 g carbohydrates 112 g protein 42 g fat  Progress Towards Goal(s):  In progress.   Nutritional Diagnosis:  NB-1.1 Food and nutrition-related knowledge deficit As related to Diabetes.  As evidenced by A1C 8.7%..    Intervention:  Nutrition counseling and diabetes education . Plan:  Aim for 2-3 Carb Choices per meal (30-45 grams) +/- 1 either way  No snacks between meals Include protein in moderation with your meals and snacks Consider reading food labels for Total Carbohydrate and Fat Grams of foods Consider  increasing your activity level by 30 minutes daily as tolerated for needed weight loss. Test blood sugars before meals and 2 hours after largest meal of the day. Be sure to take medication of Lantus at night and Humalog with with meals  And then Victoza as directed by MD Goal: Eat three balanced meals per day as discussed. 2. Measure foods out for portion control. 3. Test and inject Humalog before meals to assist with improved  blood sugars. 4.Lose 1 l b per week. 5. Increase water intake. 6. Keep food journal and BS log and bring at each visit.  Teaching Method Utilized:  Visual Auditory Hands on  Handouts given during visit include: The Plate Method Carb Counting and Food Label handouts Meal Plan Card  Barriers to learning/adherence to lifestyle change: none  Demonstrated degree of understanding via:  Teach Back   Monitoring/Evaluation:  Dietary intake, exercise, meal planning, SBG, and body weight in three months.

## 2014-10-17 NOTE — Patient Instructions (Signed)
Plan:  Aim for 2-3 Carb Choices per meal (30-45 grams) +/- 1 either way  No snacks between meals Include protein in moderation with your meals and snacks Consider reading food labels for Total Carbohydrate and Fat Grams of foods Consider  increasing your activity level by 30 minutes daily as tolerated for needed weight loss. Test blood sugars before meals and 2 hours after largest meal of the day. Be sure to take medication of Lantus at night and Humalog with with meals  And then Victoza as directed by MD Goal: Eat three balanced meals per day as discussed. 2. Measure foods out for portion control. 3. Test and inject Humalog before meals to assist with improved blood sugars. 4.Lose 1 l b per week. 5. Increase water intake. 6. Keep food journal and BS log and bring at each visit.

## 2015-01-06 ENCOUNTER — Encounter: Payer: Medicaid Other | Admitting: Nutrition

## 2015-02-23 ENCOUNTER — Ambulatory Visit (HOSPITAL_COMMUNITY)
Admission: RE | Admit: 2015-02-23 | Discharge: 2015-02-23 | Disposition: A | Discharge status: Home / Self Care | Payer: Medicare Other | Source: Ambulatory Visit | Attending: Physician Assistant | Admitting: Physician Assistant

## 2015-02-23 ENCOUNTER — Other Ambulatory Visit (HOSPITAL_COMMUNITY): Payer: Self-pay | Admitting: Physician Assistant

## 2015-02-23 DIAGNOSIS — Z86718 Personal history of other venous thrombosis and embolism: Secondary | ICD-10-CM | POA: Diagnosis not present

## 2015-02-23 DIAGNOSIS — M79604 Pain in right leg: Secondary | ICD-10-CM

## 2015-02-23 DIAGNOSIS — M79661 Pain in right lower leg: Secondary | ICD-10-CM | POA: Insufficient documentation

## 2015-02-23 DIAGNOSIS — M79605 Pain in left leg: Secondary | ICD-10-CM

## 2015-07-03 ENCOUNTER — Other Ambulatory Visit: Payer: Self-pay

## 2015-07-03 DIAGNOSIS — Z1231 Encounter for screening mammogram for malignant neoplasm of breast: Secondary | ICD-10-CM

## 2015-07-06 ENCOUNTER — Ambulatory Visit
Admission: RE | Admit: 2015-07-06 | Discharge: 2015-07-06 | Disposition: A | Discharge status: Home / Self Care | Payer: Medicare Other | Source: Ambulatory Visit

## 2015-07-06 DIAGNOSIS — Z1231 Encounter for screening mammogram for malignant neoplasm of breast: Secondary | ICD-10-CM

## 2015-09-11 ENCOUNTER — Other Ambulatory Visit: Payer: Self-pay | Admitting: "Endocrinology

## 2015-09-21 ENCOUNTER — Other Ambulatory Visit: Payer: Self-pay | Admitting: "Endocrinology

## 2015-11-15 ENCOUNTER — Ambulatory Visit: Payer: Self-pay | Admitting: "Endocrinology

## 2015-11-30 ENCOUNTER — Ambulatory Visit (INDEPENDENT_AMBULATORY_CARE_PROVIDER_SITE_OTHER): Payer: Medicare Other | Admitting: "Endocrinology

## 2015-11-30 ENCOUNTER — Encounter: Payer: Self-pay | Admitting: "Endocrinology

## 2015-11-30 VITALS — BP 140/78 | HR 81 | Ht 63.0 in | Wt 216.0 lb

## 2015-11-30 DIAGNOSIS — E785 Hyperlipidemia, unspecified: Secondary | ICD-10-CM | POA: Diagnosis not present

## 2015-11-30 DIAGNOSIS — I10 Essential (primary) hypertension: Secondary | ICD-10-CM

## 2015-11-30 DIAGNOSIS — N183 Chronic kidney disease, stage 3 unspecified: Secondary | ICD-10-CM

## 2015-11-30 DIAGNOSIS — E038 Other specified hypothyroidism: Secondary | ICD-10-CM | POA: Diagnosis not present

## 2015-11-30 DIAGNOSIS — E1122 Type 2 diabetes mellitus with diabetic chronic kidney disease: Secondary | ICD-10-CM | POA: Diagnosis not present

## 2015-11-30 MED ORDER — VITAMIN D (ERGOCALCIFEROL) 1.25 MG (50000 UNIT) PO CAPS: 50000.0000 [IU] | ORAL_CAPSULE | ORAL | Status: DC

## 2015-11-30 NOTE — Patient Instructions (Signed)

## 2015-11-30 NOTE — Progress Notes (Signed)
Subjective:    Patient ID: Teresa Hart, female    DOB: 05/09/1951, PCP Abbe AmsterdamOPLAND,JESSICA, MD   Past Medical History  Diagnosis Date  . HTN (hypertension)   . Hyperlipidemia   . GERD (gastroesophageal reflux disease)   . Obese   . Diabetes mellitus   . Hypothyroid   . Asthma   . Renal insufficiency   . Cardiac enlargement   . Aortic stenosis, mild     per echo Jan 2013; EF is 61%  . Carotid stenosis, bilateral     per doppler Jan 2013  . Abnormal nuclear stress test Jan 2013  . CAD (coronary artery disease) Jan 2013    with cardiac cath. Managed medically. No obstructive disease in the large caliber vessels, disease in the small OM2 and distal LAD; not amenable to PCI or grafting.    Past Surgical History  Procedure Laterality Date  . Abdominal hysterectomy    . Tonsillectomy    . Knee surgery    . Appendectomy    . Cardiac catheterization  Jan 2013    No major obstructive disease in the large caliber vessels, with disease in a small OM2 and distal LAD; not amenable to PCI or grafting.   . Knee arthroscopy  06/24/2012    Procedure: ARTHROSCOPY KNEE;  Surgeon: Nestor LewandowskyFrank J Rowan, MD;  Location: [facility];  Service: Orthopedics;  Laterality: Right;  DEBRIDEMENT OF CHONDROMALACIA   Social History   Social History  . Marital Status: Married    Spouse Name: N/A  . Number of Children: 0  . Years of Education: N/A   Social History Main Topics  . Smoking status: Never Smoker   . Smokeless tobacco: Never Used  . Alcohol Use: No  . Drug Use: No  . Sexual Activity: Yes   Other Topics Concern  . None   Social History Narrative   Outpatient Encounter Prescriptions as of 11/30/2015  Medication Sig  . HUMALOG KWIKPEN 100 UNIT/ML SOPN INJECT 20 UNITS S.Q. THREE TIMES A DAY WITH MEALS.  Marland Kitchen. Insulin Glargine (LANTUS SOLOSTAR) 100 UNIT/ML Solostar Pen INJECT 180 UNITS DAILY AS DIRECTED. (Patient taking differently: Inject 100 Units into the skin at bedtime. INJECT  180 UNITS DAILY AS DIRECTED.)  . Liraglutide (VICTOZA) 18 MG/3ML SOPN Inject 1.8 mg into the skin daily.  Marland Kitchen. ACCU-CHEK AVIVA PLUS test strip CHECK BLOOD SUGAR 4 TIMES DAILY.  Marland Kitchen. albuterol (PROVENTIL HFA;VENTOLIN HFA) 108 (90 BASE) MCG/ACT inhaler Inhale 2 puffs into the lungs every 6 (six) hours as needed.  . ARIPiprazole (ABILIFY) 20 MG tablet Take 20 mg by mouth daily.    Marland Kitchen. aspirin EC 81 MG tablet Take 1 tablet (81 mg total) by mouth daily.  Marland Kitchen. azelastine (ASTELIN) 137 MCG/SPRAY nasal spray Place 1 spray into the nose 2 (two) times daily. (Patient not taking: Reported on 10/12/2014)  . benzonatate (TESSALON) 100 MG capsule Take 1 capsule (100 mg total) by mouth 3 (three) times daily as needed for cough. (Patient not taking: Reported on 10/12/2014)  . DULoxetine (CYMBALTA) 60 MG capsule Take 60 mg by mouth 2 (two) times daily.   . fluticasone (FLONASE) 50 MCG/ACT nasal spray Place 2 sprays into the nose at bedtime.  . hydrALAZINE (APRESOLINE) 50 MG tablet Take 50 mg by mouth 2 (two) times daily.  Marland Kitchen. levocetirizine (XYZAL) 5 MG tablet Take 1 tablet (5 mg total) by mouth every evening.  Marland Kitchen. liothyronine (CYTOMEL) 25 MCG tablet Take 25 mcg by mouth 2 (  two) times daily.    . Liraglutide (VICTOZA) 18 MG/3ML SOPN Inject 1.8 Units into the skin.  Marland Kitchen lithium carbonate 150 MG capsule Take 150 mg by mouth 3 (three) times daily with meals.  Marland Kitchen LORazepam (ATIVAN) 1 MG tablet Take 1 mg by mouth as needed.    . nitroGLYCERIN (NITROSTAT) 0.4 MG SL tablet Place 1 tablet (0.4 mg total) under the tongue every 5 (five) minutes as needed for chest pain.  Marland Kitchen omeprazole (PRILOSEC) 20 MG capsule Take 1 capsule (20 mg total) by mouth 2 (two) times daily.  . rosuvastatin (CRESTOR) 40 MG tablet Take 1 tablet (40 mg total) by mouth daily.  Marland Kitchen TAZTIA XT 360 MG 24 hr capsule TAKE (1) CAPSULE BY MOUTH ONCE DAILY.  Marland Kitchen ULTICARE SHORT PEN NEEDLES 31G X 8 MM MISC USE AS DIRECTED  . ULTILET CLASSIC LANCETS MISC Test blood sugar 3 times  daily. PATIENT NEEDS OFFICE VISIT FOR ADDITIONAL REFILLS  . Vitamin D, Ergocalciferol, (DRISDOL) 50000 units CAPS capsule Take 1 capsule (50,000 Units total) by mouth every 7 (seven) days.  Marland Kitchen ZOSTAVAX 13086 UNT/0.65ML injection   . [DISCONTINUED] HUMALOG KWIKPEN 200 UNIT/ML SOPN INJECT UP TO 24 UNITS S.Q. THREE TIMES A DAY BEFORE MEALS AS DIRECTED.  . [DISCONTINUED] Insulin Glargine 100 UNIT/ML SOPN Inject 180 Units into the skin daily. And pen needles 1/day   No facility-administered encounter medications on file as of 11/30/2015.   ALLERGIES: Allergies  Allergen Reactions  . Amoxicillin-Pot Clavulanate Nausea Only  . Codeine     REACTION: nausea  . Penicillins     REACTION: major hives,swelling   VACCINATION STATUS: Immunization History  Administered Date(s) Administered  . Influenza Split 08/05/2012  . Tdap 03/15/2013  . Zoster 03/16/2013    Diabetes She presents for her follow-up diabetic visit. She has type 2 diabetes mellitus. Onset time: She was diagnosed at approximate age of 46 years. Her disease course has been improving. There are no hypoglycemic associated symptoms. Pertinent negatives for hypoglycemia include no confusion, headaches, pallor or seizures. There are no diabetic associated symptoms. Pertinent negatives for diabetes include no chest pain, no polydipsia, no polyphagia and no polyuria. There are no hypoglycemic complications. Symptoms are improving. Diabetic complications include heart disease and nephropathy. Risk factors for coronary artery disease include diabetes mellitus, dyslipidemia, hypertension, sedentary lifestyle and obesity. Current diabetic treatment includes intensive insulin program. She is compliant with treatment most of the time. Her weight is stable. She is following a generally unhealthy diet. When asked about meal planning, she reported none. She has had a previous visit with a dietitian. She rarely participates in exercise. Her home blood glucose  trend is decreasing steadily. Her breakfast blood glucose range is generally 180-200 mg/dl. Her lunch blood glucose range is generally 180-200 mg/dl. Her dinner blood glucose range is generally 180-200 mg/dl. Her overall blood glucose range is 180-200 mg/dl. Eye exam is current.  Hyperlipidemia This is a chronic problem. The current episode started more than 1 year ago. The problem is uncontrolled. Recent lipid tests were reviewed and are high. Exacerbating diseases include diabetes and obesity. Associated symptoms include leg pain. Pertinent negatives include no chest pain, myalgias or shortness of breath. Current antihyperlipidemic treatment includes statins. The current treatment provides no improvement of lipids. Risk factors for coronary artery disease include diabetes mellitus, dyslipidemia, hypertension, obesity and a sedentary lifestyle.  Hypertension This is a chronic problem. The current episode started more than 1 year ago. The problem is controlled. Pertinent negatives include  no chest pain, headaches, palpitations or shortness of breath. Risk factors for coronary artery disease include dyslipidemia, diabetes mellitus, obesity and sedentary lifestyle. Past treatments include direct vasodilators. Hypertensive end-organ damage includes kidney disease and CAD/MI.     Review of Systems  Constitutional: Negative for fever, chills and unexpected weight change.  HENT: Negative for trouble swallowing and voice change.   Eyes: Negative for visual disturbance.  Respiratory: Negative for cough, shortness of breath and wheezing.   Cardiovascular: Negative for chest pain, palpitations and leg swelling.  Gastrointestinal: Negative for nausea, vomiting and diarrhea.  Endocrine: Negative for cold intolerance, heat intolerance, polydipsia, polyphagia and polyuria.  Musculoskeletal: Negative for myalgias and arthralgias.  Skin: Negative for color change, pallor, rash and wound.  Neurological: Negative  for seizures and headaches.  Psychiatric/Behavioral: Negative for suicidal ideas and confusion.    Objective:    BP 140/78 mmHg  Pulse 81  Ht 5\' 3"  (1.6 m)  Wt 216 lb (97.977 kg)  BMI 38.27 kg/m2  SpO2 97%  Wt Readings from Last 3 Encounters:  11/30/15 216 lb (97.977 kg)  10/12/14 210 lb (95.255 kg)  05/05/13 198 lb (89.812 kg)    Physical Exam  Constitutional: She is oriented to person, place, and time. She appears well-developed.  HENT:  Head: Normocephalic and atraumatic.  Eyes: EOM are normal.  Neck: Normal range of motion. Neck supple. No tracheal deviation present. No thyromegaly present.  Cardiovascular: Normal rate and regular rhythm.   Pulmonary/Chest: Effort normal and breath sounds normal.  Abdominal: Soft. Bowel sounds are normal. There is no tenderness. There is no guarding.  Musculoskeletal: Normal range of motion. She exhibits no edema.  Neurological: She is alert and oriented to person, place, and time. She has normal reflexes. No cranial nerve deficit. Coordination normal.  Skin: Skin is warm and dry. No rash noted. No erythema. No pallor.  Psychiatric: She has a normal mood and affect. Judgment normal.     CMP ( most recent) CMP     Component Value Date/Time   NA 131* 02/18/2013 1047   K 5.1 02/18/2013 1047   CL 97 02/18/2013 1047   CO2 27 02/18/2013 1047   GLUCOSE 309* 02/18/2013 1047   BUN 16 02/18/2013 1047   CREATININE 1.25* 02/18/2013 1047   CREATININE 1.25* 06/19/2012 1500   CREATININE 1.42* 10/01/2010 1344   CALCIUM 10.5 02/18/2013 1047   PROT 6.9 02/18/2013 1047   ALBUMIN 4.9 02/18/2013 1047   AST 15 02/18/2013 1047   ALT 18 02/18/2013 1047   ALKPHOS 118* 02/18/2013 1047   BILITOT 0.4 02/18/2013 1047   GFRNONAA 46* 06/19/2012 1500   GFRAA 53* 06/19/2012 1500     Diabetic Labs (most recent): Lab Results  Component Value Date   HGBA1C 8.5 03/15/2013   HGBA1C 9.7 02/18/2013   HGBA1C 7.5 11/16/2012     Lipid Panel ( most  recent) Lipid Panel     Component Value Date/Time   CHOL 323* 02/18/2013 1047   TRIG 310* 02/18/2013 1047   HDL 55 02/18/2013 1047   CHOLHDL 5.9 02/18/2013 1047   VLDL 62* 02/18/2013 1047   LDLCALC 206* 02/18/2013 1047      Assessment & Plan:   1. Diabetes mellitus with stage 3 chronic kidney disease (HCC)  -her diabetes is  complicated by stage III chronic kidney disease and coronary artery disease and patient remains at a high risk for more acute and chronic complications of diabetes which include CAD, CVA, CKD, retinopathy, and neuropathy.  These are all discussed in detail with the patient.  Patient came with improved glucose profile, and  recent A1c of 7.4% generally improving from 9.1 %.  Glucose logs and insulin administration records pertaining to this visit,  to be scanned into patient's records.  Recent labs reviewed.   - I have re-counseled the patient on diet management and weight loss  by adopting a carbohydrate restricted / protein rich  Diet.  - Suggestion is made for patient to avoid simple carbohydrates   from their diet including Cakes , Desserts, Ice Cream,  Soda (  diet and regular) , Sweet Tea , Candies,  Chips, Cookies, Artificial Sweeteners,   and "Sugar-free" Products .  This will help patient to have stable blood glucose profile and potentially avoid unintended  Weight gain.  - Patient is advised to stick to a routine mealtimes to eat 3 meals  a day and avoid unnecessary snacks ( to snack only to correct hypoglycemia).  - The patient  has been  scheduled with Norm Salt, RDN, CDE for individualized DM education.  - I have approached patient with the following individualized plan to manage diabetes and patient agrees.  - I will proceed with basal insulin Lantus 100 units QHS, and prandial insulin Humalog 20 units TIDAC for pre-meal BG readings of 90-150mg /dl, plus patient specific correction dose of rapid acting insulin  for unexpected hyperglycemia above  150mg /dl, associated with strict monitoring of glucose  AC and HS. - Patient is warned not to take insulin without proper monitoring per orders. -Adjustment parameters are given for hypo and hyperglycemia in writing. -Patient is encouraged to call clinic for blood glucose levels less than 70 or above 300 mg /dl. - I will continue Victoza 1.8 mg subcutaneously daily, therapeutically suitable for patient. -Patient is not a candidate for metformin and insert inhibitors due to CKD. -Since she has achieved reasonable control of diabetes with A1c of 7.4%, she would not need U500 insulin for now. - Patient specific target  for A1c; LDL, HDL, Triglycerides, and  Waist Circumference were discussed in detail.  2) BP/HTN: Controlled. Continue current medications including ACEI/ARB. 3) Lipids/HPL:  continue statins. 4)  Weight/Diet: CDE consult in progress, exercise, and carbohydrates information provided.  5) Chronic Care/Health Maintenance:  -Patient is on ACEI/ARB and Statin medications and encouraged to continue to follow up with Ophthalmology, Podiatrist at least yearly or according to recommendations, and advised to  stay away from smoking. I have recommended yearly flu vaccine and pneumonia vaccination at least every 5 years; moderate intensity exercise for up to 150 minutes weekly; and  sleep for at least 7 hours a day.  - 25 minutes of time was spent on the care of this patient , 50% of which was applied for counseling on diabetes complications and their preventions.  - I advised patient to maintain close follow up with [facility], MD for primary care needs.  Patient is asked to bring meter and  blood glucose logs during their next visit.   Follow up plan: -Return in about 3 months (around 02/28/2016) for diabetes, high blood pressure, high cholesterol, underactive thyroid, follow up with pre-visit labs, meter, and logs.  Marquis Lunch, MD Phone: [phone]  Fax: [phone]   11/30/2015,  4:52 PM

## 2015-12-08 ENCOUNTER — Encounter: Payer: Self-pay | Admitting: Family Medicine

## 2015-12-13 ENCOUNTER — Encounter: Payer: Self-pay | Admitting: Family Medicine

## 2016-02-02 ENCOUNTER — Other Ambulatory Visit (HOSPITAL_COMMUNITY): Payer: Medicare Other

## 2016-02-02 ENCOUNTER — Emergency Department (HOSPITAL_COMMUNITY): Payer: Medicare Other

## 2016-02-02 ENCOUNTER — Inpatient Hospital Stay (HOSPITAL_COMMUNITY)
Admission: EM | Admit: 2016-02-02 | Discharge: 2016-02-06 | DRG: 247 | Disposition: A | Discharge status: Home / Self Care | Payer: Medicare Other | Attending: Cardiovascular Disease | Admitting: Cardiovascular Disease

## 2016-02-02 ENCOUNTER — Encounter (HOSPITAL_COMMUNITY): Payer: Self-pay

## 2016-02-02 DIAGNOSIS — E1122 Type 2 diabetes mellitus with diabetic chronic kidney disease: Secondary | ICD-10-CM | POA: Diagnosis present

## 2016-02-02 DIAGNOSIS — Z955 Presence of coronary angioplasty implant and graft: Secondary | ICD-10-CM

## 2016-02-02 DIAGNOSIS — Z794 Long term (current) use of insulin: Secondary | ICD-10-CM | POA: Diagnosis not present

## 2016-02-02 DIAGNOSIS — I350 Nonrheumatic aortic (valve) stenosis: Secondary | ICD-10-CM

## 2016-02-02 DIAGNOSIS — Z79899 Other long term (current) drug therapy: Secondary | ICD-10-CM

## 2016-02-02 DIAGNOSIS — I214 Non-ST elevation (NSTEMI) myocardial infarction: Principal | ICD-10-CM

## 2016-02-02 DIAGNOSIS — Z7951 Long term (current) use of inhaled steroids: Secondary | ICD-10-CM | POA: Diagnosis not present

## 2016-02-02 DIAGNOSIS — N183 Chronic kidney disease, stage 3 unspecified: Secondary | ICD-10-CM | POA: Diagnosis present

## 2016-02-02 DIAGNOSIS — K219 Gastro-esophageal reflux disease without esophagitis: Secondary | ICD-10-CM | POA: Diagnosis present

## 2016-02-02 DIAGNOSIS — Z6835 Body mass index (BMI) 35.0-35.9, adult: Secondary | ICD-10-CM | POA: Diagnosis not present

## 2016-02-02 DIAGNOSIS — Z7982 Long term (current) use of aspirin: Secondary | ICD-10-CM

## 2016-02-02 DIAGNOSIS — R001 Bradycardia, unspecified: Secondary | ICD-10-CM | POA: Diagnosis not present

## 2016-02-02 DIAGNOSIS — R0683 Snoring: Secondary | ICD-10-CM | POA: Diagnosis present

## 2016-02-02 DIAGNOSIS — I119 Hypertensive heart disease without heart failure: Secondary | ICD-10-CM | POA: Diagnosis present

## 2016-02-02 DIAGNOSIS — D649 Anemia, unspecified: Secondary | ICD-10-CM | POA: Diagnosis not present

## 2016-02-02 DIAGNOSIS — I2510 Atherosclerotic heart disease of native coronary artery without angina pectoris: Secondary | ICD-10-CM | POA: Diagnosis present

## 2016-02-02 DIAGNOSIS — E785 Hyperlipidemia, unspecified: Secondary | ICD-10-CM | POA: Diagnosis present

## 2016-02-02 DIAGNOSIS — I6523 Occlusion and stenosis of bilateral carotid arteries: Secondary | ICD-10-CM | POA: Diagnosis present

## 2016-02-02 DIAGNOSIS — E039 Hypothyroidism, unspecified: Secondary | ICD-10-CM | POA: Diagnosis present

## 2016-02-02 DIAGNOSIS — F319 Bipolar disorder, unspecified: Secondary | ICD-10-CM | POA: Diagnosis present

## 2016-02-02 DIAGNOSIS — I1310 Hypertensive heart and chronic kidney disease without heart failure, with stage 1 through stage 4 chronic kidney disease, or unspecified chronic kidney disease: Secondary | ICD-10-CM | POA: Diagnosis present

## 2016-02-02 DIAGNOSIS — I129 Hypertensive chronic kidney disease with stage 1 through stage 4 chronic kidney disease, or unspecified chronic kidney disease: Secondary | ICD-10-CM | POA: Diagnosis present

## 2016-02-02 DIAGNOSIS — I10 Essential (primary) hypertension: Secondary | ICD-10-CM | POA: Diagnosis not present

## 2016-02-02 HISTORY — DX: Hypertensive heart disease without heart failure: I11.9

## 2016-02-02 HISTORY — DX: Chronic kidney disease, stage 3 unspecified: N18.30

## 2016-02-02 HISTORY — DX: Type 1 diabetes mellitus without complications: E10.9

## 2016-02-02 HISTORY — DX: Chronic kidney disease, stage 3 (moderate): N18.3

## 2016-02-02 LAB — CBC WITH DIFFERENTIAL/PLATELET
BASOS ABS: 0 10*3/uL (ref 0.0–0.1)
Basophils Relative: 0 %
EOS PCT: 2 %
Eosinophils Absolute: 0.3 10*3/uL (ref 0.0–0.7)
HEMATOCRIT: 38 % (ref 36.0–46.0)
HEMOGLOBIN: 12.2 g/dL (ref 12.0–15.0)
LYMPHS PCT: 19 %
Lymphs Abs: 2.7 10*3/uL (ref 0.7–4.0)
MCH: 29.7 pg (ref 26.0–34.0)
MCHC: 32.1 g/dL (ref 30.0–36.0)
MCV: 92.5 fL (ref 78.0–100.0)
MONO ABS: 1 10*3/uL (ref 0.1–1.0)
Monocytes Relative: 7 %
Neutro Abs: 10.3 10*3/uL — ABNORMAL HIGH (ref 1.7–7.7)
Neutrophils Relative %: 72 %
Platelets: 322 10*3/uL (ref 150–400)
RBC: 4.11 MIL/uL (ref 3.87–5.11)
RDW: 15.6 % — AB (ref 11.5–15.5)
WBC: 14.2 10*3/uL — ABNORMAL HIGH (ref 4.0–10.5)

## 2016-02-02 LAB — BASIC METABOLIC PANEL
ANION GAP: 12 (ref 5–15)
BUN: 14 mg/dL (ref 6–20)
CALCIUM: 9.6 mg/dL (ref 8.9–10.3)
CHLORIDE: 102 mmol/L (ref 101–111)
CO2: 26 mmol/L (ref 22–32)
Creatinine, Ser: 1.85 mg/dL — ABNORMAL HIGH (ref 0.44–1.00)
GFR calc Af Amer: 32 mL/min — ABNORMAL LOW (ref >60)
GFR calc non Af Amer: 28 mL/min — ABNORMAL LOW (ref >60)
GLUCOSE: 160 mg/dL — AB (ref 65–99)
Potassium: 4.2 mmol/L (ref 3.5–5.1)
Sodium: 140 mmol/L (ref 135–145)

## 2016-02-02 LAB — CBG MONITORING, ED: GLUCOSE-CAPILLARY: 145 mg/dL — AB (ref 65–99)

## 2016-02-02 LAB — APTT: APTT: 27 s (ref 24–37)

## 2016-02-02 LAB — TROPONIN I
Troponin I: 4.42 ng/mL (ref <0.031)
Troponin I: 5.07 ng/mL (ref <0.031)
Troponin I: 5.11 ng/mL (ref <0.031)
Troponin I: 8.39 ng/mL (ref <0.031)

## 2016-02-02 LAB — GLUCOSE, CAPILLARY
GLUCOSE-CAPILLARY: 137 mg/dL — AB (ref 65–99)
GLUCOSE-CAPILLARY: 165 mg/dL — AB (ref 65–99)
Glucose-Capillary: 137 mg/dL — ABNORMAL HIGH (ref 65–99)
Glucose-Capillary: 136 mg/dL — ABNORMAL HIGH (ref 65–99)

## 2016-02-02 LAB — HEPARIN LEVEL (UNFRACTIONATED)
HEPARIN UNFRACTIONATED: 0.26 [IU]/mL — AB (ref 0.30–0.70)
HEPARIN UNFRACTIONATED: 0.19 [IU]/mL — AB (ref 0.30–0.70)

## 2016-02-02 LAB — BRAIN NATRIURETIC PEPTIDE: B NATRIURETIC PEPTIDE 5: 63 pg/mL (ref 0.0–100.0)

## 2016-02-02 LAB — PROTIME-INR
INR: 0.95 (ref 0.00–1.49)
PROTHROMBIN TIME: 12.9 s (ref 11.6–15.2)

## 2016-02-02 LAB — TSH: TSH: 3.918 u[IU]/mL (ref 0.350–4.500)

## 2016-02-02 LAB — MRSA PCR SCREENING: MRSA BY PCR: NEGATIVE

## 2016-02-02 MED ORDER — CETYLPYRIDINIUM CHLORIDE 0.05 % MT LIQD
7.0000 mL | Freq: Two times a day (BID) | OROMUCOSAL | Status: DC
  Administered 2016-02-03 – 2016-02-04 (×4): 7 mL via OROMUCOSAL

## 2016-02-02 MED ORDER — SODIUM CHLORIDE 0.9% FLUSH
3.0000 mL | Freq: Two times a day (BID) | INTRAVENOUS | Status: DC
  Administered 2016-02-02 – 2016-02-04 (×2): 3 mL via INTRAVENOUS

## 2016-02-02 MED ORDER — DIAZEPAM 5 MG PO TABS
5.0000 mg | ORAL_TABLET | Freq: Four times a day (QID) | ORAL | Status: DC | PRN
  Administered 2016-02-05: 12:00:00 5 mg via ORAL
  Filled 2016-02-02: qty 1

## 2016-02-02 MED ORDER — SODIUM CHLORIDE 0.9% FLUSH: 3.0000 mL | INTRAVENOUS | Status: DC | PRN

## 2016-02-02 MED ORDER — SODIUM CHLORIDE 0.9 % IV SOLN: 250.0000 mL | INTRAVENOUS | Status: DC | PRN

## 2016-02-02 MED ORDER — ARIPIPRAZOLE 10 MG PO TABS
20.0000 mg | ORAL_TABLET | Freq: Every day | ORAL | Status: DC
  Administered 2016-02-02: 10 mg via ORAL
  Filled 2016-02-02 (×3): qty 2

## 2016-02-02 MED ORDER — INSULIN ASPART 100 UNIT/ML ~~LOC~~ SOLN
0.0000 [IU] | Freq: Three times a day (TID) | SUBCUTANEOUS | Status: DC
  Administered 2016-02-03: 3 [IU] via SUBCUTANEOUS
  Administered 2016-02-03: 2 [IU] via SUBCUTANEOUS
  Administered 2016-02-03: 3 [IU] via SUBCUTANEOUS
  Administered 2016-02-04: 2 [IU] via SUBCUTANEOUS
  Administered 2016-02-04: 3 [IU] via SUBCUTANEOUS
  Administered 2016-02-04: 5 [IU] via SUBCUTANEOUS
  Administered 2016-02-05: 3 [IU] via SUBCUTANEOUS
  Administered 2016-02-05: 18:00:00 8 [IU] via SUBCUTANEOUS
  Administered 2016-02-06: 3 [IU] via SUBCUTANEOUS

## 2016-02-02 MED ORDER — ASPIRIN 81 MG PO CHEW: 324.0000 mg | CHEWABLE_TABLET | ORAL | Status: DC

## 2016-02-02 MED ORDER — ASPIRIN 81 MG PO CHEW
CHEWABLE_TABLET | ORAL | Status: AC
  Filled 2016-02-02: qty 4

## 2016-02-02 MED ORDER — DILTIAZEM HCL ER BEADS 300 MG PO CP24
300.0000 mg | ORAL_CAPSULE | Freq: Every day | ORAL | Status: DC
  Administered 2016-02-03: 300 mg via ORAL
  Filled 2016-02-02: qty 1

## 2016-02-02 MED ORDER — ASPIRIN EC 81 MG PO TBEC
81.0000 mg | DELAYED_RELEASE_TABLET | Freq: Every day | ORAL | Status: DC
  Administered 2016-02-04 – 2016-02-06 (×2): 81 mg via ORAL
  Filled 2016-02-02 (×2): qty 1

## 2016-02-02 MED ORDER — SODIUM CHLORIDE 0.9 % WEIGHT BASED INFUSION: 1.0000 mL/kg/h | INTRAVENOUS | Status: DC

## 2016-02-02 MED ORDER — PANTOPRAZOLE SODIUM 40 MG PO TBEC
40.0000 mg | DELAYED_RELEASE_TABLET | Freq: Every day | ORAL | Status: DC
  Administered 2016-02-02 – 2016-02-04 (×3): 40 mg via ORAL
  Filled 2016-02-02 (×3): qty 1

## 2016-02-02 MED ORDER — ASPIRIN 81 MG PO CHEW
324.0000 mg | CHEWABLE_TABLET | ORAL | Status: AC
  Administered 2016-02-05: 324 mg via ORAL
  Filled 2016-02-02: qty 4

## 2016-02-02 MED ORDER — MORPHINE SULFATE (PF) 2 MG/ML IV SOLN
2.0000 mg | INTRAVENOUS | Status: DC | PRN
  Administered 2016-02-02: 2 mg via INTRAVENOUS
  Filled 2016-02-02: qty 1

## 2016-02-02 MED ORDER — ASPIRIN EC 81 MG PO TBEC: 81.0000 mg | DELAYED_RELEASE_TABLET | Freq: Every day | ORAL | Status: DC

## 2016-02-02 MED ORDER — ONDANSETRON HCL 4 MG/2ML IJ SOLN: 4.0000 mg | Freq: Four times a day (QID) | INTRAMUSCULAR | Status: DC | PRN

## 2016-02-02 MED ORDER — HEPARIN BOLUS VIA INFUSION
2000.0000 [IU] | Freq: Once | INTRAVENOUS | Status: AC
  Administered 2016-02-02: 2000 [IU] via INTRAVENOUS
  Filled 2016-02-02: qty 2000

## 2016-02-02 MED ORDER — NITROGLYCERIN IN D5W 200-5 MCG/ML-% IV SOLN
0.0000 ug/min | INTRAVENOUS | Status: DC
  Administered 2016-02-02: 20 ug/min via INTRAVENOUS
  Filled 2016-02-02: qty 250

## 2016-02-02 MED ORDER — SODIUM CHLORIDE 0.9 % IV SOLN
INTRAVENOUS | Status: AC
  Administered 2016-02-02: 75 mL/h via INTRAVENOUS

## 2016-02-02 MED ORDER — HYDRALAZINE HCL 25 MG PO TABS
25.0000 mg | ORAL_TABLET | Freq: Two times a day (BID) | ORAL | Status: DC
  Administered 2016-02-02 – 2016-02-04 (×5): 25 mg via ORAL
  Filled 2016-02-02 (×5): qty 1

## 2016-02-02 MED ORDER — ZOLPIDEM TARTRATE 5 MG PO TABS: 5.0000 mg | ORAL_TABLET | Freq: Every evening | ORAL | Status: DC | PRN

## 2016-02-02 MED ORDER — HEPARIN (PORCINE) IN NACL 100-0.45 UNIT/ML-% IJ SOLN
1350.0000 [IU]/h | INTRAMUSCULAR | Status: DC
  Administered 2016-02-02: 950 [IU]/h via INTRAVENOUS
  Administered 2016-02-03 – 2016-02-05 (×3): 1350 [IU]/h via INTRAVENOUS
  Filled 2016-02-02 (×5): qty 250

## 2016-02-02 MED ORDER — VORTIOXETINE HBR 20 MG PO TABS
20.0000 mg | ORAL_TABLET | Freq: Every day | ORAL | Status: DC
  Administered 2016-02-03 – 2016-02-06 (×4): 20 mg via ORAL
  Filled 2016-02-02 (×6): qty 20

## 2016-02-02 MED ORDER — NITROGLYCERIN 0.4 MG SL SUBL
0.4000 mg | SUBLINGUAL_TABLET | SUBLINGUAL | Status: DC | PRN
  Administered 2016-02-02 (×2): 0.4 mg via SUBLINGUAL
  Filled 2016-02-02: qty 1

## 2016-02-02 MED ORDER — LORATADINE 10 MG PO TABS
5.0000 mg | ORAL_TABLET | Freq: Every evening | ORAL | Status: DC
  Filled 2016-02-02 (×2): qty 1

## 2016-02-02 MED ORDER — DULOXETINE HCL 60 MG PO CPEP
60.0000 mg | ORAL_CAPSULE | Freq: Two times a day (BID) | ORAL | Status: DC
  Filled 2016-02-02 (×8): qty 1

## 2016-02-02 MED ORDER — HEPARIN BOLUS VIA INFUSION
4000.0000 [IU] | Freq: Once | INTRAVENOUS | Status: AC
  Administered 2016-02-02: 4000 [IU] via INTRAVENOUS

## 2016-02-02 MED ORDER — NITROGLYCERIN 0.4 MG SL SUBL: 0.4000 mg | SUBLINGUAL_TABLET | SUBLINGUAL | Status: DC | PRN

## 2016-02-02 MED ORDER — FLUTICASONE PROPIONATE 50 MCG/ACT NA SUSP
2.0000 | Freq: Every day | NASAL | Status: DC
  Filled 2016-02-02 (×2): qty 16

## 2016-02-02 MED ORDER — ALPRAZOLAM 0.25 MG PO TABS
0.2500 mg | ORAL_TABLET | Freq: Two times a day (BID) | ORAL | Status: DC | PRN
  Administered 2016-02-03 – 2016-02-05 (×2): 0.25 mg via ORAL
  Filled 2016-02-02: qty 1

## 2016-02-02 MED ORDER — ASPIRIN 81 MG PO CHEW
324.0000 mg | CHEWABLE_TABLET | Freq: Once | ORAL | Status: AC
  Administered 2016-02-02: 324 mg via ORAL

## 2016-02-02 MED ORDER — LORAZEPAM 1 MG PO TABS: 1.0000 mg | ORAL_TABLET | ORAL | Status: DC | PRN

## 2016-02-02 MED ORDER — SODIUM CHLORIDE 0.9 % WEIGHT BASED INFUSION
3.0000 mL/kg/h | INTRAVENOUS | Status: DC
  Administered 2016-02-02 – 2016-02-05 (×2): 3 mL/kg/h via INTRAVENOUS

## 2016-02-02 MED ORDER — ALBUTEROL SULFATE (2.5 MG/3ML) 0.083% IN NEBU: 3.0000 mL | INHALATION_SOLUTION | Freq: Four times a day (QID) | RESPIRATORY_TRACT | Status: DC | PRN

## 2016-02-02 MED ORDER — DEXTROSE-NACL 5-0.45 % IV SOLN
INTRAVENOUS | Status: DC
  Administered 2016-02-02: 12:00:00 via INTRAVENOUS

## 2016-02-02 MED ORDER — LITHIUM CARBONATE 150 MG PO CAPS
150.0000 mg | ORAL_CAPSULE | Freq: Three times a day (TID) | ORAL | Status: DC
  Filled 2016-02-02 (×4): qty 1

## 2016-02-02 MED ORDER — INSULIN GLARGINE 100 UNIT/ML ~~LOC~~ SOLN
50.0000 [IU] | Freq: Every day | SUBCUTANEOUS | Status: DC
  Administered 2016-02-02 – 2016-02-04 (×3): 50 [IU] via SUBCUTANEOUS
  Filled 2016-02-02 (×5): qty 0.5

## 2016-02-02 MED ORDER — ACETAMINOPHEN 325 MG PO TABS
650.0000 mg | ORAL_TABLET | ORAL | Status: DC | PRN
  Administered 2016-02-02 – 2016-02-04 (×4): 650 mg via ORAL
  Filled 2016-02-02 (×4): qty 2

## 2016-02-02 MED ORDER — NITROGLYCERIN IN D5W 200-5 MCG/ML-% IV SOLN
5.0000 ug/min | Freq: Once | INTRAVENOUS | Status: AC
  Administered 2016-02-02: 5 ug/min via INTRAVENOUS
  Filled 2016-02-02: qty 250

## 2016-02-02 MED ORDER — SODIUM CHLORIDE 0.9% FLUSH
3.0000 mL | Freq: Two times a day (BID) | INTRAVENOUS | Status: DC
  Administered 2016-02-02: 3 mL via INTRAVENOUS

## 2016-02-02 NOTE — ED Notes (Signed)
MD at the bedside with US

## 2016-02-02 NOTE — ED Provider Notes (Addendum)
CSN: [id number]     Arrival date & time 02/02/16  0414 History   First MD Initiated Contact with Patient 02/02/16 0430     Chief Complaint  Patient presents with  . Chest Pain     (Consider location/radiation/quality/duration/timing/severity/associated sxs/prior Treatment) HPI Comments: Patient presents to the emergency department for evaluation of chest pain. Patient reports that she developed substernal chest pressure and tightness associated with shortness of breath approximately 2 hours before coming to the ER. Patient denies nausea and diaphoresis. She does have history of coronary artery disease and diabetes, cardiologist is Dr. Jens Som. Patient has not taken any nitroglycerin or aspirin prior to arrival in the ER. She has not noticed any alleviating or exacerbating factors.  Patient is a 65 y.o. female presenting with chest pain.  Chest Pain Associated symptoms: shortness of breath     Past Medical History  Diagnosis Date  . HTN (hypertension)   . Hyperlipidemia   . GERD (gastroesophageal reflux disease)   . Obese   . Diabetes mellitus   . Hypothyroid   . Asthma   . Renal insufficiency   . Cardiac enlargement   . Aortic stenosis, mild     per echo Jan 2013; EF is 61%  . Carotid stenosis, bilateral     per doppler Jan 2013  . Abnormal nuclear stress test Jan 2013  . CAD (coronary artery disease) Jan 2013    with cardiac cath. Managed medically. No obstructive disease in the large caliber vessels, disease in the small OM2 and distal LAD; not amenable to PCI or grafting.    Past Surgical History  Procedure Laterality Date  . Abdominal hysterectomy    . Tonsillectomy    . Knee surgery    . Appendectomy    . Cardiac catheterization  Jan 2013    No major obstructive disease in the large caliber vessels, with disease in a small OM2 and distal LAD; not amenable to PCI or grafting.   . Knee arthroscopy  06/24/2012    Procedure: ARTHROSCOPY KNEE;  Surgeon: Nestor Lewandowsky, MD;   Location: [facility];  Service: Orthopedics;  Laterality: Right;  DEBRIDEMENT OF CHONDROMALACIA   Family History  Problem Relation Age of Onset  . Coronary artery disease Brother     CABG 80  . Heart disease Brother   . Coronary artery disease Brother     CABG 15   Social History  Substance Use Topics  . Smoking status: Never Smoker   . Smokeless tobacco: Never Used  . Alcohol Use: No   OB History    No data available     Review of Systems  Respiratory: Positive for shortness of breath.   Cardiovascular: Positive for chest pain.  All other systems reviewed and are negative.     Allergies  Amoxicillin-pot clavulanate; Codeine; and Penicillins  Home Medications   Prior to Admission medications   Medication Sig Start Date End Date Taking? Authorizing Provider  ACCU-CHEK AVIVA PLUS test strip CHECK BLOOD SUGAR 4 TIMES DAILY. 09/21/15  Yes Roma Kayser, MD  ARIPiprazole (ABILIFY) 20 MG tablet Take 20 mg by mouth daily.     Yes Historical Provider, MD  aspirin EC 81 MG tablet Take 1 tablet (81 mg total) by mouth daily. 10/29/11  Yes Lewayne Bunting, MD  diazepam (VALIUM) 5 MG tablet Take 5 mg by mouth every 6 (six) hours as needed for anxiety.   Yes Historical Provider, MD  HUMALOG KWIKPEN 100 UNIT/ML  SOPN INJECT 20 UNITS S.Q. THREE TIMES A DAY WITH MEALS. 10/06/13  Yes Romero BellingSean Ellison, MD  hydrALAZINE (APRESOLINE) 50 MG tablet Take 50 mg by mouth 2 (two) times daily.   Yes Nurse Nephrology, RN  Insulin Glargine (LANTUS SOLOSTAR) 100 UNIT/ML Solostar Pen INJECT 180 UNITS DAILY AS DIRECTED. Patient taking differently: Inject 100 Units into the skin at bedtime. INJECT 180 UNITS DAILY AS DIRECTED. 01/03/14  Yes Romero BellingSean Ellison, MD  levocetirizine (XYZAL) 5 MG tablet Take 1 tablet (5 mg total) by mouth every evening. 02/18/13  Yes Sherren MochaEva N Shaw, MD  Liraglutide (VICTOZA) 18 MG/3ML SOPN Inject 1.8 Units into the skin.   Yes Historical Provider, MD  LORazepam (ATIVAN) 1 MG  tablet Take 1 mg by mouth as needed.     Yes Historical Provider, MD  nitroGLYCERIN (NITROSTAT) 0.4 MG SL tablet Place 1 tablet (0.4 mg total) under the tongue every 5 (five) minutes as needed for chest pain. 12/27/11 02/02/16 Yes Rosalio MacadamiaLori C Gerhardt, NP  omeprazole (PRILOSEC) 20 MG capsule Take 1 capsule (20 mg total) by mouth 2 (two) times daily. 11/16/12  Yes Jessica C Copland, MD  TAZTIA XT 360 MG 24 hr capsule TAKE (1) CAPSULE BY MOUTH ONCE DAILY. 02/22/13  Yes Sherren MochaEva N Shaw, MD  ULTICARE SHORT PEN NEEDLES 31G X 8 MM MISC USE AS DIRECTED 08/15/14  Yes Romero BellingSean Ellison, MD  ULTILET CLASSIC LANCETS MISC Test blood sugar 3 times daily. PATIENT NEEDS OFFICE VISIT FOR ADDITIONAL REFILLS 02/14/14  Yes Chelle Jeffery, PA-C  Vitamin D, Ergocalciferol, (DRISDOL) 50000 units CAPS capsule Take 1 capsule (50,000 Units total) by mouth every 7 (seven) days. 11/30/15  Yes Roma KayserGebreselassie W Nida, MD  Vortioxetine HBr (TRINTELLIX) 20 MG TABS Take by mouth.   Yes Historical Provider, MD  ZOSTAVAX [id number] UNT/0.65ML injection  03/16/13  Yes Historical Provider, MD  albuterol (PROVENTIL HFA;VENTOLIN HFA) 108 (90 BASE) MCG/ACT inhaler Inhale 2 puffs into the lungs every 6 (six) hours as needed. 11/16/12   Gwenlyn FoundJessica C Copland, MD  azelastine (ASTELIN) 137 MCG/SPRAY nasal spray Place 1 spray into the nose 2 (two) times daily. Patient not taking: Reported on 10/12/2014 02/18/13   Sherren MochaEva N Shaw, MD  benzonatate (TESSALON) 100 MG capsule Take 1 capsule (100 mg total) by mouth 3 (three) times daily as needed for cough. Patient not taking: Reported on 10/12/2014 03/15/13   Pearline CablesJessica C Copland, MD  DULoxetine (CYMBALTA) 60 MG capsule Take 60 mg by mouth 2 (two) times daily.     Historical Provider, MD  fluticasone (FLONASE) 50 MCG/ACT nasal spray Place 2 sprays into the nose at bedtime. 02/18/13   Sherren MochaEva N Shaw, MD  Liraglutide (VICTOZA) 18 MG/3ML SOPN Inject 1.8 mg into the skin daily.    Historical Provider, MD  lithium carbonate 150 MG capsule Take 150 mg by  mouth 3 (three) times daily with meals.    Historical Provider, MD  rosuvastatin (CRESTOR) 40 MG tablet Take 1 tablet (40 mg total) by mouth daily. 02/18/13 02/18/14  Sherren MochaEva N Shaw, MD   BP 126/53 mmHg  Pulse 86  Resp 20  SpO2 95% Physical Exam  Constitutional: She is oriented to person, place, and time. She appears well-developed and well-nourished. No distress.  HENT:  Head: Normocephalic and atraumatic.  Right Ear: Hearing normal.  Left Ear: Hearing normal.  Nose: Nose normal.  Mouth/Throat: Oropharynx is clear and moist and mucous membranes are normal.  Eyes: Conjunctivae and EOM are normal. Pupils are equal, round, and reactive to  light.  Neck: Normal range of motion. Neck supple.  Cardiovascular: Regular rhythm, S1 normal and S2 normal.  Exam reveals no gallop and no friction rub.   No murmur heard. Pulmonary/Chest: Effort normal and breath sounds normal. No respiratory distress. She exhibits no tenderness.  Abdominal: Soft. Normal appearance and bowel sounds are normal. There is no hepatosplenomegaly. There is no tenderness. There is no rebound, no guarding, no tenderness at McBurney's point and negative Murphy's sign. No hernia.  Musculoskeletal: Normal range of motion.  Neurological: She is alert and oriented to person, place, and time. She has normal strength. No cranial nerve deficit or sensory deficit. Coordination normal. GCS eye subscore is 4. GCS verbal subscore is 5. GCS motor subscore is 6.  Skin: Skin is warm, dry and intact. No rash noted. No cyanosis.  Psychiatric: She has a normal mood and affect. Her speech is normal and behavior is normal. Thought content normal.  Nursing note and vitals reviewed.   ED Course  Procedures (including critical care time) Labs Review Labs Reviewed  CBC WITH DIFFERENTIAL/PLATELET - Abnormal; Notable for the following:    WBC 14.2 (*)    RDW 15.6 (*)    Neutro Abs 10.3 (*)    All other components within normal limits  BASIC METABOLIC  PANEL - Abnormal; Notable for the following:    Glucose, Bld 160 (*)    Creatinine, Ser 1.85 (*)    GFR calc non Af Amer 28 (*)    GFR calc Af Amer 32 (*)    All other components within normal limits  TROPONIN I - Abnormal; Notable for the following:    Troponin I 4.42 (*)    All other components within normal limits  BRAIN NATRIURETIC PEPTIDE    Imaging Review Dg Chest Port 1 View  02/02/2016  CLINICAL DATA:  Chest pain EXAM: PORTABLE CHEST 1 VIEW COMPARISON:  01/28/2012 FINDINGS: Cardiopericardial enlargement which is increased from prior. There is no edema, consolidation, effusion, or pneumothorax. No acute osseous finding. IMPRESSION: 1. Cardiopericardial enlargement which has increased from 2013. Pericardial effusion is possible. 2. No edema or pneumonia. Electronically Signed   By: Marnee Spring M.D.   On: 02/02/2016 04:44   I have personally reviewed and evaluated these images and lab results as part of my medical decision-making.   EKG #1 Interpretation   Date/Time:  Friday February 02 2016 04:22:11 EDT Ventricular Rate:  94 PR Interval:  229 QRS Duration: 103 QT Interval:  353 QTC Calculation: 441 R Axis:   58 Text Interpretation:  Sinus rhythm Prolonged PR interval Low voltage,  extremity leads Repol abnrm suggests ischemia, anterolateral Minimal ST  elevation, inferior leads Baseline wander in lead(s) V2 Confirmed by  POLLINA  MD, CHRISTOPHER [phone]) on 02/02/2016 5:01:32 AM   EKG #2 Interpretation (after Nitro)  Date/Time:  Friday February 02 2016 04:22:11 EDT Ventricular Rate:  94 PR Interval:  229 QRS Duration: 103 QT Interval:  353 QTC Calculation: 441 R Axis:   58 Text Interpretation:  Sinus rhythm Prolonged PR interval Low voltage, extremity leads Repol abnrm suggests ischemia, anterolateral Minimal ST elevation, inferior leads Baseline wander in lead(s) V2 Confirmed by POLLINA  MD, CHRISTOPHER ([id number]) on 02/02/2016 5:01:32 AM            MDM   Final  diagnoses:  NSTEMI (non-ST elevated myocardial infarction) [facility])    Patient with previous history of coronary artery disease and MI presents to the ER with 2 hours of chest pain.  Patient reports substernal chest discomfort associated with shortness of breath. Reviewing her records reveals that her previous MI culprit was felt to be either OM2 or distal LAD, but neither were amenable to PCI. 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 Patient has not seen her cardiologist for some time, however.  Initial EKG had nonspecific repolarization abnormality, although there was subtle elevations in the inferior leads and depressions in the lateral leads, there was not enough elevation to be diagnostic for STEMI. She was given aspirin and nitroglycerin and her pain significantly improved, has only a very slight unquantifiable residual discomfort. Repeat EKG at this point does not reveal any elevations or depressions.  Chest x-ray shows enlarged heart. She has not had a chest x-ray or echocardiogram since 2013. I did quickly visualize her heart with ultrasound and did not see any large fluid collections. She does not exhibit a benign physiology. It is therefore felt that the enlarged cardiac silhouette on x-ray is likely secondary to cardiac enlargement, not effusion. It was therefore felt that the patient would benefit from heparin.  Patient initiated on IV nitroglycerin and IV heparin. Discussed with Dr. Onalee Hua, on for cardiology. Patient except for transfer to [facility].  CRITICAL CARE Performed by: Gilda Crease   Total critical care time: 30 minutes  Critical care time was exclusive of separately billable procedures and treating other patients.  Critical care was necessary to treat or prevent imminent or life-threatening deterioration.  Critical care was time spent personally by me on the following activities: development of treatment plan with patient and/or surrogate as well as nursing,  discussions with consultants, evaluation of patient's response to treatment, examination of patient, obtaining history from patient or surrogate, ordering and performing treatments and interventions, ordering and review of laboratory studies, ordering and review of radiographic studies, pulse oximetry and re-evaluation of patient's condition.     Gilda Crease, MD 02/02/16 0543  Gilda Crease, MD 02/02/16 [phone]

## 2016-02-02 NOTE — ED Notes (Signed)
Paula with carelink given report.

## 2016-02-02 NOTE — ED Notes (Signed)
CRITICAL VALUE ALERT  Critical value received:  troponin 4.42  Date of notification:  02/02/16  Time of notification:  0526  Critical value read back:Yes.    Nurse who received alert:  Rcockram  MD notified (1st page):  Dr Oletta CohnPolina

## 2016-02-02 NOTE — H&P (Signed)
CARDIOLOGY HISTORY AND PHYSICAL   Patient ID: Teresa Hart MRN: [id number] DOB/AGE: 07/24/1951 65 y.o.  Admit date: 02/02/2016  Primary Physician   Teresa Richard, PA-C Primary Cardiologist   Dr Teresa Hart Reason for Consultation   Chest pain, NSTEMI  WUJ:WJXBJ Teresa Hart is a 65 y.o. year old female with a history of mild AS, DM, HTN, carotid dz with 60-79% RICA, 40-59% LICA 04/2012, CKD III, MV 11/2011 was abnl, cath w/ small vessel dz, med rx.  Pt was in her USOH yesterday, stayed up late to watch [employer] bball games. About 12:30, pt tried to go to bed, but had chest tightness. Mild at first, increased to 7/10. No associated symptoms, no radiation. Did not try any medications.    When symptoms did not resolve, pt came to ER. Her ECG was not acute, but initial troponin was elevated. She was given ASA, SL NTG x 2, started on heparin and IV NTG and symptoms improved.  Currently pain is a 1/10. She has never had pain like this before.  [facility] here to get pt, she is still having 1/10 chest pain, IV NTG currently at 20 mcg/min, hydration at 300 cc/hr and IV Heparin running as well. VSS.   Past Medical History  Diagnosis Date  . HTN (hypertension)   . Hyperlipidemia   . GERD (gastroesophageal reflux disease)   . Obese   . Diabetes mellitus   . Hypothyroid   . Asthma   . Renal insufficiency   . Cardiac enlargement   . Aortic stenosis, mild     per echo Jan 2013; EF is 61%  . Carotid stenosis, bilateral     per doppler Jan 2013; 60-79% RICA, 40-59% LICA  . Abnormal nuclear stress test Jan 2013  . CAD (coronary artery disease) Jan 2013    Cath w/ No obstructive disease in the large caliber vessels, disease in the small OM2 and distal LAD; not amenable to PCI or grafting. Managed medically.    . CKD (chronic kidney disease), stage III      Past Surgical History  Procedure Laterality Date  . Abdominal hysterectomy    . Tonsillectomy    . Knee surgery    . Appendectomy     . Cardiac catheterization  Jan 2013    No major obstructive disease in the large caliber vessels, with disease in a small OM2 and distal LAD; not amenable to PCI or grafting.   . Knee arthroscopy  06/24/2012    Procedure: ARTHROSCOPY KNEE;  Surgeon: Teresa Lewandowsky, MD;  Location: [facility];  Service: Orthopedics;  Laterality: Right;  DEBRIDEMENT OF CHONDROMALACIA    Allergies  Allergen Reactions  . Amoxicillin-Pot Clavulanate Nausea Only  . Crestor [Rosuvastatin]     Muscle spasms and cramps  . Codeine     REACTION: nausea  . Penicillins     REACTION: major hives,swelling    I have reviewed the patient's current medications . aspirin       . sodium chloride 75 mL/hr (02/02/16 4782)  . sodium chloride    . [START ON 02/03/2016] sodium chloride 3 mL/kg/hr (02/02/16 0949)   Followed by  . [START ON 02/03/2016] sodium chloride    . heparin 950 Units/hr (02/02/16 0611)   sodium chloride, morphine injection, nitroGLYCERIN  Medication Sig  ACCU-CHEK AVIVA PLUS test strip CHECK BLOOD SUGAR 4 TIMES DAILY.  ARIPiprazole (ABILIFY) 20 MG tablet Take 20 mg by mouth daily.  aspirin EC 81 MG tablet Take 1 tablet (81 mg total) by mouth daily.  diazepam (VALIUM) 5 MG tablet Take 5 mg by mouth every 6 (six) hours as needed for anxiety.  HUMALOG KWIKPEN 100 UNIT/ML SOPN INJECT 20 UNITS S.Q. THREE TIMES A DAY WITH MEALS.  hydrALAZINE (APRESOLINE) 50 MG tablet Take 50 mg by mouth 2 (two) times daily.  Insulin Glargine (LANTUS SOLOSTAR) 100 UNIT/ML Solostar Pen INJECT 180 UNITS DAILY AS DIRECTED. Patient taking differently: Inject 100 Units into the skin at bedtime. INJECT 180 UNITS DAILY AS DIRECTED.  levocetirizine (XYZAL) 5 MG tablet Take 1 tablet (5 mg total) by mouth every evening.  Liraglutide (VICTOZA) 18 MG/3ML SOPN Inject 1.8 Units into the skin.  LORazepam (ATIVAN) 1 MG tablet Take 1 mg by mouth as needed.    nitroGLYCERIN (NITROSTAT) 0.4 MG SL tablet Place 1 tablet (0.4  mg total) under the tongue every 5 (five) minutes as needed for chest pain.  omeprazole (PRILOSEC) 20 MG capsule Take 1 capsule (20 mg total) by mouth 2 (two) times daily.  TAZTIA XT 360 MG 24 hr capsule TAKE (1) CAPSULE BY MOUTH ONCE DAILY.  ULTICARE SHORT PEN NEEDLES 31G X 8 MM MISC USE AS DIRECTED  ULTILET CLASSIC LANCETS MISC Test blood sugar 3 times daily. PATIENT NEEDS OFFICE VISIT FOR ADDITIONAL REFILLS  Vitamin D, Ergocalciferol, (DRISDOL) 50000 units CAPS capsule Take 1 capsule (50,000 Units total) by mouth every 7 (seven) days.  Vortioxetine HBr (TRINTELLIX) 20 MG TABS Take by mouth.  ZOSTAVAX [id number] UNT/0.65ML injection   albuterol (PROVENTIL HFA;VENTOLIN HFA) 108 (90 BASE) MCG/ACT inhaler Inhale 2 puffs into the lungs every 6 (six) hours as needed.  azelastine (ASTELIN) 137 MCG/SPRAY nasal spray Place 1 spray into the nose 2 (two) times daily. Patient not taking: Reported on 10/12/2014  benzonatate (TESSALON) 100 MG capsule Take 1 capsule (100 mg total) by mouth 3 (three) times daily as needed for cough. Patient not taking: Reported on 10/12/2014  DULoxetine (CYMBALTA) 60 MG capsule Take 60 mg by mouth 2 (two) times daily.   fluticasone (FLONASE) 50 MCG/ACT nasal spray Place 2 sprays into the nose at bedtime.  Liraglutide (VICTOZA) 18 MG/3ML SOPN Inject 1.8 mg into the skin daily.  lithium carbonate 150 MG capsule Take 150 mg by mouth 3 (three) times daily with meals.  rosuvastatin (CRESTOR) 40 MG tablet Take 1 tablet (40 mg total) by mouth daily.     Social History   Social History  . Marital Status: Married    Spouse Name: N/A  . Number of Children: 0  . Years of Education: N/A   Occupational History  . Disabled    Social History Main Topics  . Smoking status: Never Smoker   . Smokeless tobacco: Never Used  . Alcohol Use: No  . Drug Use: No  . Sexual Activity: Yes   Other Topics Concern  . Not on file   Social History Narrative   Lives with husband, disabled  from multiple medical issues.    Family Status  Relation Status Death Age  . Brother Alive   . Brother Alive   . Mother Deceased   . Father Deceased   . Sister Deceased   . Brother Alive    Family History  Problem Relation Age of Onset  . Coronary artery disease Brother     CABG 3356  . Heart disease Brother   . Coronary artery disease Brother     CABG 7158  ROS:  Full 14 point review of systems complete and found to be negative unless listed above.  Physical Exam: Blood pressure 133/69, pulse 91, temperature 99 F (37.2 C), resp. rate 15, height  (1.651 m), weight 216 lb (97.977 kg), SpO2 94 %.  General: Well developed, well nourished, female in no acute distress Head: Eyes PERRLA, No xanthomas.   Normocephalic and atraumatic, oropharynx without edema or exudate. Dentition: fair Lungs: clear bilaterally Heart: HRRR S1 S2, no rub/gallop, 2/6 murmur. pulses are 2+ all 4 extrem.   Neck: No carotid bruits. No lymphadenopathy.  JVD not seen elevated but difficult to assess 2nd body habitus. Abdomen: Bowel sounds present, abdomen soft and non-tender without masses or hernias noted. Msk:  No spine or cva tenderness. No weakness, no joint deformities or effusions. Extremities: No clubbing or cyanosis. no edema.  Neuro: Alert and oriented X 3. No focal deficits noted. Psych:  Good affect, responds appropriately Skin: No rashes or lesions noted.  Labs:   Lab Results  Component Value Date   WBC 14.2* 02/02/2016   HGB 12.2 02/02/2016   HCT 38.0 02/02/2016   MCV 92.5 02/02/2016   PLT 322 02/02/2016    Recent Labs  02/02/16 0420  INR 0.95     Recent Labs Lab 02/02/16 0420  NA 140  K 4.2  CL 102  CO2 26  BUN 14  CREATININE 1.85*  CALCIUM 9.6  GLUCOSE 160*    Recent Labs  02/02/16 0420  TROPONINI 4.42*   B NATRIURETIC PEPTIDE  Date/Time Value Ref Range Status  02/02/2016 04:20 AM 63.0 0.0 - 100.0 pg/mL Final   Lab Results  Component Value Date   CHOL  323* 02/18/2013   HDL 55 02/18/2013   LDLCALC 206* 02/18/2013   TRIG 310* 02/18/2013    Echo: 01/26/2012 - Left ventricle: The cavity size was normal. Wall thickness was increased in a pattern of mild LVH. Systolic function was normal. The estimated ejection fraction was in the range of 55% to 60%. Wall motion was normal; there were no regional wall motion abnormalities. Doppler parameters are consistent with abnormal left ventricular relaxation (grade 1 diastolic dysfunction). - Aortic valve: Poorly visualized. Probably trileaflet; mildly calcified leaflets. There was mild stenosis. Mean gradient: 11mm Hg (S). Peak gradient: 20mm Hg (S). - Mitral valve: No significant regurgitation. - Left atrium: The atrium was mildly dilated. - Right ventricle: The cavity size was normal. Systolic function was normal. - Pulmonic valve: Very mildly elevated gradient across pulmonary valve, peak gradient 19 mmHg. - Pulmonary arteries: No complete TR doppler jet so unable to estimate PA systolic pressure. - Inferior vena cava: The vessel was normal in size; the respirophasic diameter changes were in the normal range (= 50%); findings are consistent with normal central venous pressure. Impressions: - Normal LV size with mild LV hypertrophy. EF 55-60%. Mild aortic stenosis. Normal RV size and systolic function. Mildly elevated peak gradient across the pulmonic valve.  ECG:  02/02/2016 SR, no ST elevation or depression, no Q waves  Cardiac Cath: 11/2011 Left main: There are different ostia for the Circumflex and LAD Left Anterior Descending Artery: Moderate sized vessel that courses to the apex. There is mild 20% plaque throughout the proximal and mid vessel. The distal vessel becomes very small in caliber (1.0-1.5 mm) and has a 70% stenosis. There are two small caliber diagonal branches without disease.  Circumflex Artery: Moderate sized vessel that gives off two  obtuse marginal branches. The first OM branch  is very small and has no disease. The second OM branch is a small caliber vessel (1.5 mm) and has diffuse 90% stenosis from the ostium down through the mid segment of the vessel. This branch vessel is too small for consideration for PCI or grafting.  Right Coronary Artery: Large, dominant vessel. There is mild plaque throughout the vessel. The proximal and distal segments have diffuse 20% stenosis. The mid segment has 40% stenosis.  Left Ventricular Angiogram: Not performed secondary to renal insufficiency Impression: 1. No major obstructive disease in the large caliber vessels.  2. There is disease in the small caliber OM2 branch and the small caliber distal LAD. Both vessels are too small for PCI or grafting.  3. No assessment of LV function today Recommendations: I think medical management of her CAD is the best approach.  Radiology:  Dg Chest Port 1 View 02/02/2016  CLINICAL DATA:  Chest pain EXAM: PORTABLE CHEST 1 VIEW COMPARISON:  01/28/2012 FINDINGS: Cardiopericardial enlargement which is increased from prior. There is no edema, consolidation, effusion, or pneumothorax. No acute osseous finding. IMPRESSION: 1. Cardiopericardial enlargement which has increased from 2013. Pericardial effusion is possible. 2. No edema or pneumonia. Electronically Signed   By: Marnee Spring M.D.   On: 02/02/2016 04:44    ASSESSMENT AND PLAN:   The patient was seen today by Dr Allyson Sabal, the patient evaluated and the data reviewed.  Principal Problem:   NSTEMI (non-ST elevated myocardial infarction) (HCC) - on ASA, NTG, heparin. Morphine PRN - for CCU admission - cath when medically stable>>Cr is a concern.  - add statin>> low dose 2nd hx intolerance - add low-dose Coreg  Active Problems:   Hypertension - good control now, hold hydralazine with IV NTG    Hyperlipidemia - pt remembers not tolerating Crestor, says other statins were tried but not able to  remember which; no info in notes in Epic - will start Pravachol 20 mg qod and see how tolerated    Aortic stenosis, mild - also pt w/ possible cardiac enlargement on CXR>>stat echo    Hypothyroid - ck TSH and continue home rx    Diabetes mellitus with stage 3 chronic kidney disease (HCC) - pt took Lantus 100 U and Victoza 1.8 mg last pm - CBG 154 at transfer - continue to follow CBGs closely, will have to change to D5-1/2 NS at some point.    SignedLeanna Battles 02/02/2016 9:58 AM Alvino Chapel [phone]  Co-Sign MD  Agree with note by Theodore Demark PA-C  Pt with known non crit CAD by cath 1/13 ( Dr CM). Other probs as outlined including Stage 3 CKD. Pt developed SSCP 12:30 this AM after BB game. Went to [employer]. CP decreased after SL NTG. Currently pain free. EKG w/o acute changes. Trop elevated at 4.4 and SCr 1.8. On IV Hep/NTG. Exam benign. Will check 2D. Cont IVF/ NTG and Hep. Cycle enz. I will review old films. Plan cath Monday if she remains clinically stable.  Runell Gess, M.D., FACP, [facility], [facility] [address] [place], [place]  [address]  [phone] 02/02/2016 12:22 PM

## 2016-02-02 NOTE — Progress Notes (Signed)
ANTICOAGULATION CONSULT NOTE   Pharmacy Consult for heparin Indication: chest pain/ACS  Allergies  Allergen Reactions  . Amoxicillin-Pot Clavulanate Nausea Only  . Crestor [Rosuvastatin]     Muscle spasms and cramps  . Codeine     REACTION: nausea  . Penicillins     REACTION: major hives,swelling    Patient Measurements: Height: 5\' 5"  (165.1 cm) Weight: 216 lb 4.3 oz (98.1 kg) IBW/kg (Calculated) : 57 HEPARIN DW (KG): 79.3   Vital Signs: Temp: 99 F (37.2 C) (03/17 1900) Temp Source: Oral (03/17 1900) BP: 152/76 mmHg (03/17 1900) Pulse Rate: 95 (03/17 1900)  Labs:  Recent Labs  02/02/16 0420 02/02/16 1155 02/02/16 1230 02/02/16 1316 02/02/16 1948  HGB 12.2  --   --   --   --   HCT 38.0  --   --   --   --   PLT 322  --   --   --   --   APTT 27  --   --   --   --   LABPROT 12.9  --   --   --   --   INR 0.95  --   --   --   --   HEPARINUNFRC  --   --   --  0.26* 0.19*  CREATININE 1.85*  --   --   --   --   TROPONINI 4.42* 5.07* 5.11*  --   --    Estimated Creatinine Clearance: 36.1 mL/min (by C-G formula based on Cr of 1.85).  Medical History: Past Medical History  Diagnosis Date  . HTN (hypertension)   . Hyperlipidemia   . GERD (gastroesophageal reflux disease)   . Obese   . Diabetes mellitus   . Hypothyroid   . Asthma   . Renal insufficiency   . Cardiac enlargement   . Aortic stenosis, mild     per echo Jan 2013; EF is 61%  . Carotid stenosis, bilateral     per doppler Jan 2013; 60-79% RICA, 40-59% LICA  . Abnormal nuclear stress test Jan 2013  . CAD (coronary artery disease) Jan 2013    Cath w/ No obstructive disease in the large caliber vessels, disease in the small OM2 and distal LAD; not amenable to PCI or grafting. Managed medically.    . CKD (chronic kidney disease), stage III    Assessment: 65 yo female presented to ED with chest pain and SOB. Hx CAD, MI. No PCI, has been medically managed by cardiologist. EKG shows no elevations or  depressions. Started on IV NTG and IV heparin.   Repeat HL has trended down to 0.19 despite rate increase to 1100/hr. No bleeding issues noted per RN   Goal of Therapy:  Heparin level 0.3-0.7 units/ml   Plan:  Give Heparin 2000 units bolus  Increase heparin to 1350/hr Check 6 hour HL Cath Monday if indicated  Vinnie LevelBenjamin Val Farnam, PharmD., BCPS Clinical Pharmacist Pager [phone]

## 2016-02-02 NOTE — ED Notes (Signed)
Attempted report x1. 

## 2016-02-02 NOTE — ED Notes (Signed)
Pt reports 2 hour history of pain to center of chest.

## 2016-02-02 NOTE — Progress Notes (Signed)
ANTICOAGULATION CONSULT NOTE - Preliminary  Pharmacy Consult for heparin Indication: chest pain/ACS  Allergies  Allergen Reactions  . Amoxicillin-Pot Clavulanate Nausea Only  . Crestor [Rosuvastatin]     Muscle spasms and cramps  . Codeine     REACTION: nausea  . Penicillins     REACTION: major hives,swelling    Patient Measurements: Height: 5\' 5"  (165.1 cm) Weight: 216 lb 4.3 oz (98.1 kg) IBW/kg (Calculated) : 57 HEPARIN DW (KG): 79.3   Vital Signs: Temp: 98.7 F (37.1 C) (03/17 1110) Temp Source: Oral (03/17 1110) BP: 143/67 mmHg (03/17 1130) Pulse Rate: 84 (03/17 1130)  Labs:  Recent Labs  02/02/16 0420 02/02/16 1155 02/02/16 1230 02/02/16 1316  HGB 12.2  --   --   --   HCT 38.0  --   --   --   PLT 322  --   --   --   APTT 27  --   --   --   LABPROT 12.9  --   --   --   INR 0.95  --   --   --   HEPARINUNFRC  --   --   --  0.26*  CREATININE 1.85*  --   --   --   TROPONINI 4.42* 5.07* 5.11*  --    Estimated Creatinine Clearance: 36.1 mL/min (by C-G formula based on Cr of 1.85).  Medical History: Past Medical History  Diagnosis Date  . HTN (hypertension)   . Hyperlipidemia   . GERD (gastroesophageal reflux disease)   . Obese   . Diabetes mellitus   . Hypothyroid   . Asthma   . Renal insufficiency   . Cardiac enlargement   . Aortic stenosis, mild     per echo Jan 2013; EF is 61%  . Carotid stenosis, bilateral     per doppler Jan 2013; 60-79% RICA, 40-59% LICA  . Abnormal nuclear stress test Jan 2013  . CAD (coronary artery disease) Jan 2013    Cath w/ No obstructive disease in the large caliber vessels, disease in the small OM2 and distal LAD; not amenable to PCI or grafting. Managed medically.    . CKD (chronic kidney disease), stage III    Assessment: 65 yo female presented to ED with chest pain and SOB. Hx CAD, MI. No PCI, has been medically managed by cardiologist. EKG shows no elevations or depressions. Started on IV NTG and IV heparin.    Initial HL 0.26 on 950/hr. No bleeding issues noted  Goal of Therapy:  Heparin level 0.3-0.7 units/ml   Plan:  Increase heparin to 1100/hr Check 6 hour HL Cath Monday if indicated  Sheppard CoilFrank Wilson PharmD., BCPS Clinical Pharmacist Pager [phone] 02/02/2016 2:11 PM

## 2016-02-02 NOTE — Progress Notes (Signed)
CRITICAL VALUE ALERT  Critical value received:  Troponin 5.11  Date of notification:  02/02/2016  Time of notification:  1410  Critical value read back:Yes.    Nurse who received alert:  Larey SeatElla BethelRN  MD notified (1st page):  Azalee CourseHao Meng  Time of first page:  1415  MD notified (2nd page):  Time of second page:  Responding MD:  Azalee CourseHao Meng  Time MD responded:  [phone]

## 2016-02-02 NOTE — Progress Notes (Signed)
ANTICOAGULATION CONSULT NOTE - Preliminary  Pharmacy Consult for heparin Indication: chest pain/ACS  Allergies  Allergen Reactions  . Amoxicillin-Pot Clavulanate Nausea Only  . Codeine     REACTION: nausea  . Penicillins     REACTION: major hives,swelling    Patient Measurements: Height: 5\' 5"  (165.1 cm) Weight: 216 lb (97.977 kg) IBW/kg (Calculated) : 57 HEPARIN DW (KG): 79.3   Vital Signs: BP: 146/58 mmHg (03/17 0530) Pulse Rate: 87 (03/17 0530)  Labs:  Recent Labs  02/02/16 0420  HGB 12.2  HCT 38.0  PLT 322  CREATININE 1.85*  TROPONINI 4.42*   Estimated Creatinine Clearance: 36.1 mL/min (by C-G formula based on Cr of 1.85).  Medical History: Past Medical History  Diagnosis Date  . HTN (hypertension)   . Hyperlipidemia   . GERD (gastroesophageal reflux disease)   . Obese   . Diabetes mellitus   . Hypothyroid   . Asthma   . Renal insufficiency   . Cardiac enlargement   . Aortic stenosis, mild     per echo Jan 2013; EF is 61%  . Carotid stenosis, bilateral     per doppler Jan 2013  . Abnormal nuclear stress test Jan 2013  . CAD (coronary artery disease) Jan 2013    with cardiac cath. Managed medically. No obstructive disease in the large caliber vessels, disease in the small OM2 and distal LAD; not amenable to PCI or grafting.     Medications:  Scheduled:  . aspirin      . heparin  4,000 Units Intravenous Once    Assessment: 65 yo female presented to ED with chest pain and SOB. Hx CAD, MI. No PCI, has been medically managed by cardiologist. EKG shows no elevations or depressions. Started on IV NTG and IV heparin.   Goal of Therapy:  Heparin level 0.3-0.7 units/ml   Plan:  Give 4000 units bolus x 1 Start heparin infusion at 950 units/hr Check anti-Xa level in 6 hours and daily while on heparin Continue to monitor H&H and platelets Preliminary review of pertinent patient information completed.  Jeani HawkingAnnie Penn clinical pharmacist will complete  review during morning rounds to assess the patient and finalize treatment regimen.  Loyola MastMidkiff, Quentin Shorey Scarlett, RPH 02/02/2016,6:05 AM

## 2016-02-03 ENCOUNTER — Inpatient Hospital Stay (HOSPITAL_COMMUNITY): Payer: Medicare Other

## 2016-02-03 DIAGNOSIS — I2510 Atherosclerotic heart disease of native coronary artery without angina pectoris: Secondary | ICD-10-CM

## 2016-02-03 LAB — CBC
HCT: 33.2 % — ABNORMAL LOW (ref 36.0–46.0)
Hemoglobin: 10.3 g/dL — ABNORMAL LOW (ref 12.0–15.0)
MCH: 28.7 pg (ref 26.0–34.0)
MCHC: 31 g/dL (ref 30.0–36.0)
MCV: 92.5 fL (ref 78.0–100.0)
PLATELETS: 275 10*3/uL (ref 150–400)
RBC: 3.59 MIL/uL — ABNORMAL LOW (ref 3.87–5.11)
RDW: 15.4 % (ref 11.5–15.5)
WBC: 10.4 10*3/uL (ref 4.0–10.5)

## 2016-02-03 LAB — COMPREHENSIVE METABOLIC PANEL
ALBUMIN: 3.1 g/dL — AB (ref 3.5–5.0)
ALK PHOS: 88 U/L (ref 38–126)
ALT: 19 U/L (ref 14–54)
AST: 31 U/L (ref 15–41)
Anion gap: 10 (ref 5–15)
BILIRUBIN TOTAL: 0.4 mg/dL (ref 0.3–1.2)
BUN: 11 mg/dL (ref 6–20)
CALCIUM: 9.3 mg/dL (ref 8.9–10.3)
CHLORIDE: 105 mmol/L (ref 101–111)
CO2: 25 mmol/L (ref 22–32)
CREATININE: 1.51 mg/dL — AB (ref 0.44–1.00)
GFR, EST AFRICAN AMERICAN: 41 mL/min — AB (ref >60)
GFR, EST NON AFRICAN AMERICAN: 36 mL/min — AB (ref >60)
Glucose, Bld: 115 mg/dL — ABNORMAL HIGH (ref 65–99)
Potassium: 4.2 mmol/L (ref 3.5–5.1)
SODIUM: 140 mmol/L (ref 135–145)
Total Protein: 6.4 g/dL — ABNORMAL LOW (ref 6.5–8.1)

## 2016-02-03 LAB — GLUCOSE, CAPILLARY
GLUCOSE-CAPILLARY: 122 mg/dL — AB (ref 65–99)
GLUCOSE-CAPILLARY: 159 mg/dL — AB (ref 65–99)
GLUCOSE-CAPILLARY: 192 mg/dL — AB (ref 65–99)
Glucose-Capillary: 175 mg/dL — ABNORMAL HIGH (ref 65–99)

## 2016-02-03 LAB — HEMOGLOBIN A1C
Hgb A1c MFr Bld: 7.7 % — ABNORMAL HIGH (ref 4.8–5.6)
Mean Plasma Glucose: 174 mg/dL

## 2016-02-03 LAB — LIPID PANEL
Cholesterol: 332 mg/dL — ABNORMAL HIGH (ref 0–200)
HDL: 48 mg/dL (ref >40)
LDL CALC: 222 mg/dL — AB (ref 0–99)
TRIGLYCERIDES: 311 mg/dL — AB (ref <150)
Total CHOL/HDL Ratio: 6.9 RATIO
VLDL: 62 mg/dL — ABNORMAL HIGH (ref 0–40)

## 2016-02-03 LAB — ECHOCARDIOGRAM COMPLETE
HEIGHTINCHES: 65 in
WEIGHTICAEL: 3409.6 [oz_av]

## 2016-02-03 LAB — TROPONIN I: TROPONIN I: 9.51 ng/mL — AB (ref <0.031)

## 2016-02-03 LAB — HEPARIN LEVEL (UNFRACTIONATED)
HEPARIN UNFRACTIONATED: 0.61 [IU]/mL (ref 0.30–0.70)
HEPARIN UNFRACTIONATED: 0.41 [IU]/mL (ref 0.30–0.70)

## 2016-02-03 MED ORDER — LORAZEPAM 2 MG/ML IJ SOLN: 1.0000 mg | Freq: Four times a day (QID) | INTRAMUSCULAR | Status: DC | PRN

## 2016-02-03 MED ORDER — CARVEDILOL 12.5 MG PO TABS
12.5000 mg | ORAL_TABLET | Freq: Two times a day (BID) | ORAL | Status: DC
  Administered 2016-02-03 – 2016-02-04 (×4): 12.5 mg via ORAL
  Filled 2016-02-03 (×4): qty 1

## 2016-02-03 MED ORDER — ARIPIPRAZOLE 10 MG PO TABS
20.0000 mg | ORAL_TABLET | Freq: Every day | ORAL | Status: DC
  Administered 2016-02-03 – 2016-02-04 (×2): 10 mg via ORAL
  Filled 2016-02-03 (×4): qty 2

## 2016-02-03 MED ORDER — ATORVASTATIN CALCIUM 80 MG PO TABS
80.0000 mg | ORAL_TABLET | Freq: Every day | ORAL | Status: DC
  Administered 2016-02-03 – 2016-02-05 (×3): 80 mg via ORAL
  Filled 2016-02-03 (×3): qty 1

## 2016-02-03 MED ORDER — LORAZEPAM 1 MG PO TABS: 1.0000 mg | ORAL_TABLET | Freq: Four times a day (QID) | ORAL | Status: DC | PRN

## 2016-02-03 NOTE — Progress Notes (Signed)
ANTICOAGULATION CONSULT NOTE - Follow Up Consult  Pharmacy Consult for heparin Indication: chest pain/ACS  Allergies  Allergen Reactions  . Amoxicillin-Pot Clavulanate Nausea Only  . Crestor [Rosuvastatin]     Muscle spasms and cramps  . Codeine     REACTION: nausea  . Penicillins     REACTION: major hives,swelling    Patient Measurements: Height: 5\' 5"  (165.1 cm) Weight: 213 lb 1.6 oz (96.662 kg) IBW/kg (Calculated) : 57 Heparin Dosing Weight: 78.7 kg  Vital Signs: Temp: 98.3 F (36.8 C) (03/18 0400) Temp Source: Oral (03/18 0400) BP: 152/74 mmHg (03/18 0600) Pulse Rate: 95 (03/18 0600)  Labs:  Recent Labs  02/02/16 0420  02/02/16 1230 02/02/16 1316 02/02/16 1948 02/02/16 2332 02/03/16 0317  HGB 12.2  --   --   --   --   --  10.3*  HCT 38.0  --   --   --   --   --  33.2*  PLT 322  --   --   --   --   --  275  APTT 27  --   --   --   --   --   --   LABPROT 12.9  --   --   --   --   --   --   INR 0.95  --   --   --   --   --   --   HEPARINUNFRC  --   --   --  0.26* 0.19*  --  0.61  CREATININE 1.85*  --   --   --   --   --  1.51*  TROPONINI 4.42*  < > 5.11*  --  8.39* 9.51*  --   < > = values in this interval not displayed.  Estimated Creatinine Clearance: 43.9 mL/min (by C-G formula based on Cr of 1.51).   Assessment: [name] admitted 02/02/2016 with chest pain and SOB. No PCI, she has been medically managed and started on heparin per pharmacy.  HL 0.61 (therapeutic), Hgb 10.3, Plt wnl. No s/sx of bleeding noted.  Goal of Therapy:  Heparin level 0.3-0.7 units/ml Monitor platelets by anticoagulation protocol: Yes   Plan:  Continue heparin 1350 units/hr  Check anti-Xa level in 6 hours and daily while on heparin Continue to monitor H&H and platelets  Teresa Hart Teresa Hart, PharmD. PGY-1 Pharmacy Resident Pager: [phone] 02/03/2016,8:38 AM

## 2016-02-03 NOTE — Progress Notes (Addendum)
Patient ID: Teresa Hart, female   DOB: 08/16/1951, 65 y.o.   MRN: [id number]    SUBJECTIVE: No further chest pain, feels fine this morning.   Scheduled Meds: . antiseptic oral rinse  7 mL Mouth Rinse BID  . ARIPiprazole  20 mg Oral Daily  . [START ON 02/05/2016] aspirin  324 mg Oral Pre-Cath  . [START ON 02/04/2016] aspirin EC  81 mg Oral Daily  . atorvastatin  80 mg Oral q1800  . carvedilol  12.5 mg Oral BID WC  . DULoxetine  60 mg Oral BID  . fluticasone  2 spray Each Nare QHS  . hydrALAZINE  25 mg Oral BID  . insulin aspart  0-15 Units Subcutaneous TID WC  . insulin glargine  50 Units Subcutaneous QHS  . loratadine  5 mg Oral QPM  . pantoprazole  40 mg Oral Daily  . sodium chloride flush  3 mL Intravenous Q12H  . sodium chloride flush  3 mL Intravenous Q12H  . Vortioxetine HBr  20 mg Oral Daily   Continuous Infusions: . [START ON 02/05/2016] sodium chloride 3 mL/kg/hr (02/02/16 0949)  . heparin 1,350 Units/hr (02/02/16 2052)  . nitroGLYCERIN 25 mcg/min (02/02/16 2014)   PRN Meds:.sodium chloride, [START ON 02/05/2016] sodium chloride, acetaminophen, albuterol, ALPRAZolam, diazepam, morphine injection, nitroGLYCERIN, ondansetron (ZOFRAN) IV, sodium chloride flush, sodium chloride flush, zolpidem    Filed Vitals:   02/03/16 0300 02/03/16 0400 02/03/16 0500 02/03/16 0600  BP: 148/81 135/64 168/87 152/74  Pulse: 87 93 82 95  Temp:  98.3 F (36.8 C)    TempSrc:  Oral    Resp:      Height:      Weight:    213 lb 1.6 oz (96.662 kg)  SpO2: 97% 96% 99% 96%    Intake/Output Summary (Last 24 hours) at 02/03/16 1001 Last data filed at 02/03/16 0600  Gross per 24 hour  Intake 924.15 ml  Output    650 ml  Net 274.15 ml    LABS: Basic Metabolic Panel:  Recent Labs  40/98/1103/17/17 0420 02/03/16 0317  NA 140 140  K 4.2 4.2  CL 102 105  CO2 26 25  GLUCOSE 160* 115*  BUN 14 11  CREATININE 1.85* 1.51*  CALCIUM 9.6 9.3   Liver Function Tests:  Recent Labs   02/03/16 0317  AST 31  ALT 19  ALKPHOS 88  BILITOT 0.4  PROT 6.4*  ALBUMIN 3.1*   No results for input(s): LIPASE, AMYLASE in the last 72 hours. CBC:  Recent Labs  02/02/16 0420 02/03/16 0317  WBC 14.2* 10.4  NEUTROABS 10.3*  --   HGB 12.2 10.3*  HCT 38.0 33.2*  MCV 92.5 92.5  PLT 322 275   Cardiac Enzymes:  Recent Labs  02/02/16 1230 02/02/16 1948 02/02/16 2332  TROPONINI 5.11* 8.39* 9.51*   BNP: Invalid input(s): POCBNP D-Dimer: No results for input(s): DDIMER in the last 72 hours. Hemoglobin A1C:  Recent Labs  02/02/16 1155  HGBA1C 7.7*   Fasting Lipid Panel:  Recent Labs  02/03/16 0317  CHOL 332*  HDL 48  LDLCALC 222*  TRIG 311*  CHOLHDL 6.9   Thyroid Function Tests:  Recent Labs  02/02/16 1155  TSH 3.918   Anemia Panel: No results for input(s): VITAMINB12, FOLATE, FERRITIN, TIBC, IRON, RETICCTPCT in the last 72 hours.  RADIOLOGY: Dg Chest Port 1 View  02/02/2016  CLINICAL DATA:  Chest pain EXAM: PORTABLE CHEST 1 VIEW COMPARISON:  01/28/2012 FINDINGS: Cardiopericardial enlargement which  is increased from prior. There is no edema, consolidation, effusion, or pneumothorax. No acute osseous finding. IMPRESSION: 1. Cardiopericardial enlargement which has increased from 2013. Pericardial effusion is possible. 2. No edema or pneumonia. Electronically Signed   By: Marnee Spring M.D.   On: 02/02/2016 04:44    PHYSICAL EXAM General: NAD Neck: Thick, JVP difficult, no thyromegaly or thyroid nodule.  Lungs: Clear to auscultation bilaterally with normal respiratory effort. CV: Nondisplaced PMI.  Heart regular S1/S2, no S3/S4, 3/6 systolic crescendo-decrescendo murmur RUSB with some obscuring of S2.  No peripheral edema.   Abdomen: Soft, nontender, no hepatosplenomegaly, no distention.  Neurologic: Alert and oriented x 3.  Psych: Normal affect. Extremities: No clubbing or cyanosis.   TELEMETRY: Reviewed telemetry pt in NSR  ASSESSMENT AND  PLAN: 65 yo with history of bipolar disorder, CKD stage III, carotid disease, mild aortic stenosis, diabetes, and prior cath with non-critical CAD presented with NSTEMI.  1. CAD: NSTEMI.  2013 cath showed non-critical CAD.  She has not been on statin due to myalgias with Crestor and LDL is very high.  No further chest pain this morning.  - Continue heparin gtt and NTG gtt.  - Continue ASA 81.  - Add atorvastatin 80 daily.  - Plan for [facility] on Monday unless she has recurrent severe pain.  - Will stop diltiazem and use Coreg 12.5 mg bid instead.  2. CKD: Stage III.  Creatinine down to 1.5 today.  Will need to limit contrast with cath and will pre-hydrate.  3. Aortic stenosis: Mild on prior echo.  By exam, concerned she may have had some progression.  She got echo today, will need to review.  4. HTN: Stop diltiazem, will use Coreg instead.  Can uptitrate hydralazine as needed.  5. Diabetes: Cover with insulin.  6. Hyperlipidemia: LDL 222.  Has not been able to take Crestor.  Will start atorvastatin 80 but may end up needing Repatha.   Marca Ancona 02/03/2016 10:06 AM

## 2016-02-03 NOTE — Progress Notes (Signed)
  Echocardiogram 2D Echocardiogram has been performed.  Janalyn HarderWest, Nolan Lasser R 02/03/2016, 9:52 AM

## 2016-02-03 NOTE — Progress Notes (Signed)
ANTICOAGULATION CONSULT NOTE - Follow Up Consult  Pharmacy Consult for heparin Indication: chest pain/ACS  Allergies  Allergen Reactions  . Amoxicillin-Pot Clavulanate Nausea Only  . Crestor [Rosuvastatin]     Muscle spasms and cramps  . Codeine     REACTION: nausea  . Penicillins     REACTION: major hives,swelling    Patient Measurements: Height: 5\' 5"  (165.1 cm) Weight: 213 lb 1.6 oz (96.662 kg) IBW/kg (Calculated) : 57 Heparin Dosing Weight: 78.7 kg  Vital Signs: Temp: 97.5 F (36.4 C) (03/18 1210) Temp Source: Oral (03/18 1210) BP: 114/55 mmHg (03/18 1400) Pulse Rate: 96 (03/18 0900)  Labs:  Recent Labs  02/02/16 0420  02/02/16 1230  02/02/16 1948 02/02/16 2332 02/03/16 0317 02/03/16 1430  HGB 12.2  --   --   --   --   --  10.3*  --   HCT 38.0  --   --   --   --   --  33.2*  --   PLT 322  --   --   --   --   --  275  --   APTT 27  --   --   --   --   --   --   --   LABPROT 12.9  --   --   --   --   --   --   --   INR 0.95  --   --   --   --   --   --   --   HEPARINUNFRC  --   --   --   < > 0.19*  --  0.61 0.41  CREATININE 1.85*  --   --   --   --   --  1.51*  --   TROPONINI 4.42*  < > 5.11*  --  8.39* 9.51*  --   --   < > = values in this interval not displayed.  Estimated Creatinine Clearance: 43.9 mL/min (by C-G formula based on Cr of 1.51).   Assessment: [name] admitted 02/02/2016 with chest pain and SOB. No PCI, she has been medically managed and started on heparin per pharmacy.  HL remains therapeutic at 0.41. Hgb 10.3, Plt wnl. No s/sx of bleeding noted.  Goal of Therapy:  Heparin level 0.3-0.7 units/ml Monitor platelets by anticoagulation protocol: Yes   Plan:  Continue heparin 1350 units/hr  Check anti-Xa level daily while on heparin Continue to monitor H&H and platelets  Akari Defelice D. Petrina Melby, PharmD, BCPS Clinical Pharmacist Pager: [phone] 02/03/2016 4:40 PM

## 2016-02-04 LAB — BASIC METABOLIC PANEL
Anion gap: 9 (ref 5–15)
BUN: 19 mg/dL (ref 6–20)
CHLORIDE: 104 mmol/L (ref 101–111)
CO2: 25 mmol/L (ref 22–32)
CREATININE: 1.91 mg/dL — AB (ref 0.44–1.00)
Calcium: 9.1 mg/dL (ref 8.9–10.3)
GFR calc Af Amer: 31 mL/min — ABNORMAL LOW (ref >60)
GFR calc non Af Amer: 27 mL/min — ABNORMAL LOW (ref >60)
GLUCOSE: 164 mg/dL — AB (ref 65–99)
Potassium: 4.5 mmol/L (ref 3.5–5.1)
Sodium: 138 mmol/L (ref 135–145)

## 2016-02-04 LAB — CBC
HCT: 31.4 % — ABNORMAL LOW (ref 36.0–46.0)
Hemoglobin: 9.7 g/dL — ABNORMAL LOW (ref 12.0–15.0)
MCH: 28.5 pg (ref 26.0–34.0)
MCHC: 30.9 g/dL (ref 30.0–36.0)
MCV: 92.4 fL (ref 78.0–100.0)
PLATELETS: 243 10*3/uL (ref 150–400)
RBC: 3.4 MIL/uL — AB (ref 3.87–5.11)
RDW: 15.7 % — AB (ref 11.5–15.5)
WBC: 10.3 10*3/uL (ref 4.0–10.5)

## 2016-02-04 LAB — GLUCOSE, CAPILLARY
GLUCOSE-CAPILLARY: 240 mg/dL — AB (ref 65–99)
Glucose-Capillary: 161 mg/dL — ABNORMAL HIGH (ref 65–99)
Glucose-Capillary: 145 mg/dL — ABNORMAL HIGH (ref 65–99)
Glucose-Capillary: 208 mg/dL — ABNORMAL HIGH (ref 65–99)

## 2016-02-04 LAB — HEPARIN LEVEL (UNFRACTIONATED): Heparin Unfractionated: 0.57 IU/mL (ref 0.30–0.70)

## 2016-02-04 MED ORDER — SODIUM CHLORIDE 0.9 % IV SOLN: 250.0000 mL | INTRAVENOUS | Status: DC | PRN

## 2016-02-04 MED ORDER — SODIUM CHLORIDE 0.9% FLUSH: 3.0000 mL | INTRAVENOUS | Status: DC | PRN

## 2016-02-04 MED ORDER — SODIUM CHLORIDE 0.9 % WEIGHT BASED INFUSION
1.0000 mL/kg/h | INTRAVENOUS | Status: DC
  Administered 2016-02-04: 1 mL/kg/h via INTRAVENOUS

## 2016-02-04 MED ORDER — PANTOPRAZOLE SODIUM 40 MG PO TBEC
40.0000 mg | DELAYED_RELEASE_TABLET | Freq: Two times a day (BID) | ORAL | Status: DC
  Administered 2016-02-04 – 2016-02-06 (×4): 40 mg via ORAL
  Filled 2016-02-04 (×4): qty 1

## 2016-02-04 MED ORDER — SODIUM CHLORIDE 0.9% FLUSH: 3.0000 mL | Freq: Two times a day (BID) | INTRAVENOUS | Status: DC

## 2016-02-04 MED ORDER — ASPIRIN 81 MG PO CHEW: 81.0000 mg | CHEWABLE_TABLET | ORAL | Status: DC

## 2016-02-04 NOTE — Progress Notes (Signed)
ANTICOAGULATION CONSULT NOTE - Follow Up Consult  Pharmacy Consult for heparin Indication: chest pain/ACS  Allergies  Allergen Reactions  . Amoxicillin-Pot Clavulanate Nausea Only  . Crestor [Rosuvastatin]     Muscle spasms and cramps  . Codeine     REACTION: nausea  . Penicillins     REACTION: major hives,swelling    Patient Measurements: Height: 5\' 5"  (165.1 cm) Weight: 217 lb 2.5 oz (98.5 kg) IBW/kg (Calculated) : 57 Heparin Dosing Weight: 78 kg  Vital Signs: Temp: 98.3 F (36.8 C) (03/19 0317) Temp Source: Oral (03/19 0317) BP: 100/57 mmHg (03/19 0317)  Labs:  Recent Labs  02/02/16 0420  02/02/16 1230  02/02/16 1948 02/02/16 2332 02/03/16 0317 02/03/16 1430 02/04/16 0230  HGB 12.2  --   --   --   --   --  10.3*  --  9.7*  HCT 38.0  --   --   --   --   --  33.2*  --  31.4*  PLT 322  --   --   --   --   --  275  --  243  APTT 27  --   --   --   --   --   --   --   --   LABPROT 12.9  --   --   --   --   --   --   --   --   INR 0.95  --   --   --   --   --   --   --   --   HEPARINUNFRC  --   --   --   < > 0.19*  --  0.61 0.41 0.57  CREATININE 1.85*  --   --   --   --   --  1.51*  --  1.91*  TROPONINI 4.42*  < > 5.11*  --  8.39* 9.51*  --   --   --   < > = values in this interval not displayed.  Estimated Creatinine Clearance: 35 mL/min (by C-G formula based on Cr of 1.91).   Assessment: [name] admitted 02/02/2016 with chest pain and SOB. No PCI, she has been medically managed and started on heparin per pharmacy.  HL 0.57 (therapeutic), Hgb 9.7, Plt wnl. No s/sx of bleeding noted.  Goal of Therapy:  Heparin level 0.3-0.7 units/ml Monitor platelets by anticoagulation protocol: Yes   Plan:  Continue heparin 1350 units/hr  Monitor daily HL, CBC and s/sx of bleeding  Casilda Carlsaylor Kensly Bowmer, PharmD. PGY-1 Pharmacy Resident Pager: [phone] 02/04/2016,8:05 AM

## 2016-02-04 NOTE — Progress Notes (Signed)
Patient ID: Teresa Hart, female   DOB: 11-18-1951, 65 y.o.   MRN: [id number]    SUBJECTIVE: She had some chest pain this morning, resolved with increase NTG.  ECG showed anterolateral TWIs.    Scheduled Meds: . antiseptic oral rinse  7 mL Mouth Rinse BID  . ARIPiprazole  20 mg Oral Daily  . [START ON 02/05/2016] aspirin  324 mg Oral Pre-Cath  . aspirin EC  81 mg Oral Daily  . atorvastatin  80 mg Oral q1800  . carvedilol  12.5 mg Oral BID WC  . DULoxetine  60 mg Oral BID  . fluticasone  2 spray Each Nare QHS  . hydrALAZINE  25 mg Oral BID  . insulin aspart  0-15 Units Subcutaneous TID WC  . insulin glargine  50 Units Subcutaneous QHS  . loratadine  5 mg Oral QPM  . pantoprazole  40 mg Oral Daily  . sodium chloride flush  3 mL Intravenous Q12H  . sodium chloride flush  3 mL Intravenous Q12H  . Vortioxetine HBr  20 mg Oral Daily   Continuous Infusions: . [START ON 02/05/2016] sodium chloride 3 mL/kg/hr (02/02/16 0949)  . heparin 1,350 Units/hr (02/04/16 0616)  . nitroGLYCERIN 20 mcg/min (02/04/16 0900)   PRN Meds:.sodium chloride, [START ON 02/05/2016] sodium chloride, acetaminophen, albuterol, ALPRAZolam, diazepam, morphine injection, nitroGLYCERIN, ondansetron (ZOFRAN) IV, sodium chloride flush, sodium chloride flush, zolpidem    Filed Vitals:   02/03/16 1914 02/03/16 2313 02/04/16 0317 02/04/16 0827  BP: 102/56 128/64 100/57 116/56  Pulse:      Temp: 98.5 F (36.9 C) 97.9 F (36.6 C) 98.3 F (36.8 C) 98 F (36.7 C)  TempSrc: Oral Oral Oral Oral  Resp: Height:      Weight:   217 lb 2.5 oz (98.5 kg)   SpO2: 98%  97% 97%    Intake/Output Summary (Last 24 hours) at 02/04/16 0950 Last data filed at 02/04/16 0900  Gross per 24 hour  Intake   1377 ml  Output      0 ml  Net   1377 ml    LABS: Basic Metabolic Panel:  Recent Labs  13/24/40 0317 02/04/16 0230  NA 140 138  K 4.2 4.5  CL 105 104  CO2 25 25  GLUCOSE 115* 164*  BUN 11 19  CREATININE  1.51* 1.91*  CALCIUM 9.3 9.1   Liver Function Tests:  Recent Labs  02/03/16 0317  AST 31  ALT 19  ALKPHOS 88  BILITOT 0.4  PROT 6.4*  ALBUMIN 3.1*   No results for input(s): LIPASE, AMYLASE in the last 72 hours. CBC:  Recent Labs  02/02/16 0420 02/03/16 0317 02/04/16 0230  WBC 14.2* 10.4 10.3  NEUTROABS 10.3*  --   --   HGB 12.2 10.3* 9.7*  HCT 38.0 33.2* 31.4*  MCV 92.5 92.5 92.4  PLT 322 275 243   Cardiac Enzymes:  Recent Labs  02/02/16 1230 02/02/16 1948 02/02/16 2332  TROPONINI 5.11* 8.39* 9.51*   BNP: Invalid input(s): POCBNP D-Dimer: No results for input(s): DDIMER in the last 72 hours. Hemoglobin A1C:  Recent Labs  02/02/16 1155  HGBA1C 7.7*   Fasting Lipid Panel:  Recent Labs  02/03/16 0317  CHOL 332*  HDL 48  LDLCALC 222*  TRIG 311*  CHOLHDL 6.9   Thyroid Function Tests:  Recent Labs  02/02/16 1155  TSH 3.918   Anemia Panel: No results for input(s): VITAMINB12, FOLATE, FERRITIN, TIBC, IRON, RETICCTPCT in  the last 72 hours.  RADIOLOGY: Dg Chest Port 1 View  02/02/2016  CLINICAL DATA:  Chest pain EXAM: PORTABLE CHEST 1 VIEW COMPARISON:  01/28/2012 FINDINGS: Cardiopericardial enlargement which is increased from prior. There is no edema, consolidation, effusion, or pneumothorax. No acute osseous finding. IMPRESSION: 1. Cardiopericardial enlargement which has increased from 2013. Pericardial effusion is possible. 2. No edema or pneumonia. Electronically Signed   By: Marnee SpringJonathon  Watts M.D.   On: 02/02/2016 04:44    PHYSICAL EXAM General: NAD Neck: Thick, JVP difficult, no thyromegaly or thyroid nodule.  Lungs: Clear to auscultation bilaterally with normal respiratory effort. CV: Nondisplaced PMI.  Heart regular S1/S2, no S3/S4, 2/6 systolic crescendo-decrescendo murmur RUSB with clear S2.  No peripheral edema.   Abdomen: Soft, nontender, no hepatosplenomegaly, no distention.  Neurologic: Alert and oriented x 3.  Psych: Normal  affect. Extremities: No clubbing or cyanosis.   TELEMETRY: Reviewed telemetry pt in NSR  ASSESSMENT AND PLAN: 65 yo with history of bipolar disorder, CKD stage III, carotid disease, mild aortic stenosis, diabetes, and prior cath with non-critical CAD presented with NSTEMI.  1. CAD: NSTEMI.  2013 cath showed non-critical CAD.  She has not been on statin due to myalgias with Crestor and LDL is very high.  Chest pain earlier this morning, now resolved.  Anterolateral TWIs on ECG.   - Continue heparin gtt and NTG gtt.  - Continue ASA 81.  - Continue atorvastatin 80 daily.  [facility] is un[facility].  - Continue Coreg 12.5 mg bid.   2. CKD: Stage III.  Creatinine up to 1.9 today.  Will need to limit contrast with cath and will pre-hydrate starting this evening.  3. Aortic stenosis: Mild by echo.   4. HTN: Controlled on Coreg and hydralazine.  5. Diabetes: Cover with insulin.  6. Hyperlipidemia: LDL 222.  Has not been able to take Crestor.  Will start atorvastatin 80 but may end up needing Repatha.   Marca AnconaDalton Macallister Ashmead 02/04/2016 9:50 AM

## 2016-02-05 ENCOUNTER — Encounter (HOSPITAL_COMMUNITY)
Admission: EM | Disposition: A | Discharge status: Home / Self Care | Payer: Self-pay | Source: Home / Self Care | Attending: Cardiovascular Disease

## 2016-02-05 HISTORY — PX: CARDIAC CATHETERIZATION: SHX172

## 2016-02-05 LAB — CBC
HCT: 31.7 % — ABNORMAL LOW (ref 36.0–46.0)
Hemoglobin: 9.8 g/dL — ABNORMAL LOW (ref 12.0–15.0)
MCH: 28.6 pg (ref 26.0–34.0)
MCHC: 30.9 g/dL (ref 30.0–36.0)
MCV: 92.4 fL (ref 78.0–100.0)
PLATELETS: 247 10*3/uL (ref 150–400)
RBC: 3.43 MIL/uL — ABNORMAL LOW (ref 3.87–5.11)
RDW: 15.6 % — AB (ref 11.5–15.5)
WBC: 9.7 10*3/uL (ref 4.0–10.5)

## 2016-02-05 LAB — BASIC METABOLIC PANEL
Anion gap: 9 (ref 5–15)
BUN: 22 mg/dL — AB (ref 6–20)
CHLORIDE: 106 mmol/L (ref 101–111)
CO2: 24 mmol/L (ref 22–32)
CREATININE: 1.76 mg/dL — AB (ref 0.44–1.00)
Calcium: 9.3 mg/dL (ref 8.9–10.3)
GFR calc non Af Amer: 30 mL/min — ABNORMAL LOW (ref >60)
GFR, EST AFRICAN AMERICAN: 34 mL/min — AB (ref >60)
GLUCOSE: 193 mg/dL — AB (ref 65–99)
Potassium: 4.5 mmol/L (ref 3.5–5.1)
Sodium: 139 mmol/L (ref 135–145)

## 2016-02-05 LAB — GLUCOSE, CAPILLARY
GLUCOSE-CAPILLARY: 256 mg/dL — AB (ref 65–99)
Glucose-Capillary: 162 mg/dL — ABNORMAL HIGH (ref 65–99)
Glucose-Capillary: 178 mg/dL — ABNORMAL HIGH (ref 65–99)
Glucose-Capillary: 87 mg/dL (ref 65–99)

## 2016-02-05 LAB — POCT ACTIVATED CLOTTING TIME: Activated Clotting Time: 513 seconds

## 2016-02-05 LAB — HEPARIN LEVEL (UNFRACTIONATED): Heparin Unfractionated: 0.48 IU/mL (ref 0.30–0.70)

## 2016-02-05 SURGERY — LEFT HEART CATH AND CORONARY ANGIOGRAPHY
Anesthesia: LOCAL

## 2016-02-05 MED ORDER — VERAPAMIL HCL 2.5 MG/ML IV SOLN
INTRAVENOUS | Status: DC | PRN
  Administered 2016-02-05: 10 mL via INTRA_ARTERIAL

## 2016-02-05 MED ORDER — SODIUM CHLORIDE 0.9 % IV SOLN: 250.0000 mL | INTRAVENOUS | Status: DC | PRN

## 2016-02-05 MED ORDER — NITROGLYCERIN 1 MG/10 ML FOR IR/CATH LAB
INTRA_ARTERIAL | Status: AC
  Filled 2016-02-05: qty 10

## 2016-02-05 MED ORDER — SODIUM CHLORIDE 0.9 % IV SOLN
250.0000 mg | INTRAVENOUS | Status: DC | PRN
  Administered 2016-02-05: 1.75 mg/kg/h via INTRAVENOUS

## 2016-02-05 MED ORDER — TICAGRELOR 90 MG PO TABS
ORAL_TABLET | ORAL | Status: DC | PRN
  Administered 2016-02-05: 180 mg via ORAL

## 2016-02-05 MED ORDER — LIDOCAINE HCL (PF) 1 % IJ SOLN
INTRAMUSCULAR | Status: DC | PRN
  Administered 2016-02-05: 5 mL

## 2016-02-05 MED ORDER — HEPARIN SODIUM (PORCINE) 1000 UNIT/ML IJ SOLN
INTRAMUSCULAR | Status: DC | PRN
  Administered 2016-02-05: 5000 [IU] via INTRAVENOUS

## 2016-02-05 MED ORDER — MIDAZOLAM HCL 2 MG/2ML IJ SOLN
INTRAMUSCULAR | Status: AC
  Filled 2016-02-05: qty 2

## 2016-02-05 MED ORDER — CLOPIDOGREL BISULFATE 75 MG PO TABS
75.0000 mg | ORAL_TABLET | Freq: Every day | ORAL | Status: DC
  Administered 2016-02-06: 75 mg via ORAL
  Filled 2016-02-05: qty 1

## 2016-02-05 MED ORDER — ALPRAZOLAM 0.25 MG PO TABS
ORAL_TABLET | ORAL | Status: AC
  Filled 2016-02-05: qty 1

## 2016-02-05 MED ORDER — TICAGRELOR 90 MG PO TABS
ORAL_TABLET | ORAL | Status: AC
  Filled 2016-02-05: qty 1

## 2016-02-05 MED ORDER — LIDOCAINE HCL (PF) 1 % IJ SOLN
INTRAMUSCULAR | Status: AC
  Filled 2016-02-05: qty 30

## 2016-02-05 MED ORDER — BIVALIRUDIN 250 MG IV SOLR
INTRAVENOUS | Status: AC
  Filled 2016-02-05: qty 250

## 2016-02-05 MED ORDER — TICAGRELOR 90 MG PO TABS
90.0000 mg | ORAL_TABLET | Freq: Two times a day (BID) | ORAL | Status: DC
  Filled 2016-02-05: qty 1

## 2016-02-05 MED ORDER — HEART ATTACK BOUNCING BOOK
Freq: Once | Status: AC
  Administered 2016-02-05: 18:00:00
  Filled 2016-02-05: qty 1

## 2016-02-05 MED ORDER — MIDAZOLAM HCL 2 MG/2ML IJ SOLN
INTRAMUSCULAR | Status: DC | PRN
  Administered 2016-02-05 (×2): 1 mg via INTRAVENOUS

## 2016-02-05 MED ORDER — CLOPIDOGREL BISULFATE 75 MG PO TABS
600.0000 mg | ORAL_TABLET | Freq: Once | ORAL | Status: AC
  Administered 2016-02-05: 23:00:00 600 mg via ORAL
  Filled 2016-02-05: qty 8

## 2016-02-05 MED ORDER — SODIUM CHLORIDE 0.9 % WEIGHT BASED INFUSION: 1.0000 mL/kg/h | INTRAVENOUS | Status: DC

## 2016-02-05 MED ORDER — ARIPIPRAZOLE 10 MG PO TABS
10.0000 mg | ORAL_TABLET | Freq: Every day | ORAL | Status: DC
  Administered 2016-02-05: 23:00:00 10 mg via ORAL

## 2016-02-05 MED ORDER — ANGIOPLASTY BOOK
Freq: Once | Status: AC
  Administered 2016-02-05: 18:00:00
  Filled 2016-02-05: qty 1

## 2016-02-05 MED ORDER — FENTANYL CITRATE (PF) 100 MCG/2ML IJ SOLN
INTRAMUSCULAR | Status: AC
  Filled 2016-02-05: qty 2

## 2016-02-05 MED ORDER — BIVALIRUDIN BOLUS VIA INFUSION - CUPID
INTRAVENOUS | Status: DC | PRN
  Administered 2016-02-05: 73.2 mg via INTRAVENOUS

## 2016-02-05 MED ORDER — CARVEDILOL 25 MG PO TABS
25.0000 mg | ORAL_TABLET | Freq: Two times a day (BID) | ORAL | Status: DC
  Administered 2016-02-05 – 2016-02-06 (×3): 25 mg via ORAL
  Filled 2016-02-05 (×2): qty 2
  Filled 2016-02-05 (×3): qty 1

## 2016-02-05 MED ORDER — SODIUM CHLORIDE 0.9% FLUSH: 3.0000 mL | Freq: Two times a day (BID) | INTRAVENOUS | Status: DC

## 2016-02-05 MED ORDER — SODIUM CHLORIDE 0.9% FLUSH: 3.0000 mL | INTRAVENOUS | Status: DC | PRN

## 2016-02-05 MED ORDER — NITROGLYCERIN 1 MG/10 ML FOR IR/CATH LAB
INTRA_ARTERIAL | Status: DC | PRN
Start: 2016-02-05 — End: 2016-02-05
  Administered 2016-02-05: 100 ug via INTRACORONARY

## 2016-02-05 MED ORDER — HYDRALAZINE HCL 25 MG PO TABS
25.0000 mg | ORAL_TABLET | Freq: Three times a day (TID) | ORAL | Status: DC
  Administered 2016-02-05 – 2016-02-06 (×3): 25 mg via ORAL
  Filled 2016-02-05 (×3): qty 1

## 2016-02-05 MED ORDER — VERAPAMIL HCL 2.5 MG/ML IV SOLN
INTRAVENOUS | Status: AC
  Filled 2016-02-05: qty 2

## 2016-02-05 MED ORDER — FENTANYL CITRATE (PF) 100 MCG/2ML IJ SOLN
INTRAMUSCULAR | Status: DC | PRN
  Administered 2016-02-05 (×2): 25 ug via INTRAVENOUS

## 2016-02-05 MED ORDER — HEPARIN (PORCINE) IN NACL 2-0.9 UNIT/ML-% IJ SOLN
INTRAMUSCULAR | Status: AC
  Filled 2016-02-05: qty 1000

## 2016-02-05 SURGICAL SUPPLY — 18 items
BALLN EUPHORA RX 2.5X15 (BALLOONS) ×3
BALLN ~~LOC~~ EMERGE MR 3.0X15 (BALLOONS) ×3
BALLOON EUPHORA RX 2.5X15 (BALLOONS) ×2 IMPLANT
BALLOON ~~LOC~~ EMERGE MR 3.0X15 (BALLOONS) ×2 IMPLANT
CATH INFINITI 5 FR JL3.5 (CATHETERS) ×3 IMPLANT
CATH INFINITI JR4 5F (CATHETERS) ×3 IMPLANT
DEVICE RAD COMP TR BAND LRG (VASCULAR PRODUCTS) ×3 IMPLANT
GLIDESHEATH SLEND SS 6F .021 (SHEATH) ×3 IMPLANT
GUIDE CATH RUNWAY 6FR FR4 (CATHETERS) ×3 IMPLANT
KIT ENCORE 26 ADVANTAGE (KITS) ×3 IMPLANT
KIT HEART LEFT (KITS) ×3 IMPLANT
PACK CARDIAC CATHETERIZATION (CUSTOM PROCEDURE TRAY) ×3 IMPLANT
STENT PROMUS PREM MR 2.75X24 (Permanent Stent) ×3 IMPLANT
TRANSDUCER W/STOPCOCK (MISCELLANEOUS) ×3 IMPLANT
TUBING CIL FLEX 10 FLL-RA (TUBING) ×3 IMPLANT
WIRE ASAHI PROWATER 180CM (WIRE) ×3 IMPLANT
WIRE HI TORQ VERSACORE-J 145CM (WIRE) ×3 IMPLANT
WIRE SAFE-T 1.5MM-J .035X260CM (WIRE) ×3 IMPLANT

## 2016-02-05 NOTE — Progress Notes (Signed)
Patient refused lantus insulin because of cbg of 87. Stated she did not want to take Brilinta because of the shortness of breath. "I just can't go through that again." Stated the doctor told her she could switch to Plavix if she couldn't tolerate the Brilinta. She also stated she takes 10 mg of Abilify daily and when reviewing her home meds it was 10 mg daily. Dr. Onalee HuaAlvarez notified of above and ordered Plavix 600 mg now, dc'd Brilinta, modified Abilify to 10 mg daily and aware of patient refusing lantus tonight. Patient verbalized appreciation of changes to medications.

## 2016-02-05 NOTE — H&P (View-Only) (Signed)
Patient ID: Teresa Hart, female   DOB: 03/14/1951, 65 y.o.   MRN: [id number]    SUBJECTIVE: She had some chest pain this morning, resolved with increase NTG.  ECG showed anterolateral TWIs.    Scheduled Meds: . antiseptic oral rinse  7 mL Mouth Rinse BID  . ARIPiprazole  20 mg Oral Daily  . [START ON 02/05/2016] aspirin  324 mg Oral Pre-Cath  . aspirin EC  81 mg Oral Daily  . atorvastatin  80 mg Oral q1800  . carvedilol  12.5 mg Oral BID WC  . DULoxetine  60 mg Oral BID  . fluticasone  2 spray Each Nare QHS  . hydrALAZINE  25 mg Oral BID  . insulin aspart  0-15 Units Subcutaneous TID WC  . insulin glargine  50 Units Subcutaneous QHS  . loratadine  5 mg Oral QPM  . pantoprazole  40 mg Oral Daily  . sodium chloride flush  3 mL Intravenous Q12H  . sodium chloride flush  3 mL Intravenous Q12H  . Vortioxetine HBr  20 mg Oral Daily   Continuous Infusions: . [START ON 02/05/2016] sodium chloride 3 mL/kg/hr (02/02/16 0949)  . heparin 1,350 Units/hr (02/04/16 0616)  . nitroGLYCERIN 20 mcg/min (02/04/16 0900)   PRN Meds:.sodium chloride, [START ON 02/05/2016] sodium chloride, acetaminophen, albuterol, ALPRAZolam, diazepam, morphine injection, nitroGLYCERIN, ondansetron (ZOFRAN) IV, sodium chloride flush, sodium chloride flush, zolpidem    Filed Vitals:   02/03/16 1914 02/03/16 2313 02/04/16 0317 02/04/16 0827  BP: 102/56 128/64 100/57 116/56  Pulse:      Temp: 98.5 F (36.9 C) 97.9 F (36.6 C) 98.3 F (36.8 C) 98 F (36.7 C)  TempSrc: Oral Oral Oral Oral  Resp: 20 18 18 18  Height:      Weight:   217 lb 2.5 oz (98.5 kg)   SpO2: 98%  97% 97%    Intake/Output Summary (Last 24 hours) at 02/04/16 0950 Last data filed at 02/04/16 0900  Gross per 24 hour  Intake   1377 ml  Output      0 ml  Net   1377 ml    LABS: Basic Metabolic Panel:  Recent Labs  02/03/16 0317 02/04/16 0230  NA 140 138  K 4.2 4.5  CL 105 104  CO2 25 25  GLUCOSE 115* 164*  BUN 11 19  CREATININE  1.51* 1.91*  CALCIUM 9.3 9.1   Liver Function Tests:  Recent Labs  02/03/16 0317  AST 31  ALT 19  ALKPHOS 88  BILITOT 0.4  PROT 6.4*  ALBUMIN 3.1*   No results for input(s): LIPASE, AMYLASE in the last 72 hours. CBC:  Recent Labs  02/02/16 0420 02/03/16 0317 02/04/16 0230  WBC 14.2* 10.4 10.3  NEUTROABS 10.3*  --   --   HGB 12.2 10.3* 9.7*  HCT 38.0 33.2* 31.4*  MCV 92.5 92.5 92.4  PLT 322 275 243   Cardiac Enzymes:  Recent Labs  02/02/16 1230 02/02/16 1948 02/02/16 2332  TROPONINI 5.11* 8.39* 9.51*   BNP: Invalid input(s): POCBNP D-Dimer: No results for input(s): DDIMER in the last 72 hours. Hemoglobin A1C:  Recent Labs  02/02/16 1155  HGBA1C 7.7*   Fasting Lipid Panel:  Recent Labs  02/03/16 0317  CHOL 332*  HDL 48  LDLCALC 222*  TRIG 311*  CHOLHDL 6.9   Thyroid Function Tests:  Recent Labs  02/02/16 1155  TSH 3.918   Anemia Panel: No results for input(s): VITAMINB12, FOLATE, FERRITIN, TIBC, IRON, RETICCTPCT in   the last 72 hours.  RADIOLOGY: Dg Chest Port 1 View  02/02/2016  CLINICAL DATA:  Chest pain EXAM: PORTABLE CHEST 1 VIEW COMPARISON:  01/28/2012 FINDINGS: Cardiopericardial enlargement which is increased from prior. There is no edema, consolidation, effusion, or pneumothorax. No acute osseous finding. IMPRESSION: 1. Cardiopericardial enlargement which has increased from 2013. Pericardial effusion is possible. 2. No edema or pneumonia. Electronically Signed   By: Jonathon  Watts M.D.   On: 02/02/2016 04:44    PHYSICAL EXAM General: NAD Neck: Thick, JVP difficult, no thyromegaly or thyroid nodule.  Lungs: Clear to auscultation bilaterally with normal respiratory effort. CV: Nondisplaced PMI.  Heart regular S1/S2, no S3/S4, 2/6 systolic crescendo-decrescendo murmur RUSB with clear S2.  No peripheral edema.   Abdomen: Soft, nontender, no hepatosplenomegaly, no distention.  Neurologic: Alert and oriented x 3.  Psych: Normal  affect. Extremities: No clubbing or cyanosis.   TELEMETRY: Reviewed telemetry pt in NSR  ASSESSMENT AND PLAN: 65 yo with history of bipolar disorder, CKD stage III, carotid disease, mild aortic stenosis, diabetes, and prior cath with non-critical CAD presented with NSTEMI.  1. CAD: NSTEMI.  2013 cath showed non-critical CAD.  She has not been on statin due to myalgias with Crestor and LDL is very high.  Chest pain earlier this morning, now resolved.  Anterolateral TWIs on ECG.   - Continue heparin gtt and NTG gtt.  - Continue ASA 81.  - Continue atorvastatin 80 daily.  - Plan for [facility] on Monday unless pain is uncontrollable.  - Continue Coreg 12.5 mg bid.   2. CKD: Stage III.  Creatinine up to 1.9 today.  Will need to limit contrast with cath and will pre-hydrate starting this evening.  3. Aortic stenosis: Mild by echo.   4. HTN: Controlled on Coreg and hydralazine.  5. Diabetes: Cover with insulin.  6. Hyperlipidemia: LDL 222.  Has not been able to take Crestor.  Will start atorvastatin 80 but may end up needing Repatha.   Jhovani Griswold 02/04/2016 9:50 AM  

## 2016-02-05 NOTE — Interval H&P Note (Signed)
History and Physical Interval Note:  02/05/2016 7:41 AM  Teresa Hart  has presented today for surgery, with the diagnosis of cp  The various methods of treatment have been discussed with the patient and family. After consideration of risks, benefits and other options for treatment, the patient has consented to  Procedure(s): Left Heart Cath and Coronary Angiography (N/A) as a surgical intervention .  The patient's history has been reviewed, patient examined, no change in status, stable for surgery.  I have reviewed the patient's chart and labs.  Questions were answered to the patient's satisfaction.   Cath Lab Visit (complete for each Cath Lab visit)  Clinical Evaluation Leading to the Procedure:   ACS: Yes.    Non-ACS:    Anginal Classification: CCS IV  Anti-ischemic medical therapy: Minimal Therapy (1 class of medications)  Non-Invasive Test Results: No non-invasive testing performed  Prior CABG: No previous CABG        Jeronda Don [place] MD,FACC 02/05/2016 7:41 AM

## 2016-02-05 NOTE — Progress Notes (Signed)
TR BAND REMOVAL  LOCATION:  right radial  DEFLATED PER PROTOCOL:  Yes.    TIME BAND OFF / DRESSING APPLIED:   1300   SITE UPON ARRIVAL:   Level 0  SITE AFTER BAND REMOVAL:  Level  CIRCULATION SENSATION AND MOVEMENT:  Within Normal Limits  Yes.    COMMENTS:

## 2016-02-05 NOTE — Progress Notes (Signed)
Patient c/o moderate intermittent SOB, trouble "catching my breath".  Possibly Brilinta related.  Patient quite comfortable post caffeine and valium administration.

## 2016-02-05 NOTE — Progress Notes (Signed)
States she feels anxious. Denies pain. Ambulated to BR to void. Tolerated well. Medicated for anxiety.

## 2016-02-05 NOTE — Progress Notes (Signed)
PATIENT ID: Ms. Oneida AlarHaithcock is a [age]F with CKD III, carotid artery disease, mild AS, diabetes mellitus, and CAD here with NSTEMI.  Now s/p DES to the RCA.   SUBJECTIVE:  Ms. Oneida AlarHaithcock is feeling well.  She denies any recurrent chest pain.  She has noted some shortness of breath after starting ticagrelor.     PHYSICAL EXAM Filed Vitals:   02/05/16 1115 02/05/16 1200 02/05/16 1230 02/05/16 1300  BP: 167/65 158/69 130/69 132/49  Pulse: 82 83 80 73  Temp:  98 F (36.7 C)    TempSrc:  Oral    Resp: 21 19 34 17  Height:      Weight:      SpO2: 99% 98% 97% 100%   General:  Well-appearing.  No acute distress.  Neck: No JVD.  Lungs:  CTAB.  No crackles, rhonchi or wheezes Heart:  RRR.  No m/r/g.  Normal S1/S2.  R radial cath site stable. Abdomen:  Soft.  Non-tender.  Non-distended.  +BS Extremities:  WWP.  No edema.  LABS: Lab Results  Component Value Date   TROPONINI 9.51* 02/02/2016   Results for orders placed or performed during the hospital encounter of 02/02/16 (from the past 24 hour(s))  Glucose, capillary     Status: Abnormal   Collection Time: 02/04/16  9:46 PM  Result Value Ref Range   Glucose-Capillary 208 (H) 65 - 99 mg/dL   Comment 1 Capillary Specimen   Basic metabolic panel     Status: Abnormal   Collection Time: 02/05/16  2:15 AM  Result Value Ref Range   Sodium 139 135 - 145 mmol/L   Potassium 4.5 3.5 - 5.1 mmol/L   Chloride 106 101 - 111 mmol/L   CO2 24 22 - 32 mmol/L   Glucose, Bld 193 (H) 65 - 99 mg/dL   BUN 22 (H) 6 - 20 mg/dL   Creatinine, Ser 5.781.76 (H) 0.44 - 1.00 mg/dL   Calcium 9.3 8.9 - 46.910.3 mg/dL   GFR calc non Af Amer 30 (L) >60 mL/min   GFR calc Af Amer 34 (L) >60 mL/min   Anion gap 9 5 - 15  CBC     Status: Abnormal   Collection Time: 02/05/16  2:15 AM  Result Value Ref Range   WBC 9.7 4.0 - 10.5 K/uL   RBC 3.43 (L) 3.87 - 5.11 MIL/uL   Hemoglobin 9.8 (L) 12.0 - 15.0 g/dL   HCT 62.931.7 (L) 52.836.0 - 41.346.0 %   MCV 92.4 78.0 - 100.0 fL   MCH 28.6  26.0 - 34.0 pg   MCHC 30.9 30.0 - 36.0 g/dL   RDW 24.415.6 (H) 01.011.5 - 27.215.5 %   Platelets 247 150 - 400 K/uL  Heparin level (unfractionated)     Status: None   Collection Time: 02/05/16  2:15 AM  Result Value Ref Range   Heparin Unfractionated 0.48 0.30 - 0.70 IU/mL  POCT Activated clotting time     Status: None   Collection Time: 02/05/16  8:10 AM  Result Value Ref Range   Activated Clotting Time 513 seconds  Glucose, capillary     Status: Abnormal   Collection Time: 02/05/16  8:55 AM  Result Value Ref Range   Glucose-Capillary 162 (H) 65 - 99 mg/dL  Glucose, capillary     Status: Abnormal   Collection Time: 02/05/16 11:39 AM  Result Value Ref Range   Glucose-Capillary 178 (H) 65 - 99 mg/dL    Intake/Output Summary (Last 24 hours)  at 02/05/16 1736 Last data filed at 02/05/16 1300  Gross per 24 hour  Intake 2288.59 ml  Output      0 ml  Net 2288.59 ml      ASSESSMENT AND PLAN:  Principal Problem:   NSTEMI (non-ST elevated myocardial infarction) (HCC) Active Problems:   Hypertension   Hyperlipidemia   Aortic stenosis, mild   Hypothyroid   Diabetes mellitus with stage 3 chronic kidney disease (HCC)   # CAD, NSTEMI s/p DES to RCA:  Ms. Delisi is doing well after PCI.  She has no residual chest pain.  She has a 90% OM2 lesion that will be addressed in 1-2 weeks as an outpatient.  Continue aspirin, ticagrelor, atorvastatin, carvedilol.  ACE-I/ARB held due to CKD.  # CKD: Creatinine improving to 1.76 today.  No ACE-I/ARB due to acute on chronic kidney disease.  # Mild AS: Mean gradient 10 mmHg on echo this admission.  Continue to monitor as an outpatient.   # Hypertension: Blood pressure well-controlled on carvedilol and hydralazine.  # Hyperlipidemia: Atorvastatin 80 mg started this admission.  She has not tolerated rosuvastatin in the past.  Consider PCSK9 if she does not tolerate this.   Time spent: 25 minutes-Greater than 50% of this time was spent in counseling,  explanation of diagnosis, planning of further management, and coordination of care.    Kalon Erhardt C. Duke Salvia, MD, [facility] 02/05/2016 5:36 PM

## 2016-02-06 ENCOUNTER — Telehealth: Payer: Self-pay | Admitting: Cardiology

## 2016-02-06 ENCOUNTER — Telehealth: Payer: Self-pay | Admitting: Physician Assistant

## 2016-02-06 ENCOUNTER — Encounter (HOSPITAL_COMMUNITY): Payer: Self-pay | Admitting: Cardiology

## 2016-02-06 DIAGNOSIS — N183 Chronic kidney disease, stage 3 unspecified: Secondary | ICD-10-CM | POA: Diagnosis present

## 2016-02-06 DIAGNOSIS — R001 Bradycardia, unspecified: Secondary | ICD-10-CM

## 2016-02-06 DIAGNOSIS — R0683 Snoring: Secondary | ICD-10-CM

## 2016-02-06 DIAGNOSIS — D649 Anemia, unspecified: Secondary | ICD-10-CM

## 2016-02-06 DIAGNOSIS — E1122 Type 2 diabetes mellitus with diabetic chronic kidney disease: Secondary | ICD-10-CM

## 2016-02-06 DIAGNOSIS — I119 Hypertensive heart disease without heart failure: Secondary | ICD-10-CM

## 2016-02-06 LAB — GLUCOSE, CAPILLARY: Glucose-Capillary: 152 mg/dL — ABNORMAL HIGH (ref 65–99)

## 2016-02-06 LAB — BASIC METABOLIC PANEL
Anion gap: 9 (ref 5–15)
BUN: 19 mg/dL (ref 6–20)
CALCIUM: 9.5 mg/dL (ref 8.9–10.3)
CO2: 23 mmol/L (ref 22–32)
Chloride: 108 mmol/L (ref 101–111)
Creatinine, Ser: 1.73 mg/dL — ABNORMAL HIGH (ref 0.44–1.00)
GFR calc Af Amer: 35 mL/min — ABNORMAL LOW (ref >60)
GFR, EST NON AFRICAN AMERICAN: 30 mL/min — AB (ref >60)
GLUCOSE: 179 mg/dL — AB (ref 65–99)
Potassium: 4.4 mmol/L (ref 3.5–5.1)
Sodium: 140 mmol/L (ref 135–145)

## 2016-02-06 LAB — CBC
HEMATOCRIT: 31.8 % — AB (ref 36.0–46.0)
Hemoglobin: 9.8 g/dL — ABNORMAL LOW (ref 12.0–15.0)
MCH: 28.4 pg (ref 26.0–34.0)
MCHC: 30.8 g/dL (ref 30.0–36.0)
MCV: 92.2 fL (ref 78.0–100.0)
Platelets: 245 10*3/uL (ref 150–400)
RBC: 3.45 MIL/uL — ABNORMAL LOW (ref 3.87–5.11)
RDW: 15.5 % (ref 11.5–15.5)
WBC: 12 10*3/uL — ABNORMAL HIGH (ref 4.0–10.5)

## 2016-02-06 MED ORDER — CARVEDILOL 25 MG PO TABS: 25.0000 mg | ORAL_TABLET | Freq: Two times a day (BID) | ORAL | Status: DC

## 2016-02-06 MED ORDER — ATORVASTATIN CALCIUM 80 MG PO TABS: 80.0000 mg | ORAL_TABLET | Freq: Every day | ORAL | Status: DC

## 2016-02-06 MED ORDER — INSULIN GLARGINE 100 UNIT/ML SOLOSTAR PEN: 100.0000 [IU] | PEN_INJECTOR | Freq: Every day | SUBCUTANEOUS | Status: DC

## 2016-02-06 MED ORDER — NITROGLYCERIN 0.4 MG SL SUBL: 0.4000 mg | SUBLINGUAL_TABLET | SUBLINGUAL | Status: DC | PRN

## 2016-02-06 MED ORDER — HYDRALAZINE HCL 50 MG PO TABS: 50.0000 mg | ORAL_TABLET | Freq: Three times a day (TID) | ORAL | Status: DC

## 2016-02-06 MED ORDER — PANTOPRAZOLE SODIUM 40 MG PO TBEC: 40.0000 mg | DELAYED_RELEASE_TABLET | Freq: Two times a day (BID) | ORAL | Status: DC

## 2016-02-06 MED ORDER — CLOPIDOGREL BISULFATE 75 MG PO TABS: 75.0000 mg | ORAL_TABLET | Freq: Every day | ORAL | Status: DC

## 2016-02-06 MED FILL — Heparin Sodium (Porcine) 2 Unit/ML in Sodium Chloride 0.9%: INTRAMUSCULAR | Qty: 500 | Status: AC

## 2016-02-06 NOTE — Progress Notes (Signed)
Patient Name: Teresa Hart Date of Encounter: 3Novella Rob/21/2017  Hospital Problem List     Principal Problem:   NSTEMI (non-ST elevated myocardial infarction) [facility](HCC) Active Problems:   Diabetes mellitus with stage 3 chronic kidney disease (HCC)   Hypertensive heart disease   CKD (chronic kidney disease), stage III   Hyperlipidemia   Aortic stenosis, mild   Hypothyroid   Sinus bradycardia   Normocytic anemia   Snoring  Subjective   No chest pain or sob overnight.  R wrist feels good.  Ambulating w/o difficulty.  Eager to go home.  Inpatient Medications    . ARIPiprazole  10 mg Oral Daily  . aspirin EC  81 mg Oral Daily  . atorvastatin  80 mg Oral q1800  . carvedilol  25 mg Oral BID WC  . clopidogrel  75 mg Oral Daily  . DULoxetine  60 mg Oral BID  . fluticasone  2 spray Each Nare QHS  . hydrALAZINE  25 mg Oral 3 times per day  . insulin aspart  0-15 Units Subcutaneous TID WC  . insulin glargine  50 Units Subcutaneous QHS  . loratadine  5 mg Oral QPM  . pantoprazole  40 mg Oral BID  . sodium chloride flush  3 mL Intravenous Q12H  . sodium chloride flush  3 mL Intravenous Q12H  . Vortioxetine HBr  20 mg Oral Daily    Vital Signs    Filed Vitals:   02/05/16 1300 02/05/16 2032 02/06/16 0328 02/06/16 0739  BP: 132/49 173/96 118/52 143/78  Pulse: 73 81 78 73  Temp:  98.6 F (37 C) 99 F (37.2 C) 97.4 F (36.3 C)  TempSrc:  Oral Oral Oral  Resp: 17 30 29 28   Height:      Weight:   216 lb 0.8 oz (98 kg)   SpO2: 100% 98% 98% 95%    Intake/Output Summary (Last 24 hours) at 02/06/16 0824 Last data filed at 02/06/16 0740  Gross per 24 hour  Intake 1483.41 ml  Output   2150 ml  Net -666.59 ml   Filed Weights   02/04/16 0317 02/05/16 0429 02/06/16 0328  Weight: 217 lb 2.5 oz (98.5 kg) 215 lb 2.7 oz (97.6 kg) 216 lb 0.8 oz (98 kg)    Physical Exam    General: Pleasant, NAD. Neuro: Alert and oriented X 3. Moves all extremities spontaneously. Psych: Normal  affect. HEENT:  Normal  Neck: Supple without bruits.  Obese - difficult to gauge jvp. Lungs:  Resp regular and unlabored, CTA. Heart: RRR no s3, s4, or murmurs. Abdomen: Soft, non-tender, non-distended, BS + x 4.  Extremities: No clubbing, cyanosis or edema. DP/PT/Radials 2+ and equal bilaterally. R wrist cath site w/o bleeding/bruit/hematoma.  Labs    CBC  Recent Labs  02/05/16 0215 02/06/16 0319  WBC 9.7 12.0*  HGB 9.8* 9.8*  HCT 31.7* 31.8*  MCV 92.4 92.2  PLT 247 245   Basic Metabolic Panel  Recent Labs  02/05/16 0215 02/06/16 0319  NA 139 140  K 4.5 4.4  CL 106 108  CO2 24 23  GLUCOSE 193* 179*  BUN 22* 19  CREATININE 1.76* 1.73*  CALCIUM 9.3 9.5    Telemetry    Sinus rhythm/sinus brady - up to 2.75 sec pauses during periods of sleep.  ECG    RSR, 78, 1st deg avb, inf twi (new; lat twi no longer present).  Radiology    Dg Chest Port 1 View  02/02/2016  CLINICAL  DATA:  Chest pain EXAM: PORTABLE CHEST 1 VIEW COMPARISON:  01/28/2012 FINDINGS: Cardiopericardial enlargement which is increased from prior. There is no edema, consolidation, effusion, or pneumothorax. No acute osseous finding. IMPRESSION: 1. Cardiopericardial enlargement which has increased from 2013. Pericardial effusion is possible. 2. No edema or pneumonia. Electronically Signed   By: Marnee Spring M.D.   On: 02/02/2016 04:44    Assessment & Plan    1.  NSTEMI/CAD:  S/p PCI/DES of the RCA on 3/20.  She has residual, severe OM2 dzs, which is now felt to be amenable to PCI (prev felt to be too small).  Cont asa, statin,  blocker, plavix.  Plan d/c home today and early f/u w/in the next week and if renal fxn stable  outpt PCI of the OM2.  2.  Hypertensive Heart Disease:  BP variable but generally trending 130's to 140's.  She is on coreg (new - was prev on high dose dilt) and hydralazine - prev on 50 BID, now on 25 tid.  Will increase to 50 TID.  3.  HL:  TC 332, LDL 222.  Nl LFT's.  Prev  intolerant to crestor.  Currently on lipitor 80.  If she does not tolerate and/or adequate LDL lowering not achieved, would have a low threshold to add PCSK9 inhibitor in the future (if financially feasible).  4.  CKD III:  Creat stable post-PCI.  She is not currently on ACEI/ARB though this could be considered in the future in the outpt setting.  F/u creat @ f/u appt.  5. DM:  A1c 7.7.  She is followed by Dr. Haynes Kerns as an outpt.  Cont current insulin regimen.  6.  Morbid Obesity:  Cardiac rehab.  7.  Snoring/nocturnal sinus brady:  Periods of sinus brady w/ pauses of up to 2.75 sec overnight.  She does have a h/o snoring and has been told in the past that she may have OSA.  Has never had a sleep study.  Plan to arrange as an outpt in the near future.  8.  Normocytic anemia:  H/H stable over past 3 days.  Signed, Nicolasa Ducking NP   History and all data above reviewed.  Patient examined.  I agree with the findings as above.  The patient exam reveals:  General: Well-appearing. No acute distress.  Neck: No JVD.  Lungs: CTAB. No crackles, rhonchi or wheezes Heart: RRR. No m/r/g. Normal S1/S2. R radial cath site stable. Abdomen: Soft. Non-tender. Non-distended. +BS Extremities: WWP. No edema.  All available labs, radiology testing, previous records reviewed. Agree with documented assessment and plan. Teresa Hart is doing well and is ready to go home. She is ambulated and denies any chest pain or shortness of breath. She will return for intervention of her OM 2 lesion in one to 2 weeks. She has follow-up arranged. Her blood pressure has been poorly controlled. Carvedilol was increased and hydralazine was switched to 50 mg 3 times a day. She will keep a log of her blood pressures and call if they are consistently over 140/90. Atorvastatin was started this admission. She will need follow-up of her lipids and LFTs within 6-8 weeks.    Kenzo Ozment C. Duke Salvia, MD, [facility]   02/06/2016 12:41 PM

## 2016-02-06 NOTE — Telephone Encounter (Signed)
New message     TOC appt on  3.28.2017 with Rhonda Barrett.  Per Ward Givenshris Berge.

## 2016-02-06 NOTE — Telephone Encounter (Signed)
LMTC-cc

## 2016-02-06 NOTE — Telephone Encounter (Signed)
    [facility] and [facility] trying to contact patient     Nori Riisatherine A Carlton, RN at 02/06/2016 11:54 AM     Status: Signed       Expand All Collapse All   LMTC-cc            Geraldine ContrasStephanie R Smith at 02/06/2016 11:46 AM     Status: Signed       Expand All Collapse All   Dsch 3/21 TOC

## 2016-02-06 NOTE — Discharge Summary (Signed)
Discharge Summary    Patient ID: Teresa Hart,  MRN: [id number], DOB/AGE: 64/02/1951 64 y.o.  Admit date: 02/02/2016 Discharge date: 02/06/2016  Primary Care Provider: Abran RichardBAUCOM, JENNY B Primary Cardiologist: Pt wishes to establish care in our [place] office  Discharge Diagnoses    Principal Problem:   NSTEMI (non-ST elevated myocardial infarction)/Coronary Artery Disease  **s/p PCI/DES of the RCA this admission.  **Residual OM2 dzs with plan for staged PCI.  Active Problems:   Diabetes mellitus with stage 3 chronic kidney disease (HCC)   Hypertensive heart disease   CKD (chronic kidney disease), stage III   Hyperlipidemia   Aortic stenosis, mild   Hypothyroid   Normocytic anemia   Nocturnal Sinus bradycardia   Snoring  **Needs outpt sleep study.  Allergies Allergies  Allergen Reactions  . Amoxicillin-Pot Clavulanate Nausea Only  . Crestor [Rosuvastatin]     Muscle spasms and cramps  . Codeine     REACTION: nausea  . Penicillins     REACTION: major hives,swelling    Diagnostic Studies/Procedures    2D Echocardiogram 3.18.2017  Study Conclusions   - Left ventricle: The cavity size was normal. Wall thickness was   increased in a pattern of moderate LVH. Systolic function was   vigorous. The estimated ejection fraction was in the range of 65%   to 70%. Basal inferior hypokinesis. Doppler parameters are   consistent with abnormal left ventricular relaxation (grade 1   diastolic dysfunction). - Aortic valve: Poorly visualized. There was mild stenosis. Mean   gradient (S): 10 mm Hg. - Mitral valve: There was trivial regurgitation. - Right ventricle: The cavity size was normal. Systolic function   was normal. - Tricuspid valve: Peak RV-RA gradient (S): 15 mm Hg. - Pulmonic valve: Poorly visualized. Mildly elevated peak gradient,   ?mild pulmonic stenosis. Peak gradient (S): 25 mm Hg. - Pulmonary arteries: PA peak pressure: 18 mm Hg (S). - Inferior vena  cava: The vessel was normal in size. The   respirophasic diameter changes were in the normal range (>= 50%),   consistent with normal central venous pressure. - Pericardium, extracardiac: Prominent epicardial adipose tissue.   Impressions:   - Normal LV size with moderate LV hypertrophy. EF 65-70%. Normal RV   size and systolic function. There were elevated gradients across   both aortic and pulmonic valves, suggesting mild stenosis versus   high flow. Neither valve was well-visualized. _____________   Cardiac Catheterization and Percutaneous Coronary Intervention 3.20.2017  Coronary Findings     Dominance: Right    Left Main  Vessel was injected. Vessel is normal in caliber. Vessel is angiographically normal.      Left Anterior Descending   . Ost LAD to Prox LAD lesion, 30% stenosed. Discrete.   . Mid LAD to Dist LAD lesion, 60% stenosed. Discrete.      Left Circumflex   . First Obtuse Marginal Branch   The vessel is small in size.   Marland Kitchen. Second [employer]S. Bancorpbtuse Marginal Branch   . Ost 2nd Mrg to 2nd Mrg lesion, 90% stenosed. Diffuse ulcerative.   . Third Obtuse Marginal Branch   The vessel is small in size.      Right Coronary Artery   . Prox RCA to Mid RCA lesion, 95% stenosed. Diffuse.   Marland Kitchen. PCI: the RCA was successfully stented using a 2.75 x 24 mm Promus Premier DES  . There is no residual stenosis post intervention.     . Mid RCA lesion, 40%  stenosed. Diffuse.    _____________   History of Present Illness     65 year old female with a history of diabetes, hypertension, carotid arterial disease, stage III chronic kidney disease, nonobstructive CAD, who presented to the [facility] emergency department on in the early morning hours of March 17, after she developed chest discomfort while watching TV earlier in the evening. In the ED, ECG was nonacute but initial troponin was mildly elevated. She was treated with aspirin, nitroglycerin, and heparin with improvement in chest discomfort. She  was admitted to the coronary intensive care unit for further evaluation.  Hospital Course     Consultants: None   Ms. Kwasny ruled in for non-ST segment elevation myocardial infarction, eventually peaking her troponin at 9.51. Although it was felt that she would require diagnostic catheterization, this was initially deferred secondary to her history of chronic kidney disease with an admission creatinine of 1.85. Echocardiogram was performed over the weekend and revealed normal LV function with mild aortic stenosis. She was maintained on IV nitroglycerin and IV heparin and did complain of intermittent chest discomfort. On Monday, March 20, she underwent diagnostic catheterization revealing severe stenosis in the proximal and mid right coronary artery as well as severe stenosis in the second obtuse marginal. The RCA was felt to be the culprit vessel and this was successfully treated using a 2.75 x 24 mm Promus Premier drug-eluting stent. Disease in the OM2 was felt to be similar to what was present on catheterization in 2013, however the vessel was now felt to be amenable for PCI. In light of chronic kidney disease, it was felt that she would be best served by medical therapy for the time being with the plan to stage and intervention to the second obtuse marginal provided that renal function is stable at outpatient follow-up.   Post percutaneous intervention, Ms. Malinak has been ambulating without recurrent symptoms or limitations. She has been seen by cardiac rehabilitation and outpatient referral has been made. We have switched her from calcium channel blocker therapy to beta blocker therapy in the setting of acute syndrome and have also initiated high potency statin therapy in the setting of marked did lipid abnormalities with a total cholesterol 332 and an LDL of 222. She does have a prior intolerance to Crestor with significant leg aches and myalgias and if she has recurrent symptoms on Lipitor, we  will likely have to explore using a PCSK9 inhibitor.  Ms. Howatt has also been noted to have intermittent sinus bradycardia during hours of sleep. She does admit to a history of snoring. She has never had a sleep study. We will need to arrange this in the outpatient setting. _____________  Discharge Vitals Blood pressure 182/93, pulse 76, temperature 97 F (36.1 C), temperature source Oral, resp. rate 27, height  (1.651 m), weight 216 lb 0.8 oz (98 kg), SpO2 98 %.  Filed Weights   02/04/16 0317 02/05/16 0429 02/06/16 0328  Weight: 217 lb 2.5 oz (98.5 kg) 215 lb 2.7 oz (97.6 kg) 216 lb 0.8 oz (98 kg)    Labs & Radiologic Studies    CBC  Recent Labs  02/05/16 0215 02/06/16 0319  WBC 9.7 12.0*  HGB 9.8* 9.8*  HCT 31.7* 31.8*  MCV 92.4 92.2  PLT 247 245   Basic Metabolic Panel  Recent Labs  02/05/16 0215 02/06/16 0319  NA 139 140  K 4.5 4.4  CL 106 108  CO2 24 23  GLUCOSE 193* 179*  BUN 22* 19  CREATININE 1.76* 1.73*  CALCIUM 9.3 9.5   Liver Function Tests Lab Results  Component Value Date   ALT 19 02/03/2016   AST 31 02/03/2016   ALKPHOS 88 02/03/2016   BILITOT 0.4 02/03/2016    Cardiac Enzymes Lab Results  Component Value Date   TROPONINI 9.51* 02/02/2016    Hemoglobin A1C Lab Results  Component Value Date   HGBA1C 7.7* 02/02/2016    Fasting Lipid Panel Lab Results  Component Value Date   CHOL 332* 02/03/2016   HDL 48 02/03/2016   LDLCALC 222* 02/03/2016   TRIG 311* 02/03/2016   CHOLHDL 6.9 02/03/2016    Thyroid Function Tests Lab Results  Component Value Date   TSH 3.918 02/02/2016   _____________   Dg Chest Port 1 View  02/02/2016  CLINICAL DATA:  Chest pain EXAM: PORTABLE CHEST 1 VIEW COMPARISON:  01/28/2012 FINDINGS: Cardiopericardial enlargement which is increased from prior. There is no edema, consolidation, effusion, or pneumothorax. No acute osseous finding. IMPRESSION: 1. Cardiopericardial enlargement which has increased from  2013. Pericardial effusion is possible. 2. No edema or pneumonia. Electronically Signed   By: Marnee Spring M.D.   On: 02/02/2016 04:44  _____________  Disposition   Pt is being discharged home today in good condition.  Follow-up Plans & Appointments    Follow-up Information    Follow up with Romero Belling, MD.   Specialty:  Endocrinology   Why:  as scheduled.   Contact information:   [address] [place] [address] [phone]       Follow up with Jacolyn Reedy, PA-C On 02/13/2016.   Specialty:  Cardiology   Why:  1:00 PM   Contact information:   [address] [place] [address] [phone]      Discharge Instructions    AMB Referral to Cardiac Rehabilitation - Phase II    Complete by:  As directed   Diagnosis:  Myocardial Infarction     Amb Referral to Cardiac Rehabilitation    Complete by:  As directed   Diagnosis:   PCI Myocardial Infarction             Discharge Medications   Current Discharge Medication List    START taking these medications   Details  atorvastatin (LIPITOR) 80 MG tablet Take 1 tablet (80 mg total) by mouth daily at 6 PM. Qty: 30 tablet, Refills: 6    carvedilol (COREG) 25 MG tablet Take 1 tablet (25 mg total) by mouth 2 (two) times daily with a meal. Qty: 60 tablet, Refills: 6    clopidogrel (PLAVIX) 75 MG tablet Take 1 tablet (75 mg total) by mouth daily. Qty: 30 tablet, Refills: 6    pantoprazole (PROTONIX) 40 MG tablet Take 1 tablet (40 mg total) by mouth 2 (two) times daily. Qty: 30 tablet, Refills: 6      CONTINUE these medications which have CHANGED   Details  hydrALAZINE (APRESOLINE) 50 MG tablet Take 1 tablet (50 mg total) by mouth 3 (three) times daily. Qty: 90 tablet, Refills: 6    Insulin Glargine (LANTUS SOLOSTAR) 100 UNIT/ML Solostar Pen Inject 100 Units into the skin at bedtime. INJECT 180 UNITS DAILY AS DIRECTED. Qty: 30 mL, Refills: 6    nitroGLYCERIN (NITROSTAT) 0.4 MG SL tablet Place  1 tablet (0.4 mg total) under the tongue every 5 (five) minutes as needed for chest pain. Qty: 25 tablet, Refills: 3      CONTINUE these medications which have NOT CHANGED  Details  albuterol (PROVENTIL HFA;VENTOLIN HFA) 108 (90 BASE) MCG/ACT inhaler Inhale 2 puffs into the lungs every 6 (six) hours as needed. Qty: 18 g, Refills: 2   Associated Diagnoses: Asthma    ARIPiprazole (ABILIFY) 20 MG tablet Take 10 mg by mouth every evening.     aspirin EC 81 MG tablet Take 1 tablet (81 mg total) by mouth daily. Qty: 150 tablet, Refills: 2    diazepam (VALIUM) 5 MG tablet Take 5 mg by mouth every 6 (six) hours as needed for anxiety.    HUMALOG KWIKPEN 100 UNIT/ML SOPN INJECT 20 UNITS S.Q. THREE TIMES A DAY WITH MEALS. Qty: 15 mL, Refills: 5    levocetirizine (XYZAL) 5 MG tablet Take 1 tablet (5 mg total) by mouth every evening. Qty: 30 tablet, Refills: 5    Liraglutide (VICTOZA) 18 MG/3ML SOPN Inject 1.8 Units into the skin at bedtime.     LORazepam (ATIVAN) 1 MG tablet Take 1 mg by mouth as needed for anxiety or sleep.     Vitamin D, Ergocalciferol, (DRISDOL) 50000 units CAPS capsule Take 1 capsule (50,000 Units total) by mouth every 7 (seven) days. Qty: 12 capsule, Refills: 0    Vortioxetine HBr (TRINTELLIX) 20 MG TABS Take 20 mg by mouth daily.       STOP taking these medications     omeprazole (PRILOSEC) 20 MG capsule      TAZTIA XT 360 MG 24 hr capsule      DULoxetine (CYMBALTA) 60 MG capsule      fluticasone (FLONASE) 50 MCG/ACT nasal spray          Aspirin prescribed at discharge?  Yes High Intensity Statin Prescribed? (Lipitor 40-80mg  or Crestor 20-40mg ): Yes Beta Blocker Prescribed? Yes For EF 45% or less, Was ACEI/ARB Prescribed? No: N/A ADP Receptor Inhibitor Prescribed? (i.e. Plavix etc.-Includes Medically Managed Patients): Yes For EF <40%, Aldosterone Inhibitor Prescribed? No: N/A Was EF assessed during THIS hospitalization? Yes Was Cardiac Rehab II  ordered? (Included Medically managed Patients): Yes   Outstanding Labs/Studies   BMET @ f/u appt. Staged PCI of the OM2 if stable at follow-up. Outpt Sleep study will need to be scheduled.  Duration of Discharge Encounter   Greater than 30 minutes including physician time.  Signed, Nicolasa Ducking NP 02/06/2016, 11:44 AM

## 2016-02-06 NOTE — Telephone Encounter (Signed)
Dsch 3/21  TOC

## 2016-02-06 NOTE — Care Management Note (Addendum)
Case Management Note  Patient Details  Name: Teresa Hart MRN: [id number] Date of Birth: 12/30/1950  Subjective/Objective:          s/p PCI/DES of the RCA this admission          Action/Plan: Discharge Planning: AVS reviewed:  NCM spoke to pt at bedside. Brilinta was discontinued. Dc home on Plavix. Pt states she can afford her medications at home. She is on an assistance program that helps her pay copay cost. States she does not remember the name of program. Husband, Onalee HuaDavid at home to assist with her care. No DME needed for ambulation.   Luther BradleyPCP-BAUCOM, JENNY B MD  S/W DAVID @ [employer] # [phone]   BRILINTA 90 MG 60 TABS   COVER- YES  CO-PAY- $ 8.25  AND MAIL-ORDER 90 DAY- $ 8.25  TIER- 3  PRIOR APPROVAL - NO  PHARMACY - ANY RETAIL  Expected Discharge Date:  02/06/2016               Expected Discharge Plan:  Home/Self Care  In-House Referral:  NA  Discharge planning Services  CM Consult  Post Acute Care Choice:  NA Choice offered to:  NA  DME Arranged:  N/A DME Agency:  NA  HH Arranged:  NA HH Agency:  NA  Status of Service:  Completed, signed off  Medicare Important Message Given:    Date Medicare IM Given:    Medicare IM give by:    Date Additional Medicare IM Given:    Additional Medicare Important Message give by:     If discussed at Long Length of Stay Meetings, dates discussed:    Additional Comments:  Teresa Hart, Teresa Gandolfo Ellen, RN 02/06/2016, 12:00 PM

## 2016-02-06 NOTE — Discharge Instructions (Addendum)
**  PLEASE REMEMBER TO BRING ALL OF YOUR MEDICATIONS TO EACH OF YOUR FOLLOW-UP OFFICE VISITS.  NO HEAVY LIFTING X 2 WEEKS. NO SEXUAL ACTIVITY X 2 WEEKS. NO DRIVING X 1 WEEK. NO SOAKING BATHS, HOT TUBS, POOLS, ETC., X 7 DAYS.  Radial Site Care Refer to this sheet in the next few weeks. These instructions provide you with information on caring for yourself after your procedure. Your caregiver may also give you more specific instructions. Your treatment has been planned according to current medical practices, but problems sometimes occur. Call your caregiver if you have any problems or questions after your procedure. HOME CARE INSTRUCTIONS  You may shower the day after the procedure.Remove the bandage (dressing) and gently wash the site with plain soap and water.Gently pat the site dry.   Do not apply powder or lotion to the site.   Do not submerge the affected site in water for 3 to 5 days.   Inspect the site at least twice daily.   Do not flex or bend the affected arm for 24 hours.   No lifting over 5 pounds (2.3 kg) for 5 days after your procedure.   What to expect:  Any bruising will usually fade within 1 to 2 weeks.   Blood that collects in the tissue (hematoma) may be painful to the touch. It should usually decrease in size and tenderness within 1 to 2 weeks.  SEEK IMMEDIATE MEDICAL CARE IF:  You have unusual pain at the radial site.   You have redness, warmth, swelling, or pain at the radial site.   You have drainage (other than a small amount of blood on the dressing).   You have chills.   You have a fever or persistent symptoms for more than 72 hours.   You have a fever and your symptoms suddenly get worse.   Your arm becomes pale, cool, tingly, or numb.   You have heavy bleeding from the site. Hold pressure on the site.   MEDICATIONS TO STOP TAKING:  PRILOSEC (OMEPRAZOLE) TAZTIA XT

## 2016-02-06 NOTE — Progress Notes (Signed)
CARDIAC REHAB PHASE I   PRE:  Rate/Rhythm: 70 SR    BP: sitting 135/81    SaO2:   MODE:  Ambulation: 500 ft   POST:  Rate/Rhythm: 85 SR    BP: sitting 153/71     SaO2:   Tolerated well, no c/o. Eager for ed and CRPII which she [facility]. Understands importance of Plavix/ASA.  [phone]   Harriet MassonRandi Kristan Dreyton Roessner CES, ACSM 02/06/2016 9:45 AM

## 2016-02-06 NOTE — Care Management Important Message (Signed)
Important Message  Patient Details  Name: Teresa Hart MRN: [id number] Date of Birth: 07/14/1951   Medicare Important Message Given:  Yes    Ayaka Andes P Denisia Harpole 02/06/2016, 12:04 PM

## 2016-02-08 NOTE — Telephone Encounter (Signed)
3/23 went to voicemail-cc

## 2016-02-13 ENCOUNTER — Other Ambulatory Visit (HOSPITAL_COMMUNITY)
Admission: RE | Admit: 2016-02-13 | Discharge: 2016-02-13 | Disposition: A | Discharge status: Home / Self Care | Payer: Medicare Other | Source: Ambulatory Visit | Attending: Physician Assistant | Admitting: Physician Assistant

## 2016-02-13 ENCOUNTER — Ambulatory Visit: Payer: Medicare Other | Admitting: Physician Assistant

## 2016-02-13 ENCOUNTER — Ambulatory Visit (INDEPENDENT_AMBULATORY_CARE_PROVIDER_SITE_OTHER): Payer: Medicare Other | Admitting: Physician Assistant

## 2016-02-13 ENCOUNTER — Encounter: Payer: Self-pay | Admitting: Physician Assistant

## 2016-02-13 ENCOUNTER — Encounter: Payer: Self-pay | Admitting: *Deleted

## 2016-02-13 DIAGNOSIS — R001 Bradycardia, unspecified: Secondary | ICD-10-CM | POA: Insufficient documentation

## 2016-02-13 DIAGNOSIS — I10 Essential (primary) hypertension: Secondary | ICD-10-CM

## 2016-02-13 DIAGNOSIS — N183 Chronic kidney disease, stage 3 (moderate): Secondary | ICD-10-CM | POA: Diagnosis not present

## 2016-02-13 DIAGNOSIS — I2510 Atherosclerotic heart disease of native coronary artery without angina pectoris: Secondary | ICD-10-CM | POA: Insufficient documentation

## 2016-02-13 DIAGNOSIS — I129 Hypertensive chronic kidney disease with stage 1 through stage 4 chronic kidney disease, or unspecified chronic kidney disease: Secondary | ICD-10-CM | POA: Insufficient documentation

## 2016-02-13 DIAGNOSIS — R0989 Other specified symptoms and signs involving the circulatory and respiratory systems: Secondary | ICD-10-CM | POA: Insufficient documentation

## 2016-02-13 LAB — PROTIME-INR
INR: 1 (ref 0.00–1.49)
Prothrombin Time: 13.4 seconds (ref 11.6–15.2)

## 2016-02-13 LAB — CBC WITH DIFFERENTIAL/PLATELET
Basophils Absolute: 0 10*3/uL (ref 0.0–0.1)
Basophils Relative: 0 %
EOS ABS: 0.2 10*3/uL (ref 0.0–0.7)
Eosinophils Relative: 2 %
HEMATOCRIT: 30.7 % — AB (ref 36.0–46.0)
HEMOGLOBIN: 9.8 g/dL — AB (ref 12.0–15.0)
LYMPHS ABS: 1.7 10*3/uL (ref 0.7–4.0)
LYMPHS PCT: 16 %
MCH: 29.5 pg (ref 26.0–34.0)
MCHC: 31.9 g/dL (ref 30.0–36.0)
MCV: 92.5 fL (ref 78.0–100.0)
MONOS PCT: 7 %
Monocytes Absolute: 0.8 10*3/uL (ref 0.1–1.0)
NEUTROS ABS: 8 10*3/uL — AB (ref 1.7–7.7)
NEUTROS PCT: 75 %
Platelets: 323 10*3/uL (ref 150–400)
RBC: 3.32 MIL/uL — AB (ref 3.87–5.11)
RDW: 15.3 % (ref 11.5–15.5)
WBC: 10.7 10*3/uL — ABNORMAL HIGH (ref 4.0–10.5)

## 2016-02-13 LAB — BASIC METABOLIC PANEL
Anion gap: 7 (ref 5–15)
BUN: 15 mg/dL (ref 6–20)
CHLORIDE: 107 mmol/L (ref 101–111)
CO2: 25 mmol/L (ref 22–32)
CREATININE: 1.57 mg/dL — AB (ref 0.44–1.00)
Calcium: 9.1 mg/dL (ref 8.9–10.3)
GFR calc non Af Amer: 34 mL/min — ABNORMAL LOW (ref >60)
GFR, EST AFRICAN AMERICAN: 39 mL/min — AB (ref >60)
Glucose, Bld: 86 mg/dL (ref 65–99)
POTASSIUM: 4.2 mmol/L (ref 3.5–5.1)
Sodium: 139 mmol/L (ref 135–145)

## 2016-02-13 NOTE — Progress Notes (Addendum)
Cardiology Office Note   Date:  02/13/2016   ID:  Teresa Hart, DOB 09-30-1951, MRN [id number]  PCP:  Abran Richard, PA-C  Cardiologist:  Previous Dr. Jens Som wants to be established in [place]  Chief Complaint: Post hospital    History of Present Illness: Teresa Hart is a 65 y.o. female who presents for post hospital follow-up. She was admitted to  with a non-ST elevation MI and peak troponin of 9.51. 2-D echo showed normal LV function and mild aortic stenosis. She was hydrated because of creatinine of 1.85 and underwent cath the following Monday. She had a drug-eluting stent to the RCA placed. She has disease in the OM 2 that was felt to be similar to what was present on cath in 2013 however the vessel was now felt to be amenable for PCI. In light of her kidney disease it was felt she should be brought back as an outpatient after renal function is stable. She also has intermittent sinus bradycardia during her sleeping hours and has a history of snoring. Outpatient sleep study was also recommended.  Patient's creatinine was 1.85 02/02/16, 1.51 on 02/03/16, 1.91, [place] 1.73 at discharge on 02/06/16.  Patient comes in today feeling well. She is fatigued. She denies any chest pain, palpitations, dyspnea, dizziness or presyncope. She has chronic COPD and dyspnea on exertion.    Past Medical History  Diagnosis Date  . Hypertensive heart disease   . Hyperlipidemia   . GERD (gastroesophageal reflux disease)   . Obese   . Type I diabetes mellitus (HCC)   . Hypothyroid   . Asthma   . Cardiac enlargement   . Aortic stenosis, mild     a. per echo Jan 2013; EF is 61%  . Carotid stenosis, bilateral     a. per doppler Jan 2013; 60-79% RICA, 40-59% LICA  . CAD (coronary artery disease)     a. 11/2011 abnl MV; b. 11/2011 Cath: No obstructive disease in the large caliber vessels, disease in the small OM2 and distal LAD, not amenable to PCI or grafting; c. 01/2016 PCI: LM  nl, LAD 30ost, 66m, LCX nl, OM1 small, OM2 90 diff, OM3 small, RCA  95p/m 2.75 x 24 Promus Premier DES, 45m. Plan staged PCI of OM2.  . CKD (chronic kidney disease), stage III     Past Surgical History  Procedure Laterality Date  . Abdominal hysterectomy    . Tonsillectomy    . Knee surgery    . Appendectomy    . Cardiac catheterization  Jan 2013    No major obstructive disease in the large caliber vessels, with disease in a small OM2 and distal LAD; not amenable to PCI or grafting.   . Knee arthroscopy  06/24/2012    Procedure: ARTHROSCOPY KNEE;  Surgeon: Nestor Lewandowsky, MD;  Location: [facility];  Service: Orthopedics;  Laterality: Right;  DEBRIDEMENT OF CHONDROMALACIA  . Cardiac catheterization N/A 02/05/2016    Procedure: Left Heart Cath and Coronary Angiography;  Surgeon: Peter M Swaziland, MD;  Location: [facility] INVASIVE CV LAB;  Service: Cardiovascular;  Laterality: N/A;  . Cardiac catheterization  02/05/2016    Procedure: Coronary Stent Intervention;  Surgeon: Peter M Swaziland, MD;  Location: [facility] INVASIVE CV LAB;  Service: Cardiovascular;;     Current Outpatient Prescriptions  Medication Sig Dispense Refill  . albuterol (PROVENTIL HFA;VENTOLIN HFA) 108 (90 BASE) MCG/ACT inhaler Inhale 2 puffs into the lungs every 6 (six) hours as needed. 18 g  2  . ARIPiprazole (ABILIFY) 20 MG tablet Take 10 mg by mouth every evening.     Marland Kitchen. aspirin EC 81 MG tablet Take 1 tablet (81 mg total) by mouth daily. 150 tablet 2  . atorvastatin (LIPITOR) 80 MG tablet Take 1 tablet (80 mg total) by mouth daily at 6 PM. 30 tablet 6  . carvedilol (COREG) 25 MG tablet Take 1 tablet (25 mg total) by mouth 2 (two) times daily with a meal. 60 tablet 6  . clopidogrel (PLAVIX) 75 MG tablet Take 1 tablet (75 mg total) by mouth daily. 30 tablet 6  . diazepam (VALIUM) 5 MG tablet Take 5 mg by mouth every 6 (six) hours as needed for anxiety.    Marland Kitchen. HUMALOG KWIKPEN 100 UNIT/ML SOPN INJECT 20 UNITS S.Q. THREE TIMES A DAY  WITH MEALS. 15 mL 5  . hydrALAZINE (APRESOLINE) 50 MG tablet Take 1 tablet (50 mg total) by mouth 3 (three) times daily. 90 tablet 6  . Insulin Glargine (LANTUS SOLOSTAR) 100 UNIT/ML Solostar Pen Inject 100 Units into the skin at bedtime. INJECT 180 UNITS DAILY AS DIRECTED. 30 mL 6  . levocetirizine (XYZAL) 5 MG tablet Take 1 tablet (5 mg total) by mouth every evening. 30 tablet 5  . Liraglutide (VICTOZA) 18 MG/3ML SOPN Inject 1.8 Units into the skin at bedtime.     Marland Kitchen. LORazepam (ATIVAN) 1 MG tablet Take 1 mg by mouth as needed for anxiety or sleep.     . nitroGLYCERIN (NITROSTAT) 0.4 MG SL tablet Place 1 tablet (0.4 mg total) under the tongue every 5 (five) minutes as needed for chest pain. 25 tablet 3  . pantoprazole (PROTONIX) 40 MG tablet Take 1 tablet (40 mg total) by mouth 2 (two) times daily. 30 tablet 6  . Vitamin D, Ergocalciferol, (DRISDOL) 50000 units CAPS capsule Take 1 capsule (50,000 Units total) by mouth every 7 (seven) days. 12 capsule 0  . Vortioxetine HBr (TRINTELLIX) 20 MG TABS Take 20 mg by mouth daily.      No current facility-administered medications for this visit.    Allergies:   Amoxicillin-pot clavulanate; Crestor; Codeine; and Penicillins    Social History:  The patient  reports that she has never smoked. She has never used smokeless tobacco. She reports that she does not drink alcohol or use illicit drugs.   Family History:  The patient's    family history includes Coronary artery disease in her brother and brother; Heart disease in her brother.    ROS:  Please see the history of present illness.   Otherwise, review of systems are positive for none.   All other systems are reviewed and negative.   carotid bruits, or masses Cardiac: RRR; 1/6 systolic murmur at the left sternal border, no gallop, rubs, thrill or heave,  Respiratory:  clear to auscultation bilaterally, normal work of breathing GI: soft, nontender, nondistended, + BS MS: no deformity or  atrophy Extremities: Right arm at cath site without hematoma or hemorrhage, good radial and brachial pulses, lower extremities without cyanosis, clubbing, edema, good distal pulses bilaterally.  Skin: warm and dry, no rash Neuro:  Strength and sensation are intact    EKG:  EKG is ordered today. The ekg ordered today demonstrates normal sinus rhythm, no acute change   Recent Labs: 02/02/2016: B Natriuretic Peptide 63.0; TSH 3.918 02/03/2016: ALT 19 02/06/2016: BUN 19; Creatinine, Ser 1.73*; Hemoglobin 9.8*; Platelets 245; Potassium 4.4; Sodium 140    Lipid Panel    Component Value  Date/Time   CHOL 332* 02/03/2016 0317   TRIG 311* 02/03/2016 0317   HDL 48 02/03/2016 0317   CHOLHDL 6.9 02/03/2016 0317   VLDL 62* 02/03/2016 0317   LDLCALC 222* 02/03/2016 0317      Wt Readings from Last 3 Encounters:  02/13/16 216 lb 6.4 oz (98.158 kg)  02/06/16 216 lb 0.8 oz (98 kg)  11/30/15 216 lb (97.977 kg)      Other studies Reviewed: Additional studies/ records that were reviewed today include and review of the records demonstrates:   2D Echocardiogram 3.18.2017  Study Conclusions   - Left ventricle: The cavity size was normal. Wall thickness was   increased in a pattern of moderate LVH. Systolic function was   vigorous. The estimated ejection fraction was in the range of 65%   to 70%. Basal inferior hypokinesis. Doppler parameters are   consistent with abnormal left ventricular relaxation (grade 1   diastolic dysfunction). - Aortic valve: Poorly visualized. There was mild stenosis. Mean   gradient (S): 10 mm Hg. - Mitral valve: There was trivial regurgitation. - Right ventricle: The cavity size was normal. Systolic function   was normal. - Tricuspid valve: Peak RV-RA gradient (S): 15 mm Hg. - Pulmonic valve: Poorly visualized. Mildly elevated peak gradient,   ?mild pulmonic stenosis. Peak gradient (S): 25 mm Hg. - Pulmonary arteries: PA peak pressure: 18 mm Hg (S). - Inferior  vena cava: The vessel was normal in size. The   respirophasic diameter changes were in the normal range (>= 50%),   consistent with normal central venous pressure. - Pericardium, extracardiac: Prominent epicardial adipose tissue.   Impressions:   - Normal LV size with moderate LV hypertrophy. EF 65-70%. Normal RV   size and systolic function. There were elevated gradients across   both aortic and pulmonic valves, suggesting mild stenosis versus   high flow. Neither valve was well-visualized. _____________    Cardiac Catheterization and Percutaneous Coronary Intervention 3.20.2017    Coronary Findings       Dominance: Right      Left Main   Vessel was injected. Vessel is normal in caliber. Vessel is angiographically normal.         Left Anterior Descending     .  Ost LAD to Prox LAD lesion, 30% stenosed. Discrete.     .  Mid LAD to Dist LAD lesion, 60% stenosed. Discrete.         Left Circumflex     .  First Obtuse Marginal Branch     The vessel is small in size.     Marland Kitchen.  Second [employer] Marginal Branch     .  Ost 2nd Mrg to 2nd Mrg lesion, 90% stenosed. Diffuse ulcerative.     .  Third Obtuse Marginal Branch     The vessel is small in size.         Right Coronary Artery     .  Prox RCA to Mid RCA lesion, 95% stenosed. Diffuse.     Marland Kitchen.  PCI: the RCA was successfully stented using a 2.75 x 24 mm Promus Premier DES   .  There is no residual stenosis post intervention.        .  Mid RCA lesion, 40% stenosed. Diffuse.         ASSESSMENT AND PLAN:  CAD (coronary artery disease) Patient had DES to the RCA. Because of kidney disease intervention on the obtuse marginal was not performed and to be scheduled  as an outpatient. We will check the patient's renal function today. If it has come down we will scheduled a cardiac catheterization for next week. Patient is agreeable to proceed. I have reviewed the risks, indications, and alternatives to angioplasty and stenting with the patient.  Risks include but are not limited to bleeding, infection, vascular injury, stroke, myocardial infection, arrhythmia, kidney injury, radiation-related injury in the case of prolonged fluoroscopy use, emergency cardiac surgery, and death. The patient understands the risks of serious complication is low (<1%) and he agrees to proceed.     Hypertension Blood pressure stable  Nocturnal Sinus bradycardia Will need outpatient sleep study after her next heart catheterization  Bilateral carotid bruits Check carotid Dopplers last ones done in 2013  CKD (chronic kidney disease), stage III Check renal function today and schedule for staged PCI of the OM if kidney stable    Signed, Jacolyn Reedy, PA-C  02/13/2016 1:31 PM    [facility] [address] [place], [place], [place]  [address] Phone: [phone]; Fax: [phone]

## 2016-02-13 NOTE — Patient Instructions (Signed)
Your physician has requested that you have a cardiac catheterization. Cardiac catheterization is used to diagnose and/or treat various heart conditions. Doctors may recommend this procedure for a number of different reasons. The most common reason is to evaluate chest pain. Chest pain can be a symptom of coronary artery disease (CAD), and cardiac catheterization can show whether plaque is narrowing or blocking your heart's arteries. This procedure is also used to evaluate the valves, as well as measure the blood flow and oxygen levels in different parts of your heart. For further information please visit [email or web address] Please follow instruction sheet, as given.  Your physician recommends that you have lab work done today.  Your physician has requested that you have a carotid duplex. This test is an ultrasound of the carotid arteries in your neck. It looks at blood flow through these arteries that supply the brain with blood. Allow one hour for this exam. There are no restrictions or special instructions.   If you need a refill on your cardiac medications before your next appointment, please call your pharmacy.  Thank you for choosing [facility]!

## 2016-02-13 NOTE — Assessment & Plan Note (Signed)
Check renal function today and schedule for staged PCI of the OM if kidney stable

## 2016-02-13 NOTE — Assessment & Plan Note (Signed)
Patient had DES to the RCA. Because of kidney disease intervention on the obtuse marginal was not performed and to be scheduled as an outpatient. We will check the patient's renal function today. If it has come down we will scheduled a cardiac catheterization for next week. Patient is agreeable to proceed. I have reviewed the risks, indications, and alternatives to angioplasty and stenting with the patient. Risks include but are not limited to bleeding, infection, vascular injury, stroke, myocardial infection, arrhythmia, kidney injury, radiation-related injury in the case of prolonged fluoroscopy use, emergency cardiac surgery, and death. The patient understands the risks of serious complication is low (<1%) and he agrees to proceed.

## 2016-02-13 NOTE — Assessment & Plan Note (Signed)
Will need outpatient sleep study after her next heart catheterization

## 2016-02-13 NOTE — Assessment & Plan Note (Signed)
Check carotid Dopplers last ones done in 2013

## 2016-02-13 NOTE — Telephone Encounter (Signed)
Pt seen by Jacolyn ReedyMichele Lenze, PA-C on 02/13/16

## 2016-02-13 NOTE — Assessment & Plan Note (Signed)
Blood pressure stable ? ?

## 2016-02-14 ENCOUNTER — Telehealth: Payer: Self-pay | Admitting: *Deleted

## 2016-02-14 NOTE — Telephone Encounter (Signed)
Called patient with test results. No answer. Left message to call back.  

## 2016-02-14 NOTE — Telephone Encounter (Signed)
-----   Message from Dyann KiefMichele M Lenze, PA-C sent at 02/14/2016 12:33 PM EDT ----- Labs stable.Kidney function improved and should be ok for cath.

## 2016-02-15 ENCOUNTER — Ambulatory Visit (HOSPITAL_COMMUNITY)
Admission: RE | Admit: 2016-02-15 | Discharge: 2016-02-15 | Disposition: A | Discharge status: Home / Self Care | Payer: Medicare Other | Source: Ambulatory Visit | Attending: Physician Assistant | Admitting: Physician Assistant

## 2016-02-15 DIAGNOSIS — I6523 Occlusion and stenosis of bilateral carotid arteries: Secondary | ICD-10-CM | POA: Insufficient documentation

## 2016-02-15 DIAGNOSIS — I2510 Atherosclerotic heart disease of native coronary artery without angina pectoris: Secondary | ICD-10-CM | POA: Diagnosis not present

## 2016-02-15 DIAGNOSIS — R0989 Other specified symptoms and signs involving the circulatory and respiratory systems: Secondary | ICD-10-CM | POA: Diagnosis present

## 2016-02-19 ENCOUNTER — Telehealth: Payer: Self-pay | Admitting: Interventional Cardiology

## 2016-02-19 NOTE — Telephone Encounter (Signed)
Follow up:    Pt returning call,she did not know who called.

## 2016-02-19 NOTE — Telephone Encounter (Signed)
Informed pt of carotid results. Pt verbalized understanding.  

## 2016-02-19 NOTE — Telephone Encounter (Signed)
Follow Up:   Returning call from this morning,said she did not know who called.

## 2016-02-20 ENCOUNTER — Encounter (HOSPITAL_COMMUNITY)
Admission: RE | Disposition: A | Discharge status: Home / Self Care | Payer: Self-pay | Source: Ambulatory Visit | Attending: Interventional Cardiology

## 2016-02-20 ENCOUNTER — Ambulatory Visit (HOSPITAL_COMMUNITY)
Admission: RE | Admit: 2016-02-20 | Discharge: 2016-02-20 | Disposition: A | Discharge status: Home / Self Care | Payer: Medicare Other | Source: Ambulatory Visit | Attending: Interventional Cardiology | Admitting: Interventional Cardiology

## 2016-02-20 DIAGNOSIS — J449 Chronic obstructive pulmonary disease, unspecified: Secondary | ICD-10-CM | POA: Insufficient documentation

## 2016-02-20 DIAGNOSIS — E785 Hyperlipidemia, unspecified: Secondary | ICD-10-CM | POA: Diagnosis not present

## 2016-02-20 DIAGNOSIS — K219 Gastro-esophageal reflux disease without esophagitis: Secondary | ICD-10-CM | POA: Insufficient documentation

## 2016-02-20 DIAGNOSIS — Z8249 Family history of ischemic heart disease and other diseases of the circulatory system: Secondary | ICD-10-CM | POA: Insufficient documentation

## 2016-02-20 DIAGNOSIS — E1022 Type 1 diabetes mellitus with diabetic chronic kidney disease: Secondary | ICD-10-CM | POA: Insufficient documentation

## 2016-02-20 DIAGNOSIS — I214 Non-ST elevation (NSTEMI) myocardial infarction: Secondary | ICD-10-CM | POA: Diagnosis present

## 2016-02-20 DIAGNOSIS — Z955 Presence of coronary angioplasty implant and graft: Secondary | ICD-10-CM | POA: Diagnosis not present

## 2016-02-20 DIAGNOSIS — I2510 Atherosclerotic heart disease of native coronary artery without angina pectoris: Secondary | ICD-10-CM | POA: Diagnosis present

## 2016-02-20 DIAGNOSIS — I1310 Hypertensive heart and chronic kidney disease without heart failure, with stage 1 through stage 4 chronic kidney disease, or unspecified chronic kidney disease: Secondary | ICD-10-CM | POA: Diagnosis not present

## 2016-02-20 DIAGNOSIS — Z794 Long term (current) use of insulin: Secondary | ICD-10-CM | POA: Insufficient documentation

## 2016-02-20 DIAGNOSIS — I252 Old myocardial infarction: Secondary | ICD-10-CM | POA: Diagnosis not present

## 2016-02-20 DIAGNOSIS — Z7982 Long term (current) use of aspirin: Secondary | ICD-10-CM | POA: Diagnosis not present

## 2016-02-20 DIAGNOSIS — E1049 Type 1 diabetes mellitus with other diabetic neurological complication: Secondary | ICD-10-CM | POA: Diagnosis present

## 2016-02-20 DIAGNOSIS — N183 Chronic kidney disease, stage 3 unspecified: Secondary | ICD-10-CM | POA: Diagnosis present

## 2016-02-20 DIAGNOSIS — E039 Hypothyroidism, unspecified: Secondary | ICD-10-CM | POA: Diagnosis not present

## 2016-02-20 DIAGNOSIS — Z79899 Other long term (current) drug therapy: Secondary | ICD-10-CM | POA: Insufficient documentation

## 2016-02-20 HISTORY — PX: CARDIAC CATHETERIZATION: SHX172

## 2016-02-20 LAB — GLUCOSE, CAPILLARY: Glucose-Capillary: 161 mg/dL — ABNORMAL HIGH (ref 65–99)

## 2016-02-20 SURGERY — CORONARY STENT INTERVENTION

## 2016-02-20 MED ORDER — HEPARIN SODIUM (PORCINE) 1000 UNIT/ML IJ SOLN
INTRAMUSCULAR | Status: DC | PRN
  Administered 2016-02-20: 5000 [IU] via INTRAVENOUS

## 2016-02-20 MED ORDER — SODIUM CHLORIDE 0.9% FLUSH: 3.0000 mL | Freq: Two times a day (BID) | INTRAVENOUS | Status: DC

## 2016-02-20 MED ORDER — FENTANYL CITRATE (PF) 100 MCG/2ML IJ SOLN
INTRAMUSCULAR | Status: DC | PRN
  Administered 2016-02-20: 50 ug via INTRAVENOUS

## 2016-02-20 MED ORDER — ACETAMINOPHEN 325 MG PO TABS: 650.0000 mg | ORAL_TABLET | ORAL | Status: DC | PRN

## 2016-02-20 MED ORDER — LIDOCAINE HCL (PF) 1 % IJ SOLN
INTRAMUSCULAR | Status: DC | PRN
  Administered 2016-02-20: 2 mL

## 2016-02-20 MED ORDER — IOPAMIDOL (ISOVUE-370) INJECTION 76%
INTRAVENOUS | Status: AC
  Filled 2016-02-20: qty 125

## 2016-02-20 MED ORDER — HEPARIN SODIUM (PORCINE) 1000 UNIT/ML IJ SOLN
INTRAMUSCULAR | Status: AC
  Filled 2016-02-20: qty 1

## 2016-02-20 MED ORDER — SODIUM CHLORIDE 0.9 % WEIGHT BASED INFUSION: 1.0000 mL/kg/h | INTRAVENOUS | Status: DC

## 2016-02-20 MED ORDER — MIDAZOLAM HCL 2 MG/2ML IJ SOLN
INTRAMUSCULAR | Status: AC
  Filled 2016-02-20: qty 2

## 2016-02-20 MED ORDER — ONDANSETRON HCL 4 MG/2ML IJ SOLN: 4.0000 mg | Freq: Four times a day (QID) | INTRAMUSCULAR | Status: DC | PRN

## 2016-02-20 MED ORDER — SODIUM CHLORIDE 0.9% FLUSH: 3.0000 mL | INTRAVENOUS | Status: DC | PRN

## 2016-02-20 MED ORDER — IOPAMIDOL (ISOVUE-370) INJECTION 76%
INTRAVENOUS | Status: DC | PRN
  Administered 2016-02-20: 100 mL via INTRAVENOUS

## 2016-02-20 MED ORDER — LIDOCAINE HCL (PF) 1 % IJ SOLN
INTRAMUSCULAR | Status: AC
  Filled 2016-02-20: qty 30

## 2016-02-20 MED ORDER — VERAPAMIL HCL 2.5 MG/ML IV SOLN
INTRAVENOUS | Status: AC
  Filled 2016-02-20: qty 2

## 2016-02-20 MED ORDER — FENTANYL CITRATE (PF) 100 MCG/2ML IJ SOLN
INTRAMUSCULAR | Status: AC
  Filled 2016-02-20: qty 2

## 2016-02-20 MED ORDER — NITROGLYCERIN 1 MG/10 ML FOR IR/CATH LAB
INTRA_ARTERIAL | Status: AC
  Filled 2016-02-20: qty 10

## 2016-02-20 MED ORDER — NITROGLYCERIN 1 MG/10 ML FOR IR/CATH LAB
INTRA_ARTERIAL | Status: DC | PRN
  Administered 2016-02-20: 200 ug via INTRACORONARY

## 2016-02-20 MED ORDER — SODIUM CHLORIDE 0.9 % IV SOLN: 250.0000 mL | INTRAVENOUS | Status: DC | PRN

## 2016-02-20 MED ORDER — MIDAZOLAM HCL 2 MG/2ML IJ SOLN
INTRAMUSCULAR | Status: DC | PRN
  Administered 2016-02-20: 1 mg via INTRAVENOUS

## 2016-02-20 MED ORDER — HEPARIN (PORCINE) IN NACL 2-0.9 UNIT/ML-% IJ SOLN
INTRAMUSCULAR | Status: DC | PRN
  Administered 2016-02-20: 1000 mL

## 2016-02-20 MED ORDER — VERAPAMIL HCL 2.5 MG/ML IV SOLN
INTRAVENOUS | Status: DC | PRN
  Administered 2016-02-20: 10 mL via INTRA_ARTERIAL

## 2016-02-20 MED ORDER — HEPARIN (PORCINE) IN NACL 2-0.9 UNIT/ML-% IJ SOLN
INTRAMUSCULAR | Status: AC
  Filled 2016-02-20: qty 1000

## 2016-02-20 MED ORDER — SODIUM CHLORIDE 0.9 % WEIGHT BASED INFUSION
3.0000 mL/kg/h | INTRAVENOUS | Status: AC
  Administered 2016-02-20: 3 mL/kg/h via INTRAVENOUS

## 2016-02-20 MED ORDER — ASPIRIN 81 MG PO CHEW: 81.0000 mg | CHEWABLE_TABLET | ORAL | Status: DC

## 2016-02-20 SURGICAL SUPPLY — 10 items
CATH INFINITI JR4 5F (CATHETERS) ×4 IMPLANT
CATH VISTA GUIDE 6FR JL3 (CATHETERS) ×4 IMPLANT
CATH VISTA GUIDE 6FR XB3 (CATHETERS) ×4 IMPLANT
GLIDESHEATH SLEND A-KIT 6F 22G (SHEATH) ×4 IMPLANT
KIT HEART LEFT (KITS) ×4 IMPLANT
PACK CARDIAC CATHETERIZATION (CUSTOM PROCEDURE TRAY) ×4 IMPLANT
TRANSDUCER W/STOPCOCK (MISCELLANEOUS) ×4 IMPLANT
TUBING CIL FLEX 10 FLL-RA (TUBING) ×4 IMPLANT
WIRE RUNTHROUGH .014X180CM (WIRE) ×4 IMPLANT
WIRE SAFE-T 1.5MM-J .035X260CM (WIRE) ×4 IMPLANT

## 2016-02-20 NOTE — H&P (View-Only) (Signed)
Cardiology Office Note   Date:  02/13/2016   ID:  TAMRAH Hart, DOB 09-30-1951, MRN [id number]  PCP:  Teresa Richard, PA-C  Cardiologist:  Previous Dr. Jens Hart wants to be established in [place]  Chief Complaint: Post hospital    History of Present Illness: Teresa Hart is a 65 y.o. female who presents for post hospital follow-up. She was admitted to [facility] with a non-ST elevation MI and peak troponin of 9.51. 2-D echo showed normal LV function and mild aortic stenosis. She was hydrated because of creatinine of 1.85 and underwent cath the following Monday. She had a drug-eluting stent to the RCA placed. She has disease in the OM 2 that was felt to be similar to what was present on cath in 2013 however the vessel was now felt to be amenable for PCI. In light of her kidney disease it was felt she should be brought back as an outpatient after renal function is stable. She also has intermittent sinus bradycardia during her sleeping hours and has a history of snoring. Outpatient sleep study was also recommended.  Patient's creatinine was 1.85 02/02/16, 1.51 on 02/03/16, 1.91, [place] 1.73 at discharge on 02/06/16.  Patient comes in today feeling well. She is fatigued. She denies any chest pain, palpitations, dyspnea, dizziness or presyncope. She has chronic COPD and dyspnea on exertion.    Past Medical History  Diagnosis Date  . Hypertensive heart disease   . Hyperlipidemia   . GERD (gastroesophageal reflux disease)   . Obese   . Type I diabetes mellitus (HCC)   . Hypothyroid   . Asthma   . Cardiac enlargement   . Aortic stenosis, mild     a. per echo Jan 2013; EF is 61%  . Carotid stenosis, bilateral     a. per doppler Jan 2013; 60-79% RICA, 40-59% LICA  . CAD (coronary artery disease)     a. 11/2011 abnl MV; b. 11/2011 Cath: No obstructive disease in the large caliber vessels, disease in the small OM2 and distal LAD, not amenable to PCI or grafting; c. 01/2016 PCI: LM  nl, LAD 30ost, 66m, LCX nl, OM1 small, OM2 90 diff, OM3 small, RCA  95p/m 2.75 x 24 Promus Premier DES, 45m. Plan staged PCI of OM2.  . CKD (chronic kidney disease), stage III     Past Surgical History  Procedure Laterality Date  . Abdominal hysterectomy    . Tonsillectomy    . Knee surgery    . Appendectomy    . Cardiac catheterization  Jan 2013    No major obstructive disease in the large caliber vessels, with disease in a small OM2 and distal LAD; not amenable to PCI or grafting.   . Knee arthroscopy  06/24/2012    Procedure: ARTHROSCOPY KNEE;  Surgeon: Nestor Lewandowsky, MD;  Location: [facility];  Service: Orthopedics;  Laterality: Right;  DEBRIDEMENT OF CHONDROMALACIA  . Cardiac catheterization N/A 02/05/2016    Procedure: Left Heart Cath and Coronary Angiography;  Surgeon: Peter M Swaziland, MD;  Location: [facility] INVASIVE CV LAB;  Service: Cardiovascular;  Laterality: N/A;  . Cardiac catheterization  02/05/2016    Procedure: Coronary Stent Intervention;  Surgeon: Peter M Swaziland, MD;  Location: [facility] INVASIVE CV LAB;  Service: Cardiovascular;;     Current Outpatient Prescriptions  Medication Sig Dispense Refill  . albuterol (PROVENTIL HFA;VENTOLIN HFA) 108 (90 BASE) MCG/ACT inhaler Inhale 2 puffs into the lungs every 6 (six) hours as needed. 18 g  2  . ARIPiprazole (ABILIFY) 20 MG tablet Take 10 mg by mouth every evening.     Teresa Hart. aspirin EC 81 MG tablet Take 1 tablet (81 mg total) by mouth daily. 150 tablet 2  . atorvastatin (LIPITOR) 80 MG tablet Take 1 tablet (80 mg total) by mouth daily at 6 PM. 30 tablet 6  . carvedilol (COREG) 25 MG tablet Take 1 tablet (25 mg total) by mouth 2 (two) times daily with a meal. 60 tablet 6  . clopidogrel (PLAVIX) 75 MG tablet Take 1 tablet (75 mg total) by mouth daily. 30 tablet 6  . diazepam (VALIUM) 5 MG tablet Take 5 mg by mouth every 6 (six) hours as needed for anxiety.    Teresa Hart. HUMALOG KWIKPEN 100 UNIT/ML SOPN INJECT 20 UNITS S.Q. THREE TIMES A DAY  WITH MEALS. 15 mL 5  . hydrALAZINE (APRESOLINE) 50 MG tablet Take 1 tablet (50 mg total) by mouth 3 (three) times daily. 90 tablet 6  . Insulin Glargine (LANTUS SOLOSTAR) 100 UNIT/ML Solostar Pen Inject 100 Units into the skin at bedtime. INJECT 180 UNITS DAILY AS DIRECTED. 30 mL 6  . levocetirizine (XYZAL) 5 MG tablet Take 1 tablet (5 mg total) by mouth every evening. 30 tablet 5  . Liraglutide (VICTOZA) 18 MG/3ML SOPN Inject 1.8 Units into the skin at bedtime.     Teresa Hart. LORazepam (ATIVAN) 1 MG tablet Take 1 mg by mouth as needed for anxiety or sleep.     . nitroGLYCERIN (NITROSTAT) 0.4 MG SL tablet Place 1 tablet (0.4 mg total) under the tongue every 5 (five) minutes as needed for chest pain. 25 tablet 3  . pantoprazole (PROTONIX) 40 MG tablet Take 1 tablet (40 mg total) by mouth 2 (two) times daily. 30 tablet 6  . Vitamin D, Ergocalciferol, (DRISDOL) 50000 units CAPS capsule Take 1 capsule (50,000 Units total) by mouth every 7 (seven) days. 12 capsule 0  . Vortioxetine HBr (TRINTELLIX) 20 MG TABS Take 20 mg by mouth daily.      No current facility-administered medications for this visit.    Allergies:   Amoxicillin-pot clavulanate; Crestor; Codeine; and Penicillins    Social History:  The patient  reports that she has never smoked. She has never used smokeless tobacco. She reports that she does not drink alcohol or use illicit drugs.   Family History:  The patient's    family history includes Coronary artery disease in her brother and brother; Heart disease in her brother.    ROS:  Please see the history of present illness.   Otherwise, review of systems are positive for none.   All other systems are reviewed and negative.   carotid bruits, or masses Cardiac: RRR; 1/6 systolic murmur at the left sternal border, no gallop, rubs, thrill or heave,  Respiratory:  clear to auscultation bilaterally, normal work of breathing GI: soft, nontender, nondistended, + BS MS: no deformity or  atrophy Extremities: Right arm at cath site without hematoma or hemorrhage, good radial and brachial pulses, lower extremities without cyanosis, clubbing, edema, good distal pulses bilaterally.  Skin: warm and dry, no rash Neuro:  Strength and sensation are intact    EKG:  EKG is ordered today. The ekg ordered today demonstrates normal sinus rhythm, no acute change   Recent Labs: 02/02/2016: B Natriuretic Peptide 63.0; TSH 3.918 02/03/2016: ALT 19 02/06/2016: BUN 19; Creatinine, Ser 1.73*; Hemoglobin 9.8*; Platelets 245; Potassium 4.4; Sodium 140    Lipid Panel    Component Value  Date/Time   CHOL 332* 02/03/2016 0317   TRIG 311* 02/03/2016 0317   HDL 48 02/03/2016 0317   CHOLHDL 6.9 02/03/2016 0317   VLDL 62* 02/03/2016 0317   LDLCALC 222* 02/03/2016 0317      Wt Readings from Last 3 Encounters:  02/13/16 216 lb 6.4 oz (98.158 kg)  02/06/16 216 lb 0.8 oz (98 kg)  11/30/15 216 lb (97.977 kg)      Other studies Reviewed: Additional studies/ records that were reviewed today include and review of the records demonstrates:   2D Echocardiogram 3.18.2017  Study Conclusions   - Left ventricle: The cavity size was normal. Wall thickness was   increased in a pattern of moderate LVH. Systolic function was   vigorous. The estimated ejection fraction was in the range of 65%   to 70%. Basal inferior hypokinesis. Doppler parameters are   consistent with abnormal left ventricular relaxation (grade 1   diastolic dysfunction). - Aortic valve: Poorly visualized. There was mild stenosis. Mean   gradient (S): 10 mm Hg. - Mitral valve: There was trivial regurgitation. - Right ventricle: The cavity size was normal. Systolic function   was normal. - Tricuspid valve: Peak RV-RA gradient (S): 15 mm Hg. - Pulmonic valve: Poorly visualized. Mildly elevated peak gradient,   ?mild pulmonic stenosis. Peak gradient (S): 25 mm Hg. - Pulmonary arteries: PA peak pressure: 18 mm Hg (S). - Inferior  vena cava: The vessel was normal in size. The   respirophasic diameter changes were in the normal range (>= 50%),   consistent with normal central venous pressure. - Pericardium, extracardiac: Prominent epicardial adipose tissue.   Impressions:   - Normal LV size with moderate LV hypertrophy. EF 65-70%. Normal RV   size and systolic function. There were elevated gradients across   both aortic and pulmonic valves, suggesting mild stenosis versus   high flow. Neither valve was well-visualized. _____________    Cardiac Catheterization and Percutaneous Coronary Intervention 3.20.2017    Coronary Findings       Dominance: Right      Left Main   Vessel was injected. Vessel is normal in caliber. Vessel is angiographically normal.         Left Anterior Descending     .  Ost LAD to Prox LAD lesion, 30% stenosed. Discrete.     .  Mid LAD to Dist LAD lesion, 60% stenosed. Discrete.         Left Circumflex     .  First Obtuse Marginal Branch     The vessel is small in size.     Teresa Hart.  Second [employer] Marginal Branch     .  Ost 2nd Mrg to 2nd Mrg lesion, 90% stenosed. Diffuse ulcerative.     .  Third Obtuse Marginal Branch     The vessel is small in size.         Right Coronary Artery     .  Prox RCA to Mid RCA lesion, 95% stenosed. Diffuse.     Teresa Hart.  PCI: the RCA was successfully stented using a 2.75 x 24 mm Promus Premier DES   .  There is no residual stenosis post intervention.        .  Mid RCA lesion, 40% stenosed. Diffuse.         ASSESSMENT AND PLAN:  CAD (coronary artery disease) Patient had DES to the RCA. Because of kidney disease intervention on the obtuse marginal was not performed and to be scheduled  as an outpatient. We will check the patient's renal function today. If it has come down we will scheduled a cardiac catheterization for next week. Patient is agreeable to proceed. I have reviewed the risks, indications, and alternatives to angioplasty and stenting with the patient.  Risks include but are not limited to bleeding, infection, vascular injury, stroke, myocardial infection, arrhythmia, kidney injury, radiation-related injury in the case of prolonged fluoroscopy use, emergency cardiac surgery, and death. The patient understands the risks of serious complication is low (<1%) and he agrees to proceed.     Hypertension Blood pressure stable  Nocturnal Sinus bradycardia Will need outpatient sleep study after her next heart catheterization  Bilateral carotid bruits Check carotid Dopplers last ones done in 2013  CKD (chronic kidney disease), stage III Check renal function today and schedule for staged PCI of the OM if kidney stable    Signed, Jacolyn Reedy, PA-C  02/13/2016 1:31 PM    [facility] [address] [place], [place], [place]  [address] Phone: [phone]; Fax: [phone]

## 2016-02-20 NOTE — Discharge Instructions (Signed)
Radial Site Care °Refer to this sheet in the next few weeks. These instructions provide you with information about caring for yourself after your procedure. Your health care provider may also give you more specific instructions. Your treatment has been planned according to current medical practices, but problems sometimes occur. Call your health care provider if you have any problems or questions after your procedure. °WHAT TO EXPECT AFTER THE PROCEDURE °After your procedure, it is typical to have the following: °· Bruising at the radial site that usually fades within 1-2 weeks. °· Blood collecting in the tissue (hematoma) that may be painful to the touch. It should usually decrease in size and tenderness within 1-2 weeks. °HOME CARE INSTRUCTIONS °· Take medicines only as directed by your health care provider. °· You may shower 24-48 hours after the procedure or as directed by your health care provider. Remove the bandage (dressing) and gently wash the site with plain soap and water. Pat the area dry with a clean towel. Do not rub the site, because this may cause bleeding. °· Do not take baths, swim, or use a hot tub until your health care provider approves. °· Check your insertion site every day for redness, swelling, or drainage. °· Do not apply powder or lotion to the site. °· Do not flex or bend the affected arm for 24 hours or as directed by your health care provider. °· Do not push or pull heavy objects with the affected arm for 24 hours or as directed by your health care provider. °· Do not lift over 10 lb (4.5 kg) for 5 days after your procedure or as directed by your health care provider. °· Ask your health care provider when it is okay to: °¨ Return to work or school. °¨ Resume usual physical activities or sports. °¨ Resume sexual activity. °· Do not drive home if you are discharged the same day as the procedure. Have someone else drive you. °· You may drive 24 hours after the procedure unless otherwise  instructed by your health care provider. °· Do not operate machinery or power tools for 24 hours after the procedure. °· If your procedure was done as an outpatient procedure, which means that you went home the same day as your procedure, a responsible adult should be with you for the first 24 hours after you arrive home. °· Keep all follow-up visits as directed by your health care provider. This is important. °SEEK MEDICAL CARE IF: °· You have a fever. °· You have chills. °· You have increased bleeding from the radial site. Hold pressure on the site. °SEEK IMMEDIATE MEDICAL CARE IF: °· You have unusual pain at the radial site. °· You have redness, warmth, or swelling at the radial site. °· You have drainage (other than a small amount of blood on the dressing) from the radial site. °· The radial site is bleeding, and the bleeding does not stop after 30 minutes of holding steady pressure on the site. °· Your arm or hand becomes pale, cool, tingly, or numb. °  °This information is not intended to replace advice given to you by your health care provider. Make sure you discuss any questions you have with your health care provider. °  °Document Released: 12/07/2010 Document Revised: 11/25/2014 Document Reviewed: 05/23/2014 °Elsevier Interactive Patient Education ©2016 [employer] ° °

## 2016-02-20 NOTE — Interval H&P Note (Signed)
Cath Lab Visit (complete for each Cath Lab visit)  Clinical Evaluation Leading to the Procedure:   ACS: No.  Non-ACS:    Anginal Classification: CCS III  Anti-ischemic medical therapy: Maximal Therapy (2 or more classes of medications)  Non-Invasive Test Results: No non-invasive testing performed  Prior CABG: No previous CABG      History and Physical Interval Note:  02/20/2016 9:25 PM  Teresa Hart  has presented today for surgery, with the diagnosis of cad  The various methods of treatment have been discussed with the patient and family. After consideration of risks, benefits and other options for treatment, the patient has consented to  Procedure(s): Coronary Stent Intervention (N/A) Coronary Angiogram as a surgical intervention .  The patient's history has been reviewed, patient examined, no change in status, stable for surgery.  I have reviewed the patient's chart and labs.  Questions were answered to the patient's satisfaction.     Lesleigh NoeSMITH III,HENRY W

## 2016-02-21 ENCOUNTER — Encounter (HOSPITAL_COMMUNITY): Payer: Self-pay | Admitting: Interventional Cardiology

## 2016-02-22 ENCOUNTER — Other Ambulatory Visit: Payer: Self-pay | Admitting: "Endocrinology

## 2016-02-22 LAB — HEPATIC FUNCTION PANEL
AG Ratio: 1.4 Ratio (ref 1.0–2.5)
ALK PHOS: 90 U/L (ref 33–130)
ALT: 11 U/L (ref 6–29)
AST: 13 U/L (ref 10–35)
Albumin: 3.6 g/dL (ref 3.6–5.1)
Globulin: 2.6 g/dL (ref 1.9–3.7)
TOTAL PROTEIN: 6.2 g/dL (ref 6.1–8.1)
Total Bilirubin: 0.3 mg/dL (ref 0.2–1.2)

## 2016-02-22 LAB — BASIC METABOLIC PANEL  EGFR
BUN/Creatinine Ratio: 9.1 Ratio (ref 6–22)
BUN: 14 mg/dL (ref 7–25)
CO2: 26 mmol/L (ref 20–31)
Calcium: 9.2 mg/dL (ref 8.6–10.4)
Chloride: 103 mmol/L (ref 98–110)
Creat: 1.54 mg/dL — ABNORMAL HIGH (ref 0.50–0.99)
GFR, EST NON AFRICAN AMERICAN: 36 mL/min — AB (ref >=60)
GFR, Est African American: 41 mL/min — ABNORMAL LOW (ref >=60)
GLUCOSE: 164 mg/dL — AB (ref 65–99)
Potassium: 4.8 mmol/L (ref 3.5–5.3)
Sodium: 140 mmol/L (ref 135–146)

## 2016-02-26 ENCOUNTER — Other Ambulatory Visit: Payer: Self-pay | Admitting: "Endocrinology

## 2016-03-01 ENCOUNTER — Ambulatory Visit: Payer: Medicare Other | Admitting: "Endocrinology

## 2016-03-04 ENCOUNTER — Ambulatory Visit: Payer: Medicare Other | Admitting: Adult Health

## 2016-03-04 ENCOUNTER — Ambulatory Visit (INDEPENDENT_AMBULATORY_CARE_PROVIDER_SITE_OTHER): Payer: Medicare Other | Admitting: "Endocrinology

## 2016-03-04 ENCOUNTER — Encounter: Payer: Self-pay | Admitting: "Endocrinology

## 2016-03-04 VITALS — BP 132/77 | HR 89 | Ht 65.0 in | Wt 216.0 lb

## 2016-03-04 DIAGNOSIS — I10 Essential (primary) hypertension: Secondary | ICD-10-CM

## 2016-03-04 DIAGNOSIS — N183 Chronic kidney disease, stage 3 unspecified: Secondary | ICD-10-CM

## 2016-03-04 DIAGNOSIS — E1159 Type 2 diabetes mellitus with other circulatory complications: Secondary | ICD-10-CM

## 2016-03-04 DIAGNOSIS — E785 Hyperlipidemia, unspecified: Secondary | ICD-10-CM

## 2016-03-04 DIAGNOSIS — E1122 Type 2 diabetes mellitus with diabetic chronic kidney disease: Secondary | ICD-10-CM | POA: Diagnosis not present

## 2016-03-04 MED ORDER — INSULIN PEN NEEDLE 31G X 8 MM MISC: 1.0000 | Status: AC

## 2016-03-04 MED ORDER — INSULIN GLARGINE 100 UNIT/ML SOLOSTAR PEN: 90.0000 [IU] | PEN_INJECTOR | Freq: Every day | SUBCUTANEOUS | Status: DC

## 2016-03-04 MED ORDER — LIRAGLUTIDE 18 MG/3ML ~~LOC~~ SOPN: 1.8000 mg | PEN_INJECTOR | Freq: Every day | SUBCUTANEOUS | Status: DC

## 2016-03-04 MED ORDER — INSULIN LISPRO 100 UNIT/ML (KWIKPEN): 18.0000 [IU] | PEN_INJECTOR | Freq: Three times a day (TID) | SUBCUTANEOUS | Status: DC

## 2016-03-04 NOTE — Patient Instructions (Signed)

## 2016-03-04 NOTE — Progress Notes (Signed)
Subjective:    Patient ID: Teresa Hart, female    DOB: 07/12/1951, PCP Abran RichardBAUCOM, JENNY B, PA-C   Past Medical History  Diagnosis Date  . Hypertensive heart disease   . Hyperlipidemia   . GERD (gastroesophageal reflux disease)   . Obese   . Type I diabetes mellitus (HCC)   . Hypothyroid   . Asthma   . Cardiac enlargement   . Aortic stenosis, mild     a. per echo Jan 2013; EF is 61%  . Carotid stenosis, bilateral     a. per doppler Jan 2013; 60-79% RICA, 40-59% LICA  . CAD (coronary artery disease)     a. 11/2011 abnl MV; b. 11/2011 Cath: No obstructive disease in the large caliber vessels, disease in the small OM2 and distal LAD, not amenable to PCI or grafting; c. 01/2016 PCI: LM nl, LAD 30ost, 2913m, LCX nl, OM1 small, OM2 90 diff, OM3 small, RCA  95p/m 2.75 x 24 Promus Premier DES, 2873m. Plan staged PCI of OM2.  . CKD (chronic kidney disease), stage III    Past Surgical History  Procedure Laterality Date  . Abdominal hysterectomy    . Tonsillectomy    . Knee surgery    . Appendectomy    . Cardiac catheterization  Jan 2013    No major obstructive disease in the large caliber vessels, with disease in a small OM2 and distal LAD; not amenable to PCI or grafting.   . Knee arthroscopy  06/24/2012    Procedure: ARTHROSCOPY KNEE;  Surgeon: Nestor LewandowskyFrank J Rowan, MD;  Location: [facility];  Service: Orthopedics;  Laterality: Right;  DEBRIDEMENT OF CHONDROMALACIA  . Cardiac catheterization N/A 02/05/2016    Procedure: Left Heart Cath and Coronary Angiography;  Surgeon: Peter M SwazilandJordan, MD;  [facility];  Service: Cardiovascular;  Laterality: N/A;  . Cardiac catheterization  02/05/2016    Procedure: Coronary Stent Intervention;  Surgeon: Peter M SwazilandJordan, MD;  [facility];  Service: Cardiovascular;;  . Cardiac catheterization N/A 02/20/2016    Procedure: Coronary Stent Intervention;  Surgeon: Lyn RecordsHenry W Smith, MD;  [facility];  Service:  Cardiovascular;  Laterality: N/A;   Social History   Social History  . Marital Status: Married    Spouse Name: N/A  . Number of Children: 0  . Years of Education: N/A   Occupational History  . Disabled    Social History Main Topics  . Smoking status: Never Smoker   . Smokeless tobacco: Never Used  . Alcohol Use: No  . Drug Use: No  . Sexual Activity: Yes   Other Topics Concern  . None   Social History Narrative   Lives with husband, disabled from multiple medical issues.   Outpatient Encounter Prescriptions as of 03/04/2016  Medication Sig  . Insulin Glargine (LANTUS SOLOSTAR) 100 UNIT/ML Solostar Pen Inject 90 Units into the skin at bedtime.  . insulin lispro (HUMALOG) 100 UNIT/ML KiwkPen Inject 0.18-0.24 mLs (18-24 Units total) into the skin 3 (three) times daily with meals.  . [DISCONTINUED] Insulin Glargine (LANTUS SOLOSTAR [place]) Inject 90 Units into the skin at bedtime.  . [DISCONTINUED] insulin lispro (HUMALOG) 100 UNIT/ML KiwkPen Inject 18-24 Units into the skin 3 (three) times daily with meals.  Marland Kitchen. ACCU-CHEK AVIVA PLUS test strip CHECK BLOOD SUGAR 4 TIMES DAILY.  Marland Kitchen. albuterol (PROVENTIL HFA;VENTOLIN HFA) 108 (90 BASE) MCG/ACT inhaler Inhale 2 puffs into the lungs every 6 (six) hours  as needed.  . ARIPiprazole (ABILIFY) 20 MG tablet Take 10 mg by mouth every evening.   Marland Kitchen aspirin EC 81 MG tablet Take 1 tablet (81 mg total) by mouth daily.  Marland Kitchen atorvastatin (LIPITOR) 80 MG tablet Take 1 tablet (80 mg total) by mouth daily at 6 PM.  . carvedilol (COREG) 25 MG tablet Take 1 tablet (25 mg total) by mouth 2 (two) times daily with a meal.  . clopidogrel (PLAVIX) 75 MG tablet Take 1 tablet (75 mg total) by mouth daily.  . diazepam (VALIUM) 5 MG tablet Take 5 mg by mouth every 6 (six) hours as needed for anxiety.  . hydrALAZINE (APRESOLINE) 50 MG tablet Take 1 tablet (50 mg total) by mouth 3 (three) times daily.  . Insulin Pen Needle (B-D ULTRAFINE III SHORT PEN) 31G X 8 MM MISC 1  each by Does not apply route as directed.  Marland Kitchen levocetirizine (XYZAL) 5 MG tablet Take 1 tablet (5 mg total) by mouth every evening.  . Liraglutide (VICTOZA) 18 MG/3ML SOPN Inject 0.3 mLs (1.8 mg total) into the skin at bedtime.  Marland Kitchen LORazepam (ATIVAN) 1 MG tablet Take 1 mg by mouth as needed for anxiety or sleep.   . nitroGLYCERIN (NITROSTAT) 0.4 MG SL tablet Place 1 tablet (0.4 mg total) under the tongue every 5 (five) minutes as needed for chest pain.  . pantoprazole (PROTONIX) 40 MG tablet Take 1 tablet (40 mg total) by mouth 2 (two) times daily.  . Vitamin D, Ergocalciferol, (DRISDOL) 50000 units CAPS capsule Take 1 capsule (50,000 Units total) by mouth every 7 (seven) days.  . Vortioxetine HBr (TRINTELLIX) 20 MG TABS Take 20 mg by mouth daily.   . [DISCONTINUED] HUMALOG KWIKPEN 100 UNIT/ML SOPN INJECT 20 UNITS S.Q. THREE TIMES A DAY WITH MEALS.  . [DISCONTINUED] Insulin Glargine (LANTUS SOLOSTAR) 100 UNIT/ML Solostar Pen Inject 100 Units into the skin at bedtime. INJECT 180 UNITS DAILY AS DIRECTED.  . [DISCONTINUED] Liraglutide (VICTOZA) 18 MG/3ML SOPN Inject 1.8 Units into the skin at bedtime.    No facility-administered encounter medications on file as of 03/04/2016.   ALLERGIES: Allergies  Allergen Reactions  . Brilinta [Ticagrelor] Anaphylaxis  . Amoxicillin-Pot Clavulanate Nausea Only  . Crestor [Rosuvastatin]     Muscle spasms and cramps  . Codeine     REACTION: nausea  . Penicillins     REACTION: major hives,swelling   VACCINATION STATUS: Immunization History  Administered Date(s) Administered  . Influenza Split 08/05/2012  . Tdap 03/15/2013  . Zoster 03/16/2013    Diabetes She presents for her follow-up diabetic visit. She has type 2 diabetes mellitus. Onset time: She was diagnosed at approximate age of 46 years. Her disease course has been improving. There are no hypoglycemic associated symptoms. Pertinent negatives for hypoglycemia include no confusion, headaches, pallor  or seizures. There are no diabetic associated symptoms. Pertinent negatives for diabetes include no chest pain, no polydipsia, no polyphagia and no polyuria. There are no hypoglycemic complications. Symptoms are improving. Diabetic complications include heart disease and nephropathy. Risk factors for coronary artery disease include diabetes mellitus, dyslipidemia, hypertension, sedentary lifestyle and obesity. Current diabetic treatment includes intensive insulin program. She is compliant with treatment most of the time. Her weight is stable. She is following a generally unhealthy diet. When asked about meal planning, she reported none. She has had a previous visit with a dietitian. She rarely participates in exercise. Her home blood glucose trend is decreasing steadily. Her breakfast blood glucose range is generally  140-180 mg/dl. Her lunch blood glucose range is generally 140-180 mg/dl. Her dinner blood glucose range is generally 140-180 mg/dl. Her overall blood glucose range is 140-180 mg/dl. Eye exam is current.  Hyperlipidemia This is a chronic problem. The current episode started more than 1 year ago. The problem is uncontrolled. Recent lipid tests were reviewed and are high. Exacerbating diseases include diabetes and obesity. Associated symptoms include leg pain. Pertinent negatives include no chest pain, myalgias or shortness of breath. Current antihyperlipidemic treatment includes statins. The current treatment provides no improvement of lipids. Risk factors for coronary artery disease include diabetes mellitus, dyslipidemia, hypertension, obesity and a sedentary lifestyle.  Hypertension This is a chronic problem. The current episode started more than 1 year ago. The problem is controlled. Pertinent negatives include no chest pain, headaches, palpitations or shortness of breath. Risk factors for coronary artery disease include dyslipidemia, diabetes mellitus, obesity and sedentary lifestyle. Past  treatments include direct vasodilators. Hypertensive end-organ damage includes kidney disease and CAD/MI.     Review of Systems  Constitutional: Negative for fever, chills and unexpected weight change.  HENT: Negative for trouble swallowing and voice change.   Eyes: Negative for visual disturbance.  Respiratory: Negative for cough, shortness of breath and wheezing.   Cardiovascular: Negative for chest pain, palpitations and leg swelling.  Gastrointestinal: Negative for nausea, vomiting and diarrhea.  Endocrine: Negative for cold intolerance, heat intolerance, polydipsia, polyphagia and polyuria.  Musculoskeletal: Negative for myalgias and arthralgias.  Skin: Negative for color change, pallor, rash and wound.  Neurological: Negative for seizures and headaches.  Psychiatric/Behavioral: Negative for suicidal ideas and confusion.    Objective:    BP 132/77 mmHg  Pulse 89  Ht 5\' 5"  (1.651 m)  Wt 216 lb (97.977 kg)  BMI 35.94 kg/m2  SpO2 97%  Wt Readings from Last 3 Encounters:  03/04/16 216 lb (97.977 kg)  02/20/16 216 lb (97.977 kg)  02/13/16 216 lb 6.4 oz (98.158 kg)    Physical Exam  Constitutional: She is oriented to person, place, and time. She appears well-developed.  HENT:  Head: Normocephalic and atraumatic.  Eyes: EOM are normal.  Neck: Normal range of motion. Neck supple. No tracheal deviation present. No thyromegaly present.  Cardiovascular: Normal rate and regular rhythm.   Pulmonary/Chest: Effort normal and breath sounds normal.  Abdominal: Soft. Bowel sounds are normal. There is no tenderness. There is no guarding.  Musculoskeletal: Normal range of motion. She exhibits no edema.  Neurological: She is alert and oriented to person, place, and time. She has normal reflexes. No cranial nerve deficit. Coordination normal.  Skin: Skin is warm and dry. No rash noted. No erythema. No pallor.  Psychiatric: She has a normal mood and affect. Judgment normal.     CMP (  most recent) CMP     Component Value Date/Time   NA 139 02/13/2016 1430   K 4.2 02/13/2016 1430   CL 107 02/13/2016 1430   CO2 25 02/13/2016 1430   GLUCOSE 86 02/13/2016 1430   BUN 15 02/13/2016 1430   CREATININE 1.57* 02/13/2016 1430   CREATININE 1.25* 02/18/2013 1047   CREATININE 1.42* 10/01/2010 1344   CALCIUM 9.1 02/13/2016 1430   PROT 6.4* 02/03/2016 0317   ALBUMIN 3.1* 02/03/2016 0317   AST 31 02/03/2016 0317   ALT 19 02/03/2016 0317   ALKPHOS 88 02/03/2016 0317   BILITOT 0.4 02/03/2016 0317   GFRNONAA 34* 02/13/2016 1430   GFRAA 39* 02/13/2016 1430     Diabetic Labs (  most recent): Lab Results  Component Value Date   HGBA1C 7.7* 02/02/2016   HGBA1C 8.5 03/15/2013   HGBA1C 9.7 02/18/2013     Lipid Panel ( most recent) Lipid Panel     Component Value Date/Time   CHOL 332* 02/03/2016 0317   TRIG 311* 02/03/2016 0317   HDL 48 02/03/2016 0317   CHOLHDL 6.9 02/03/2016 0317   VLDL 62* 02/03/2016 0317   LDLCALC 222* 02/03/2016 0317      Assessment & Plan:   1. Diabetes mellitus with stage 3 chronic kidney disease (HCC)  -her diabetes is  complicated by  Coronary artery disease which required stent placement since her last visit, stage 3 chronic kidney disease and patient remains at a high risk for more acute and chronic complications of diabetes which include CAD, CVA, CKD, retinopathy, and neuropathy. These are all discussed in detail with the patient.  Patient came with improved glucose profile, and  recent A1c of 7.7% generally improving from 9.1 %.  Glucose logs and insulin administration records pertaining to this visit,  to be scanned into patient's records.  Recent labs reviewed.   - I have re-counseled the patient on diet management and weight loss  by adopting a carbohydrate restricted / protein rich  Diet.  - Suggestion is made for patient to avoid simple carbohydrates   from their diet including Cakes , Desserts, Ice Cream,  Soda (  diet and regular)  , Sweet Tea , Candies,  Chips, Cookies, Artificial Sweeteners,   and "Sugar-free" Products .  This will help patient to have stable blood glucose profile and potentially avoid unintended  Weight gain.  - Patient is advised to stick to a routine mealtimes to eat 3 meals  a day and avoid unnecessary snacks ( to snack only to correct hypoglycemia).  - The patient  has been  scheduled with Norm Salt, RDN, CDE for individualized DM education.  - I have approached patient with the following individualized plan to manage diabetes and patient agrees.  - I will proceed with basal insulin Lantus 90 units QHS, and prandial insulin Humalog 18 units TIDAC for pre-meal BG readings of 90-150mg /dl, plus patient specific correction dose of rapid acting insulin  for unexpected hyperglycemia above 150mg /dl, associated with strict monitoring of glucose  AC and HS. - Patient is warned not to take insulin without proper monitoring per orders. -Adjustment parameters are given for hypo and hyperglycemia in writing. -Patient is encouraged to call clinic for blood glucose levels less than 70 or above 300 mg /dl. - I will continue Victoza 1.8 mg subcutaneously daily, therapeutically suitable for patient. -Patient is not a candidate for metformin and insert inhibitors due to CKD. -Since she has achieved reasonable control of diabetes with A1c of 7.4%, she would not need U500 insulin for now. - Patient specific target  for A1c; LDL, HDL, Triglycerides, and  Waist Circumference were discussed in detail.  2) BP/HTN: Controlled. Continue current medications including ACEI/ARB. 3) Lipids/HPL:  continue statins. 4)  Weight/Diet: CDE consult in progress, exercise, and carbohydrates information provided.  5) Chronic Care/Health Maintenance:  -Patient is on ACEI/ARB and Statin medications and encouraged to continue to follow up with Ophthalmology, Podiatrist at least yearly or according to recommendations, and advised to   stay away from smoking. I have recommended yearly flu vaccine and pneumonia vaccination at least every 5 years; moderate intensity exercise for up to 150 minutes weekly; and  sleep for at least 7 hours a day.  -  25 minutes of time was spent on the care of this patient , 50% of which was applied for counseling on diabetes complications and their preventions.  - I advised patient to maintain close follow up with BAUCOM, JENNY B, PA-C for primary care needs.  Patient is asked to bring meter and  blood glucose logs during their next visit.   Follow up plan: -Return in about 3 months (around 06/03/2016) for diabetes, high blood pressure, high cholesterol, follow up with pre-visit labs, meter, and logs.  Marquis Lunch, MD Phone: [phone]  Fax: [phone]   03/04/2016, 3:32 PM

## 2016-03-05 ENCOUNTER — Other Ambulatory Visit: Payer: Self-pay

## 2016-03-05 MED ORDER — VITAMIN D (ERGOCALCIFEROL) 1.25 MG (50000 UNIT) PO CAPS: 50000.0000 [IU] | ORAL_CAPSULE | ORAL | Status: DC

## 2016-03-07 ENCOUNTER — Encounter: Payer: Self-pay | Admitting: Physician Assistant

## 2016-03-07 ENCOUNTER — Ambulatory Visit (INDEPENDENT_AMBULATORY_CARE_PROVIDER_SITE_OTHER): Payer: Medicare Other | Admitting: Adult Health

## 2016-03-07 VITALS — BP 148/64 | HR 86 | Ht 65.0 in | Wt 214.0 lb

## 2016-03-07 DIAGNOSIS — I2510 Atherosclerotic heart disease of native coronary artery without angina pectoris: Secondary | ICD-10-CM | POA: Diagnosis not present

## 2016-03-07 DIAGNOSIS — I10 Essential (primary) hypertension: Secondary | ICD-10-CM | POA: Diagnosis not present

## 2016-03-07 MED ORDER — ATORVASTATIN CALCIUM 80 MG PO TABS: 80.0000 mg | ORAL_TABLET | Freq: Every day | ORAL | Status: DC

## 2016-03-07 MED ORDER — CARVEDILOL 25 MG PO TABS: 25.0000 mg | ORAL_TABLET | Freq: Two times a day (BID) | ORAL | Status: DC

## 2016-03-07 MED ORDER — PANTOPRAZOLE SODIUM 40 MG PO TBEC: 40.0000 mg | DELAYED_RELEASE_TABLET | Freq: Two times a day (BID) | ORAL | Status: DC

## 2016-03-07 MED ORDER — CLOPIDOGREL BISULFATE 75 MG PO TABS: 75.0000 mg | ORAL_TABLET | Freq: Every day | ORAL | Status: DC

## 2016-03-07 MED ORDER — HYDRALAZINE HCL 50 MG PO TABS: 50.0000 mg | ORAL_TABLET | Freq: Three times a day (TID) | ORAL | Status: DC

## 2016-03-07 NOTE — Progress Notes (Signed)
Name: Teresa Hart    DOB: 1950/11/20  Age: 65 y.o.  MR#: [id number]       PCP:  Abran Richard, PA-C      Insurance: Payor: [employer] MEDICARE / Plan: [employer] MEDICARE / Product Type: *No Product type* /   CC:   No chief complaint on file.   VS Filed Vitals:   03/07/16 1124  BP: 148/64  Pulse: 86  Height: 5\' 5"  (1.651 m)  Weight: 214 lb (97.07 kg)  SpO2: 96%    Weights Current Weight  03/07/16 214 lb (97.07 kg)  03/04/16 216 lb (97.977 kg)  02/20/16 216 lb (97.977 kg)    Blood Pressure  BP Readings from Last 3 Encounters:  03/07/16 148/64  03/04/16 132/77  02/20/16 144/65     Admit date:  (Not on file) Last encounter with RMR:  Visit date not found   Allergy Brilinta; Amoxicillin-pot clavulanate; Crestor; Codeine; and Penicillins  Current Outpatient Prescriptions  Medication Sig Dispense Refill  . ACCU-CHEK AVIVA PLUS test strip CHECK BLOOD SUGAR 4 TIMES DAILY. 450 each 2  . albuterol (PROVENTIL HFA;VENTOLIN HFA) 108 (90 BASE) MCG/ACT inhaler Inhale 2 puffs into the lungs every 6 (six) hours as needed. 18 g 2  . ARIPiprazole (ABILIFY) 20 MG tablet Take 10 mg by mouth every evening.     Marland Kitchen aspirin EC 81 MG tablet Take 1 tablet (81 mg total) by mouth daily. 150 tablet 2  . atorvastatin (LIPITOR) 80 MG tablet Take 1 tablet (80 mg total) by mouth daily at 6 PM. 30 tablet 6  . carvedilol (COREG) 25 MG tablet Take 1 tablet (25 mg total) by mouth 2 (two) times daily with a meal. 60 tablet 6  . clopidogrel (PLAVIX) 75 MG tablet Take 1 tablet (75 mg total) by mouth daily. 30 tablet 6  . diazepam (VALIUM) 5 MG tablet Take 5 mg by mouth every 6 (six) hours as needed for anxiety.    . hydrALAZINE (APRESOLINE) 50 MG tablet Take 1 tablet (50 mg total) by mouth 3 (three) times daily. 90 tablet 6  . Insulin Glargine (LANTUS SOLOSTAR) 100 UNIT/ML Solostar Pen Inject 90 Units into the skin at bedtime. 45 mL 3  . insulin lispro (HUMALOG) 100 UNIT/ML KiwkPen Inject 0.18-0.24 mLs  (18-24 Units total) into the skin 3 (three) times daily with meals. 45 mL 2  . Insulin Pen Needle (B-D ULTRAFINE III SHORT PEN) 31G X 8 MM MISC 1 each by Does not apply route as directed. 150 each 3  . levocetirizine (XYZAL) 5 MG tablet Take 1 tablet (5 mg total) by mouth every evening. 30 tablet 5  . Liraglutide (VICTOZA) 18 MG/3ML SOPN Inject 0.3 mLs (1.8 mg total) into the skin at bedtime. 6 mL 2  . LORazepam (ATIVAN) 1 MG tablet Take 1 mg by mouth as needed for anxiety or sleep.     . nitroGLYCERIN (NITROSTAT) 0.4 MG SL tablet Place 1 tablet (0.4 mg total) under the tongue every 5 (five) minutes as needed for chest pain. 25 tablet 3  . pantoprazole (PROTONIX) 40 MG tablet Take 1 tablet (40 mg total) by mouth 2 (two) times daily. 30 tablet 6  . Vitamin D, Ergocalciferol, (DRISDOL) 50000 units CAPS capsule Take 1 capsule (50,000 Units total) by mouth every 7 (seven) days. 12 capsule 0  . Vortioxetine HBr (TRINTELLIX) 20 MG TABS Take 20 mg by mouth daily.      No current facility-administered medications for this visit.  Discontinued Meds:   There are no discontinued medications.  Patient Active Problem List   Diagnosis Date Noted  . DM type 2 causing vascular disease (HCC) 03/04/2016  . Bilateral carotid bruits 02/13/2016  . Normocytic anemia 02/06/2016  . Nocturnal Sinus bradycardia 02/06/2016  . Snoring 02/06/2016  . Hypertensive heart disease   . CKD (chronic kidney disease), stage III   . NSTEMI (non-ST elevated myocardial infarction) (HCC) 02/02/2016  . Diabetes mellitus with stage 3 chronic kidney disease (HCC) 11/30/2015  . Chronic renal failure 01/28/2012  . CAD (coronary artery disease) 12/27/2011  . Aortic stenosis, mild 12/27/2011  . Nonspecific abnormal unspecified cardiovascular function study 12/10/2011  . Cerebrovascular disease 12/10/2011  . Dyspnea 10/29/2011  . Hypertension 10/29/2011  . Hyperlipidemia 10/29/2011  . Murmur 10/29/2011  . Bruit 10/29/2011  .  Cardiac enlargement 10/29/2011    LABS    Component Value Date/Time   NA 139 02/13/2016 1430   NA 140 02/06/2016 0319   NA 139 02/05/2016 0215   K 4.2 02/13/2016 1430   K 4.4 02/06/2016 0319   K 4.5 02/05/2016 0215   CL 107 02/13/2016 1430   CL 108 02/06/2016 0319   CL 106 02/05/2016 0215   CO2 25 02/13/2016 1430   CO2 23 02/06/2016 0319   CO2 24 02/05/2016 0215   GLUCOSE 86 02/13/2016 1430   GLUCOSE 179* 02/06/2016 0319   GLUCOSE 193* 02/05/2016 0215   BUN 15 02/13/2016 1430   BUN 19 02/06/2016 0319   BUN 22* 02/05/2016 0215   CREATININE 1.57* 02/13/2016 1430   CREATININE 1.73* 02/06/2016 0319   CREATININE 1.76* 02/05/2016 0215   CREATININE 1.25* 02/18/2013 1047   CREATININE 1.14* 11/16/2012 1318   CREATININE 1.42* 10/01/2010 1344   CALCIUM 9.1 02/13/2016 1430   CALCIUM 9.5 02/06/2016 0319   CALCIUM 9.3 02/05/2016 0215   GFRNONAA 34* 02/13/2016 1430   GFRNONAA 30* 02/06/2016 0319   GFRNONAA 30* 02/05/2016 0215   GFRAA 39* 02/13/2016 1430   GFRAA 35* 02/06/2016 0319   GFRAA 34* 02/05/2016 0215   CMP     Component Value Date/Time   NA 139 02/13/2016 1430   K 4.2 02/13/2016 1430   CL 107 02/13/2016 1430   CO2 25 02/13/2016 1430   GLUCOSE 86 02/13/2016 1430   BUN 15 02/13/2016 1430   CREATININE 1.57* 02/13/2016 1430   CREATININE 1.25* 02/18/2013 1047   CREATININE 1.42* 10/01/2010 1344   CALCIUM 9.1 02/13/2016 1430   PROT 6.4* 02/03/2016 0317   ALBUMIN 3.1* 02/03/2016 0317   AST 31 02/03/2016 0317   ALT 19 02/03/2016 0317   ALKPHOS 88 02/03/2016 0317   BILITOT 0.4 02/03/2016 0317   GFRNONAA 34* 02/13/2016 1430   GFRAA 39* 02/13/2016 1430       Component Value Date/Time   WBC 10.7* 02/13/2016 1430   WBC 12.0* 02/06/2016 0319   WBC 9.7 02/05/2016 0215   WBC 8.5 11/16/2012 1319   WBC 10.8* 01/28/2012 1601   WBC 8.4 06/03/2011 1307   WBC 7.8 04/02/2011 1331   WBC 8.4 02/19/2011 0839   HGB 9.8* 02/13/2016 1430   HGB 9.8* 02/06/2016 0319   HGB 9.8*  02/05/2016 0215   HGB 12.2 11/16/2012 1319   HGB 12.2 01/28/2012 1601   HGB 12.5 06/03/2011 1307   HGB 11.6 04/02/2011 1331   HGB 12.4 02/19/2011 0839   HCT 30.7* 02/13/2016 1430   HCT 31.8* 02/06/2016 0319   HCT 31.7* 02/05/2016 0215   HCT 38.6 11/16/2012  1319   HCT 38.2 01/28/2012 1601   HCT 36.9 06/03/2011 1307   HCT 33.9* 04/02/2011 1331   HCT 35.2 02/19/2011 0839   MCV 92.5 02/13/2016 1430   MCV 92.2 02/06/2016 0319   MCV 92.4 02/05/2016 0215   MCV 93.9 11/16/2012 1319   MCV 92.5 01/28/2012 1601   MCV 94.8 06/03/2011 1307   MCV 95.7 04/02/2011 1331   MCV 95.2 02/19/2011 0839    Lipid Panel     Component Value Date/Time   CHOL 332* 02/03/2016 0317   TRIG 311* 02/03/2016 0317   HDL 48 02/03/2016 0317   CHOLHDL 6.9 02/03/2016 0317   VLDL 62* 02/03/2016 0317   LDLCALC 222* 02/03/2016 0317    ABG    Component Value Date/Time   TCO2 26 12/28/2010 1610     Lab Results  Component Value Date   TSH 3.918 02/02/2016   BNP (last 3 results)  Recent Labs  02/02/16 0420  BNP 63.0    ProBNP (last 3 results) No results for input(s): PROBNP in the last 8760 hours.  Cardiac Panel (last 3 results) No results for input(s): CKTOTAL, CKMB, TROPONINI, RELINDX in the last 72 hours.  Iron/TIBC/Ferritin/ %Sat    Component Value Date/Time   IRON 87 06/03/2011 1307   TIBC 311 06/03/2011 1307   FERRITIN 117 06/03/2011 1307   IRONPCTSAT 28 06/03/2011 1307     EKG Orders placed or performed in visit on 02/13/16  . EKG 12-Lead     Prior Assessment and Plan Problem List as of 03/07/2016      Cardiovascular and Mediastinum   Hypertension   Last Assessment & Plan 02/13/2016 Office Visit Written 02/13/2016  1:33 PM by Dyann Kief, PA-C    Blood pressure stable      Cardiac enlargement   Last Assessment & Plan 10/29/2011 Office Visit Written 10/29/2011 10:48 AM by Lewayne Bunting, MD    Scheduled echocardiogram to quantify LV function.      Cerebrovascular  disease   Last Assessment & Plan 12/27/2011 Office Visit Written 12/27/2011 10:20 AM by Rosalio Macadamia, NP    To have repeat dopplers later this summer. Patient is aware.       CAD (coronary artery disease)   Last Assessment & Plan 02/13/2016 Office Visit Written 02/13/2016  1:33 PM by Dyann Kief, PA-C    Patient had DES to the RCA. Because of kidney disease intervention on the obtuse marginal was not performed and to be scheduled as an outpatient. We will check the patient's renal function today. If it has come down we will scheduled a cardiac catheterization for next week. Patient is agreeable to proceed. I have reviewed the risks, indications, and alternatives to angioplasty and stenting with the patient. Risks include but are not limited to bleeding, infection, vascular injury, stroke, myocardial infection, arrhythmia, kidney injury, radiation-related injury in the case of prolonged fluoroscopy use, emergency cardiac surgery, and death. The patient understands the risks of serious complication is low (<1%) and he agrees to proceed.         Aortic stenosis, mild   Last Assessment & Plan 12/27/2011 Office Visit Written 12/27/2011 10:19 AM by Rosalio Macadamia, NP    Will be followed with echo. Currently with no cardinal symptoms.       NSTEMI (non-ST elevated myocardial infarction) [facility])   Hypertensive heart disease   Nocturnal Sinus bradycardia   Last Assessment & Plan 02/13/2016 Office Visit Written 02/13/2016  1:34 PM by  Dyann KiefMichele M Lenze, PA-C    Will need outpatient sleep study after her next heart catheterization      DM type 2 causing vascular disease [facility](HCC)     Endocrine   Diabetes mellitus with stage 3 chronic kidney disease (HCC)     Genitourinary   Chronic renal failure   CKD (chronic kidney disease), stage III   Last Assessment & Plan 02/13/2016 Office Visit Written 02/13/2016  1:38 PM by Dyann KiefMichele M Lenze, PA-C    Check renal function today and schedule for staged PCI of the OM if  kidney stable        Other   Dyspnea   Last Assessment & Plan 10/29/2011 Office Visit Written 10/29/2011 10:49 AM by Lewayne BuntingBrian S Crenshaw, MD    Multiple risk factors including diabetes, hypertension, hyperlipidemia and family history. Schedule myoview for risk stratification.      Hyperlipidemia   Last Assessment & Plan 12/10/2011 Office Visit Written 12/10/2011 10:27 AM by Lewayne BuntingBrian S Crenshaw, MD    Continue statin.      Murmur   Last Assessment & Plan 12/10/2011 Office Visit Written 12/10/2011 10:26 AM by Lewayne BuntingBrian S Crenshaw, MD    Mild aortic stenosis on previous echocardiogram. We will plan followup studies in the future.      Bruit   Last Assessment & Plan 10/29/2011 Office Visit Written 10/29/2011 10:49 AM by Lewayne BuntingBrian S Crenshaw, MD    Carotid Dopplers were left carotid bruit. Enteric-coated aspirin 81 mg daily.      Nonspecific abnormal unspecified cardiovascular function study   Last Assessment & Plan 12/10/2011 Office Visit Edited 12/10/2011 10:39 AM by Lewayne BuntingBrian S Crenshaw, MD    Patient's Myoview appears to show a prior inferior lateral infarct with mild peri-infarct ischemia. She has had no chest pain but has 10 years of diabetes mellitus. She therefore may not have had symptoms. Continue aspirin and statin. I feel cardiac catheterization is warranted. The risks and benefits including myocardial infarction, stroke, death and renal insufficiency were discussed and the patient agrees to proceed. Note she has had some degree of renal insufficiency in the past. We will obtain her most recent blood work from her nephrologist. If she has significant renal insufficiency we will need to admit the night prior to the procedure for IV hydration. Otherwise we potentially could increase by mouth fluid intake the night prior to her procedure and proceed as an outpatient giving IV fluids the morning of the catheterization.  I obtained labs from nephrology this morning. BUN and creatinine on January 7 was 13 and  1.17. Upper limit of normal for creatinine for that lab is 1.0. GFR 51. I have asked the patient to increase by mouth fluid intake the night prior to the procedure. She will be hydrated the morning of the procedure. No V. gram. Repeat potassium and renal function the following morning. We will plan to proceed as an outpatient based on these results.      Normocytic anemia   Snoring   Bilateral carotid bruits   Last Assessment & Plan 02/13/2016 Office Visit Written 02/13/2016  1:37 PM by Dyann KiefMichele M Lenze, PA-C    Check carotid Dopplers last ones done in 2013          Imaging: [place] Carotid Duplex Bilateral  02/15/2016  CLINICAL DATA:  Coronary artery disease.  Carotid bruit. EXAM: BILATERAL CAROTID DUPLEX ULTRASOUND TECHNIQUE: Wallace CullensGray scale imaging, color Doppler and duplex ultrasound were performed of bilateral carotid and vertebral arteries in the neck.  COMPARISON:  No recent. FINDINGS: Criteria: Quantification of carotid stenosis is based on velocity parameters that correlate the residual internal carotid diameter with NASCET-based stenosis levels, using the diameter of the distal internal carotid lumen as the denominator for stenosis measurement. The following velocity measurements were obtained: RIGHT ICA:  189/58 cm/sec CCA:  100/21 cm/sec SYSTOLIC ICA/CCA RATIO:  2.3 DIASTOLIC ICA/CCA RATIO:  5.0 ECA:  142 cm/sec LEFT ICA:  198/59 cm/sec CCA:  114/22 cm/sec SYSTOLIC ICA/CCA RATIO:  1.9 DIASTOLIC ICA/CCA RATIO:  2.6 ECA:  253 cm/sec RIGHT CAROTID ARTERY: Moderate right carotid bifurcation atherosclerotic vascular disease with elevated flow velocities. RIGHT VERTEBRAL ARTERY:  Patent with antegrade flow. LEFT CAROTID ARTERY: Moderate left carotid bifurcation atherosclerotic vascular disease with elevated flow velocities. LEFT VERTEBRAL ARTERY:  Patent with antegrade flow. IMPRESSION: 1. Bilateral moderate carotid bifurcation atherosclerotic vascular disease with elevated flow velocities. Degree of stenosis  in the 50-69% range. 2. Vertebral arteries are patent antegrade flow. Electronically Signed   By: Maisie Fus  Register   On: 02/15/2016 12:36

## 2016-03-07 NOTE — Progress Notes (Signed)
Cardiology Office Note   Date:  03/07/2016   ID:  Teresa Hart, DOB [birth year], MRN [id number]  PCP:  Teresa Richard, PA-C  Cardiologist:  to Teresa Cain Sieve, NP   Chief Complaint  Patient presents with  . Coronary Artery Disease      History of Present Illness: Teresa Hart is a 65 y.o. female who presents for ongoing assessment and management of coronary artery disease, with a history of aortic valve stenosis, history of cerebrovascular disease, hypertension, hyperlipidemia, asthma, and diabetes.the patient was sent to [facility] hospital for cardiac catheterization in the setting of an abnormal stress test.the patient had been seen at [facility] hospital in the setting of non-ST elevation MI with a peak troponin of 9.51. The patient had a drug-eluting stent placed to the right coronary artery. The patient had disease in OM1 and was amenable for PCI  The patient had repeat cardiac catheterization on 02/20/2016,  1. Mid RCA lesion, 40% stenosed. 2. Ost LAD to Prox LAD lesion, 30% stenosed. 3. Mid LAD to Dist LAD lesion, 60% stenosed. 4. Ost 2nd Mrg to 2nd Mrg lesion, 90% stenosed. 5. 2nd Mrg lesion, 99% stenosed.  It was decided against PCI because of diffuse nature of disease and small vessel caliber noted in the acute marginal. It was the opinion of the cathing cardiologist that it would Teresa best treated with medical therapy. He was noted that she had a widely patent recently placed right coronary artery stent.She was to continue long-term dual antiplatelet therapy.  She comes today without any complaints. She has not yet established with cardiac rehabilitation but plans on stopping on her way home. She states that she feels her energy level is slowly improving. She has been tired for a long time and is now beginning to feel more energetic. She denies bleeding issues, chest pain, or dyspnea on exertion. She is tolerating the medications without incident.  Past Medical  History  Diagnosis Date  . Hypertensive heart disease   . Hyperlipidemia   . GERD (gastroesophageal reflux disease)   . Obese   . Type I diabetes mellitus (HCC)   . Hypothyroid   . Asthma   . Cardiac enlargement   . Aortic stenosis, mild     a. per echo Jan 2013; EF is 61%  . Carotid stenosis, bilateral     a. per doppler Jan 2013; 60-79% RICA, 40-59% LICA  . CAD (coronary artery disease)     a. 11/2011 abnl MV; b. 11/2011 Cath: No obstructive disease in the large caliber vessels, disease in the small OM2 and distal LAD, not amenable to PCI or grafting; c. 01/2016 PCI: LM nl, LAD 30ost, 69m, LCX nl, OM1 small, OM2 90 diff, OM3 small, RCA  95p/m 2.75 x 24 Promus Premier DES, 45m. Plan staged PCI of OM2.  . CKD (chronic kidney disease), stage III     Past Surgical History  Procedure Laterality Date  . Abdominal hysterectomy    . Tonsillectomy    . Knee surgery    . Appendectomy    . Cardiac catheterization  Jan 2013    No major obstructive disease in the large caliber vessels, with disease in a small OM2 and distal LAD; not amenable to PCI or grafting.   . Knee arthroscopy  06/24/2012    Procedure: ARTHROSCOPY KNEE;  Surgeon: Nestor Lewandowsky, MD;  Location: [facility];  Service: Orthopedics;  Laterality: Right;  DEBRIDEMENT OF CHONDROMALACIA  . Cardiac catheterization N/A 02/05/2016  Procedure: Left Heart Cath and Coronary Angiography;  Surgeon: Peter M Swaziland, MD;  Location: [facility] INVASIVE CV LAB;  Service: Cardiovascular;  Laterality: N/A;  . Cardiac catheterization  02/05/2016    Procedure: Coronary Stent Intervention;  Surgeon: Peter M Swaziland, MD;  Location: [facility] INVASIVE CV LAB;  Service: Cardiovascular;;  . Cardiac catheterization N/A 02/20/2016    Procedure: Coronary Stent Intervention;  Surgeon: Lyn Records, MD;  Location: [facility] INVASIVE CV LAB;  Service: Cardiovascular;  Laterality: N/A;     Current Outpatient Prescriptions  Medication Sig Dispense Refill  . ACCU-CHEK  AVIVA PLUS test strip CHECK BLOOD SUGAR 4 TIMES DAILY. 450 each 2  . albuterol (PROVENTIL HFA;VENTOLIN HFA) 108 (90 BASE) MCG/ACT inhaler Inhale 2 puffs into the lungs every 6 (six) hours as needed. 18 g 2  . ARIPiprazole (ABILIFY) 20 MG tablet Take 10 mg by mouth every evening.     Marland Kitchen aspirin EC 81 MG tablet Take 1 tablet (81 mg total) by mouth daily. 150 tablet 2  . atorvastatin (LIPITOR) 80 MG tablet Take 1 tablet (80 mg total) by mouth daily at 6 PM. 30 tablet 6  . carvedilol (COREG) 25 MG tablet Take 1 tablet (25 mg total) by mouth 2 (two) times daily with a meal. 60 tablet 6  . clopidogrel (PLAVIX) 75 MG tablet Take 1 tablet (75 mg total) by mouth daily. 30 tablet 6  . diazepam (VALIUM) 5 MG tablet Take 5 mg by mouth every 6 (six) hours as needed for anxiety.    . hydrALAZINE (APRESOLINE) 50 MG tablet Take 1 tablet (50 mg total) by mouth 3 (three) times daily. 90 tablet 6  . Insulin Glargine (LANTUS SOLOSTAR) 100 UNIT/ML Solostar Pen Inject 90 Units into the skin at bedtime. 45 mL 3  . insulin lispro (HUMALOG) 100 UNIT/ML KiwkPen Inject 0.18-0.24 mLs (18-24 Units total) into the skin 3 (three) times daily with meals. 45 mL 2  . Insulin Pen Needle (B-D ULTRAFINE III SHORT PEN) 31G X 8 MM MISC 1 each by Does not apply route as directed. 150 each 3  . levocetirizine (XYZAL) 5 MG tablet Take 1 tablet (5 mg total) by mouth every evening. 30 tablet 5  . Liraglutide (VICTOZA) 18 MG/3ML SOPN Inject 0.3 mLs (1.8 mg total) into the skin at bedtime. 6 mL 2  . LORazepam (ATIVAN) 1 MG tablet Take 1 mg by mouth as needed for anxiety or sleep.     . nitroGLYCERIN (NITROSTAT) 0.4 MG SL tablet Place 1 tablet (0.4 mg total) under the tongue every 5 (five) minutes as needed for chest pain. 25 tablet 3  . pantoprazole (PROTONIX) 40 MG tablet Take 1 tablet (40 mg total) by mouth 2 (two) times daily. 30 tablet 6  . Vitamin D, Ergocalciferol, (DRISDOL) 50000 units CAPS capsule Take 1 capsule (50,000 Units total) by  mouth every 7 (seven) days. 12 capsule 0  . Vortioxetine HBr (TRINTELLIX) 20 MG TABS Take 20 mg by mouth daily.      No current facility-administered medications for this visit.    Allergies:   Brilinta; Amoxicillin-pot clavulanate; Crestor; Codeine; and Penicillins    Social History:  The patient  reports that she has never smoked. She has never used smokeless tobacco. She reports that she does not drink alcohol or use illicit drugs.   Family History:  The patient's family history includes Coronary artery disease in her brother and brother; Heart disease in her brother.    ROS: All other systems  are reviewed and negative. Unless otherwise mentioned in H&P    PHYSICAL EXAM: VS:  BP 148/64 mmHg  Pulse 86  Ht 5\' 5"  (1.651 m)  Wt 214 lb (97.07 kg)  BMI 35.61 kg/m2  SpO2 96% , BMI Body mass index is 35.61 kg/(m^2). GEN: Well nourished, well developed, in no acute distress HEENT: normal Neck: no JVD, carotid bruits, or masses Cardiac: RRR; no murmurs, rubs, or gallops,no edema  Respiratory:  clear to auscultation bilaterally, normal work of breathing GI: soft, nontender, nondistended, + BS MS: no deformity or atrophy Skin: warm and dry, no rash Neuro:  Strength and sensation are intact Psych: euthymic mood, full affect   Recent Labs: 02/02/2016: B Natriuretic Peptide 63.0; TSH 3.918 02/03/2016: ALT 19 02/13/2016: BUN 15; Creatinine, Ser 1.57*; Hemoglobin 9.8*; Platelets 323; Potassium 4.2; Sodium 139    Lipid Panel    Component Value Date/Time   CHOL 332* 02/03/2016 0317   TRIG 311* 02/03/2016 0317   HDL 48 02/03/2016 0317   CHOLHDL 6.9 02/03/2016 0317   VLDL 62* 02/03/2016 0317   LDLCALC 222* 02/03/2016 0317      Wt Readings from Last 3 Encounters:  03/07/16 214 lb (97.07 kg)  03/04/16 216 lb (97.977 kg)  02/20/16 216 lb (97.977 kg)      Other studies Reviewed: Additional studies/ records that were reviewed today include: previous cardiac catheterization Review  of the above records demonstrates: drug-eluting stent to the right coronary artery.   ASSESSMENT AND PLAN:  1. Coronary artery disease: Status post repeat catheterization after recent stent to right coronary artery for intervention of the OM1. Unfortunately, it was not found by the cathing cardiologist that she would benefit from PCI due to diffuse disease. It is recommended that she Teresa treated medically. She is continued until advised platelet therapy, she remains on Plavix as she has an allergy to Brilinta. She has been referred to cardiac rehabilitation and we'll Teresa stopping by their on her way home, she states that her husband has gone through cardiac rehabilitation.  2. Hypertension:blood pressure is currently well-controlled. We'll make no changes in her medication regimen at this time.  3. Obesity: she will Teresa attending cardiac rehabilitation and will receive education on exercise, diet, with encouragement to continue healthy lifestyle changes which will lead to weight loss.   Current medicines are reviewed at length with the patient today.    Labs/ tests ordered today include:  No orders of the defined types were placed in this encounter.     Disposition:   FU with to Teresa [place]. We'll see her again in 3 months. Signed, Joni ReiningKathryn Modena Bellemare, NP  03/07/2016 12:19 PM    [facility] [address], [place], [place] [address] Phone: [phone]) [phone]; Fax: [phone]

## 2016-03-07 NOTE — Patient Instructions (Signed)
Your physician recommends that you schedule a follow-up appointment in: 3 Months   Your physician recommends that you continue on your current medications as directed. Please refer to the Current Medication list given to you today.  If you need a refill on your cardiac medications before your next appointment, please call your pharmacy.  Thank you for choosing [facility]!    

## 2016-03-27 ENCOUNTER — Telehealth: Payer: Self-pay | Admitting: Adult Health

## 2016-03-27 NOTE — Telephone Encounter (Signed)
error 

## 2016-04-03 ENCOUNTER — Other Ambulatory Visit: Payer: Self-pay | Admitting: Nurse Practitioner

## 2016-04-09 ENCOUNTER — Encounter (HOSPITAL_COMMUNITY): Admission: RE | Admit: 2016-04-09 | Payer: Medicare Other | Source: Ambulatory Visit

## 2016-04-12 ENCOUNTER — Encounter (HOSPITAL_COMMUNITY)
Admission: RE | Admit: 2016-04-12 | Discharge: 2016-04-12 | Disposition: A | Discharge status: Home / Self Care | Payer: Medicare Other | Source: Ambulatory Visit | Attending: Nephrology | Admitting: Nephrology

## 2016-04-12 DIAGNOSIS — D509 Iron deficiency anemia, unspecified: Secondary | ICD-10-CM | POA: Diagnosis present

## 2016-04-12 MED ORDER — SODIUM CHLORIDE 0.9 % IV SOLN
Freq: Once | INTRAVENOUS | Status: AC
  Administered 2016-04-12: 250 mL via INTRAVENOUS

## 2016-04-12 MED ORDER — SODIUM CHLORIDE 0.9 % IV SOLN
510.0000 mg | Freq: Once | INTRAVENOUS | Status: AC
  Administered 2016-04-12: 510 mg via INTRAVENOUS
  Filled 2016-04-12: qty 17

## 2016-04-19 ENCOUNTER — Encounter (HOSPITAL_COMMUNITY)
Admission: RE | Admit: 2016-04-19 | Discharge: 2016-04-19 | Disposition: A | Discharge status: Home / Self Care | Payer: Medicare Other | Source: Ambulatory Visit | Attending: Nephrology | Admitting: Nephrology

## 2016-04-19 DIAGNOSIS — D509 Iron deficiency anemia, unspecified: Secondary | ICD-10-CM | POA: Diagnosis not present

## 2016-04-19 MED ORDER — SODIUM CHLORIDE 0.9 % IV SOLN
INTRAVENOUS | Status: DC
  Administered 2016-04-19: 250 mL via INTRAVENOUS

## 2016-04-19 MED ORDER — SODIUM CHLORIDE 0.9 % IV SOLN
510.0000 mg | Freq: Once | INTRAVENOUS | Status: AC
  Administered 2016-04-19: 510 mg via INTRAVENOUS
  Filled 2016-04-19: qty 17

## 2016-05-06 ENCOUNTER — Telehealth: Payer: Self-pay | Admitting: Adult Health

## 2016-05-06 MED ORDER — RANITIDINE HCL 150 MG PO TABS: 150.0000 mg | ORAL_TABLET | Freq: Every day | ORAL | Status: DC

## 2016-05-06 NOTE — Telephone Encounter (Signed)
Patient was place on Omeprazole and then Protonix while in the hospital by cardiology. She now c/o nausea and vomiting that comes and goes and states other PPI's have not helped her.She is daibetic and states blood sugars are "good",still has her gallbladder.I asked her if she had reached out to her pcp regarding this and she stated no since we prescribed the medication.

## 2016-05-06 NOTE — Telephone Encounter (Signed)
Patient states that she has been having  Nausea and vomitting and she thinks it is coming from new medication. / tg

## 2016-05-06 NOTE — Telephone Encounter (Signed)
Stop protonix. Begin Zantac 150 mg daily

## 2016-05-06 NOTE — Telephone Encounter (Signed)
Notified pt,escribed rx

## 2016-05-23 ENCOUNTER — Encounter (HOSPITAL_COMMUNITY)
Admission: RE | Admit: 2016-05-23 | Discharge: 2016-05-23 | Disposition: A | Discharge status: Home / Self Care | Payer: Medicare Other | Source: Ambulatory Visit | Attending: Cardiology | Admitting: Cardiology

## 2016-05-23 VITALS — BP 130/70 | HR 74 | Ht 65.0 in | Wt 211.9 lb

## 2016-05-23 DIAGNOSIS — I214 Non-ST elevation (NSTEMI) myocardial infarction: Secondary | ICD-10-CM | POA: Insufficient documentation

## 2016-05-23 DIAGNOSIS — Z955 Presence of coronary angioplasty implant and graft: Secondary | ICD-10-CM

## 2016-05-23 NOTE — Progress Notes (Signed)
Cardiac/Pulmonary Rehab Medication Review by a Pharmacist  Does the patient  feel that his/her medications are working for him/her?  yes  Has the patient been experiencing any side effects to the medications prescribed?  No.  She was having some issues with protonix but this was changed to zantac.  Does the patient measure his/her own blood pressure or blood glucose at home?  yes   Does the patient have any problems obtaining medications due to transportation or finances?   No.  She has a lot on her plate right now as she is seeing 4 specialists  Understanding of regimen: excellent Understanding of indications: excellent Potential of compliance: excellent     Brunetta GeneraSeay, Zakee Deerman Poteet 05/23/2016 3:24 PM

## 2016-05-24 ENCOUNTER — Other Ambulatory Visit: Payer: Self-pay

## 2016-05-24 DIAGNOSIS — Z1231 Encounter for screening mammogram for malignant neoplasm of breast: Secondary | ICD-10-CM

## 2016-05-27 ENCOUNTER — Encounter (HOSPITAL_COMMUNITY)
Admission: RE | Admit: 2016-05-27 | Discharge: 2016-05-27 | Disposition: A | Discharge status: Home / Self Care | Payer: Medicare Other | Source: Ambulatory Visit | Attending: Cardiology | Admitting: Cardiology

## 2016-05-27 ENCOUNTER — Other Ambulatory Visit: Payer: Self-pay | Admitting: "Endocrinology

## 2016-05-27 DIAGNOSIS — I214 Non-ST elevation (NSTEMI) myocardial infarction: Secondary | ICD-10-CM

## 2016-05-27 DIAGNOSIS — Z955 Presence of coronary angioplasty implant and graft: Secondary | ICD-10-CM

## 2016-05-27 LAB — HEMOGLOBIN A1C
HEMOGLOBIN A1C: 7.8 % — AB (ref <5.7)
MEAN PLASMA GLUCOSE: 177 mg/dL

## 2016-05-27 LAB — COMPREHENSIVE METABOLIC PANEL
ALBUMIN: 3.8 g/dL (ref 3.6–5.1)
ALK PHOS: 101 U/L (ref 33–130)
ALT: 19 U/L (ref 6–29)
AST: 19 U/L (ref 10–35)
BILIRUBIN TOTAL: 0.4 mg/dL (ref 0.2–1.2)
BUN: 19 mg/dL (ref 7–25)
CALCIUM: 9.2 mg/dL (ref 8.6–10.4)
CO2: 25 mmol/L (ref 20–31)
Chloride: 102 mmol/L (ref 98–110)
Creat: 1.55 mg/dL — ABNORMAL HIGH (ref 0.50–0.99)
Glucose, Bld: 195 mg/dL — ABNORMAL HIGH (ref 65–99)
Potassium: 4.7 mmol/L (ref 3.5–5.3)
Sodium: 138 mmol/L (ref 135–146)
TOTAL PROTEIN: 6.2 g/dL (ref 6.1–8.1)

## 2016-05-27 NOTE — Progress Notes (Signed)
Daily Session Note  Patient Details  Name: Teresa Hart MRN: [id number] Date of Birth: 09/05/1951 Referring Provider:        CARDIAC REHAB PHASE II ORIENTATION from 05/23/2016 in [place]   Referring Provider  Gwenlyn Found      Encounter Date: 05/27/2016  Check In:     Session Check In - 05/27/16 1100    Check-In   Location [facility]   Staff Present Diane Angelina Pih, MS, EP, [facility], Exercise Physiologist;Katelinn Justice Luther Parody, BS, EP, Exercise Physiologist;Debra Wynetta Emery, RN, BSN   Supervising physician immediately available to respond to emergencies See telemetry face sheet for immediately available MD   Medication changes reported     No   Fall or balance concerns reported    No   Warm-up and Cool-down Performed as group-led instruction   Resistance Training Performed Yes   VAD Patient? No   Pain Assessment   Currently in Pain? No/denies   Pain Score 0-No pain   Multiple Pain Sites No      Capillary Blood Glucose: No results found for this or any previous visit (from the past 24 hour(s)).   Goals Met:  Independence with exercise equipment Exercise tolerated well No report of cardiac concerns or symptoms Strength training completed today  Goals Unmet:  Not Applicable  Comments: Check out 1200   Dr. Kate Sable is Medical Director for [place] and Pulmonary Rehab.

## 2016-05-29 ENCOUNTER — Encounter (HOSPITAL_COMMUNITY)
Admission: RE | Admit: 2016-05-29 | Discharge: 2016-05-29 | Disposition: A | Discharge status: Home / Self Care | Payer: Medicare Other | Source: Ambulatory Visit | Attending: Cardiology | Admitting: Cardiology

## 2016-05-29 DIAGNOSIS — Z955 Presence of coronary angioplasty implant and graft: Secondary | ICD-10-CM

## 2016-05-29 DIAGNOSIS — I214 Non-ST elevation (NSTEMI) myocardial infarction: Secondary | ICD-10-CM | POA: Diagnosis not present

## 2016-05-29 NOTE — Progress Notes (Signed)
Daily Session Note  Patient Details  Name: Teresa Hart MRN: [id number] Date of Birth: 06/06/1951 Referring Provider:        CARDIAC REHAB PHASE II ORIENTATION from 05/23/2016 in [place]   Referring Provider  Gwenlyn Found      Encounter Date: 05/29/2016  Check In:     Session Check In - 05/29/16 1100    Check-In   Location [facility]   Staff Present Suzanne Boron, BS, EP, Exercise Physiologist;Ayden Apodaca Wynetta Emery, RN, BSN   Supervising physician immediately available to respond to emergencies See telemetry face sheet for immediately available MD   Medication changes reported     No   Fall or balance concerns reported    No   Warm-up and Cool-down Performed as group-led instruction   Resistance Training Performed Yes   VAD Patient? No   Pain Assessment   Currently in Pain? No/denies   Pain Score 0-No pain   Multiple Pain Sites No      Capillary Blood Glucose: No results found for this or any previous visit (from the past 24 hour(s)).   Goals Met:  Independence with exercise equipment Exercise tolerated well No report of cardiac concerns or symptoms Strength training completed today  Goals Unmet:  Not Applicable  Comments: Check out 1200.   Dr. Kate Sable is Medical Director for [facility] Cardiac and Pulmonary Rehab.

## 2016-05-31 ENCOUNTER — Encounter (HOSPITAL_COMMUNITY)
Admission: RE | Admit: 2016-05-31 | Discharge: 2016-05-31 | Disposition: A | Discharge status: Home / Self Care | Payer: Medicare Other | Source: Ambulatory Visit | Attending: Cardiology | Admitting: Cardiology

## 2016-05-31 DIAGNOSIS — I214 Non-ST elevation (NSTEMI) myocardial infarction: Secondary | ICD-10-CM | POA: Diagnosis not present

## 2016-05-31 DIAGNOSIS — Z955 Presence of coronary angioplasty implant and graft: Secondary | ICD-10-CM

## 2016-05-31 NOTE — Progress Notes (Signed)
Daily Session Note  Patient Details  Name: Teresa Hart MRN: [id number] Date of Birth: 23-Jun-1951 Referring Provider:        [place] from 05/23/2016 in [place]   Referring Provider  Gwenlyn Found      Encounter Date: 05/31/2016  Check In:     Session Check In - 05/31/16 1119    Check-In   Location [facility]   Staff Present Suzanne Boron, BS, EP, Exercise Physiologist;Debra Wynetta Emery, RN, Sharlot Gowda, MS, EP, [facility], Exercise Physiologist   Supervising physician immediately available to respond to emergencies See telemetry face sheet for immediately available MD   Medication changes reported     No   Fall or balance concerns reported    No   Warm-up and Cool-down Performed as group-led instruction   Resistance Training Performed Yes   VAD Patient? No   Pain Assessment   Pain Score 0-No pain   Multiple Pain Sites No      Capillary Blood Glucose: No results found for this or any previous visit (from the past 24 hour(s)).   Goals Met:  Independence with exercise equipment Exercise tolerated well No report of cardiac concerns or symptoms Strength training completed today  Goals Unmet:  Not Applicable  Comments: Check out: 1200   Dr. Kate Sable is Medical Director for [place] and Pulmonary Rehab.

## 2016-06-03 ENCOUNTER — Other Ambulatory Visit: Payer: Self-pay | Admitting: Physician Assistant

## 2016-06-03 ENCOUNTER — Encounter (HOSPITAL_COMMUNITY)
Admission: RE | Admit: 2016-06-03 | Discharge: 2016-06-03 | Disposition: A | Discharge status: Home / Self Care | Payer: Medicare Other | Source: Ambulatory Visit | Attending: Cardiology | Admitting: Cardiology

## 2016-06-03 DIAGNOSIS — Z955 Presence of coronary angioplasty implant and graft: Secondary | ICD-10-CM

## 2016-06-03 DIAGNOSIS — I214 Non-ST elevation (NSTEMI) myocardial infarction: Secondary | ICD-10-CM | POA: Diagnosis not present

## 2016-06-03 DIAGNOSIS — N63 Unspecified lump in unspecified breast: Secondary | ICD-10-CM

## 2016-06-03 NOTE — Progress Notes (Signed)
Daily Session Note  Patient Details  Name: Teresa Hart MRN: [id number] Date of Birth: 10/18/1951 Referring Provider:        CARDIAC REHAB PHASE II ORIENTATION from 05/23/2016 in [place]   Referring Provider  Gwenlyn Found      Encounter Date: 06/03/2016  Check In:     Session Check In - 06/03/16 1100    Check-In   Location [facility]   Staff Present Diane Angelina Pih, MS, EP, [facility], Exercise Physiologist;Joshu Furukawa Luther Parody, BS, EP, Exercise Physiologist;Debra Wynetta Emery, RN, BSN   Supervising physician immediately available to respond to emergencies See telemetry face sheet for immediately available MD   Medication changes reported     No   Fall or balance concerns reported    No   Warm-up and Cool-down Performed as group-led instruction   Resistance Training Performed Yes   VAD Patient? No   Pain Assessment   Currently in Pain? No/denies   Pain Score 0-No pain   Multiple Pain Sites No      Capillary Blood Glucose: No results found for this or any previous visit (from the past 24 hour(s)).   Goals Met:  Independence with exercise equipment Exercise tolerated well No report of cardiac concerns or symptoms Strength training completed today  Goals Unmet:  Not Applicable  Comments: Check out 1200   Dr. Kate Sable is Medical Director for [place] and Pulmonary Rehab.

## 2016-06-05 ENCOUNTER — Encounter (HOSPITAL_COMMUNITY)
Admission: RE | Admit: 2016-06-05 | Discharge: 2016-06-05 | Disposition: A | Discharge status: Home / Self Care | Payer: Medicare Other | Source: Ambulatory Visit | Attending: Cardiology | Admitting: Cardiology

## 2016-06-05 ENCOUNTER — Ambulatory Visit (INDEPENDENT_AMBULATORY_CARE_PROVIDER_SITE_OTHER): Payer: Medicare Other | Admitting: "Endocrinology

## 2016-06-05 ENCOUNTER — Encounter: Payer: Self-pay | Admitting: "Endocrinology

## 2016-06-05 VITALS — BP 134/76 | HR 81 | Ht 65.0 in | Wt 211.0 lb

## 2016-06-05 DIAGNOSIS — I10 Essential (primary) hypertension: Secondary | ICD-10-CM | POA: Diagnosis not present

## 2016-06-05 DIAGNOSIS — N183 Chronic kidney disease, stage 3 unspecified: Secondary | ICD-10-CM

## 2016-06-05 DIAGNOSIS — E785 Hyperlipidemia, unspecified: Secondary | ICD-10-CM

## 2016-06-05 DIAGNOSIS — E1159 Type 2 diabetes mellitus with other circulatory complications: Secondary | ICD-10-CM | POA: Diagnosis not present

## 2016-06-05 DIAGNOSIS — Z955 Presence of coronary angioplasty implant and graft: Secondary | ICD-10-CM

## 2016-06-05 DIAGNOSIS — E1122 Type 2 diabetes mellitus with diabetic chronic kidney disease: Secondary | ICD-10-CM

## 2016-06-05 DIAGNOSIS — I214 Non-ST elevation (NSTEMI) myocardial infarction: Secondary | ICD-10-CM

## 2016-06-05 MED ORDER — LIRAGLUTIDE 18 MG/3ML ~~LOC~~ SOPN: 1.8000 mg | PEN_INJECTOR | Freq: Every day | SUBCUTANEOUS | Status: DC

## 2016-06-05 MED ORDER — INSULIN LISPRO 100 UNIT/ML (KWIKPEN): 18.0000 [IU] | PEN_INJECTOR | Freq: Three times a day (TID) | SUBCUTANEOUS | Status: DC

## 2016-06-05 MED ORDER — INSULIN GLARGINE 100 UNIT/ML SOLOSTAR PEN: 90.0000 [IU] | PEN_INJECTOR | Freq: Every day | SUBCUTANEOUS | Status: DC

## 2016-06-05 NOTE — Patient Instructions (Signed)

## 2016-06-05 NOTE — Progress Notes (Signed)
Daily Session Note  Patient Details  Name: Teresa Hart MRN: [id number] Date of Birth: Jul 26, 1951 Referring Provider:        CARDIAC REHAB PHASE II ORIENTATION from 05/23/2016 in [place]   Referring Provider  Gwenlyn Found      Encounter Date: 06/05/2016  Check In:     Session Check In - 06/05/16 1100    Check-In   Location [facility]   Staff Present Diane Angelina Pih, MS, EP, [facility], Exercise Physiologist;Vear Staton Luther Parody, BS, EP, Exercise Physiologist;Debra Wynetta Emery, RN, BSN   Supervising physician immediately available to respond to emergencies See telemetry face sheet for immediately available MD   Medication changes reported     No   Fall or balance concerns reported    No   Warm-up and Cool-down Performed as group-led instruction   Resistance Training Performed Yes   VAD Patient? No   Pain Assessment   Currently in Pain? No/denies   Pain Score 0-No pain   Multiple Pain Sites No      Capillary Blood Glucose: No results found for this or any previous visit (from the past 24 hour(s)).      Exercise Prescription Changes - 06/04/16 1200    Exercise Review   Progression Yes   Response to Exercise   Blood Pressure (Admit) 126/60 mmHg   Blood Pressure (Exercise) 164/52 mmHg   Blood Pressure (Exit) 126/66 mmHg   Heart Rate (Admit) 92 bpm   Heart Rate (Exercise) 96 bpm   Heart Rate (Exit) 87 bpm   Rating of Perceived Exertion (Exercise) 10   Symptoms None   Duration Progress to 30 minutes of continuous aerobic without signs/symptoms of physical distress   Intensity Rest + 30   Progression   Progression Continue to progress workloads to maintain intensity without signs/symptoms of physical distress.   Resistance Training   Training Prescription Yes   Weight 1   Reps 10-12   Treadmill   MPH 1.5   Grade 0   Minutes 15   METs 2.1   Elliptical   Level 2   Speed 43   Minutes 20   METs 3.64   Home Exercise Plan   Plans to continue  exercise at Home   Frequency Add 2 additional days to program exercise sessions.     Goals Met:  Independence with exercise equipment Exercise tolerated well No report of cardiac concerns or symptoms Strength training completed today  Goals Unmet:  Not Applicable  Comments: Check out 1200   Dr. Kate Sable is Medical Director for [place] and Pulmonary Rehab.

## 2016-06-05 NOTE — Progress Notes (Signed)
Subjective:    Patient ID: Teresa Hart, female    DOB: [birth year], PCP [facility]   Past Medical History  Diagnosis Date  . Hypertensive heart disease   . Hyperlipidemia   . GERD (gastroesophageal reflux disease)   . Obese   . Type I diabetes mellitus (HCC)   . Hypothyroid   . Asthma   . Cardiac enlargement   . Aortic stenosis, mild     a. per echo Jan 2013; EF is 61%  . Carotid stenosis, bilateral     a. per doppler Jan 2013; 60-79% RICA, 40-59% LICA  . CAD (coronary artery disease)     a. 11/2011 abnl MV; b. 11/2011 Cath: No obstructive disease in the large caliber vessels, disease in the small OM2 and distal LAD, not amenable to PCI or grafting; c. 01/2016 PCI: LM nl, LAD 30ost, 43m, LCX nl, OM1 small, OM2 90 diff, OM3 small, RCA  95p/m 2.75 x 24 Promus Premier DES, 48m. Plan staged PCI of OM2.  . CKD (chronic kidney disease), stage III    Past Surgical History  Procedure Laterality Date  . Abdominal hysterectomy    . Tonsillectomy    . Knee surgery    . Appendectomy    . Cardiac catheterization  Jan 2013    No major obstructive disease in the large caliber vessels, with disease in a small OM2 and distal LAD; not amenable to PCI or grafting.   . Knee arthroscopy  06/24/2012    Procedure: ARTHROSCOPY KNEE;  Surgeon: Nestor Lewandowsky, MD;  Location: [facility];  Service: Orthopedics;  Laterality: Right;  DEBRIDEMENT OF CHONDROMALACIA  . Cardiac catheterization N/A 02/05/2016    Procedure: Left Heart Cath and Coronary Angiography;  Surgeon: Peter M Swaziland, MD;  Location: [facility] INVASIVE CV LAB;  Service: Cardiovascular;  Laterality: N/A;  . Cardiac catheterization  02/05/2016    Procedure: Coronary Stent Intervention;  Surgeon: Peter M Swaziland, MD;  Location: [facility] INVASIVE CV LAB;  Service: Cardiovascular;;  . Cardiac catheterization N/A 02/20/2016    Procedure: Coronary Stent Intervention;  Surgeon: Lyn Records, MD;  Location: [facility] INVASIVE CV  LAB;  Service: Cardiovascular;  Laterality: N/A;   Social History   Social History  . Marital Status: Married    Spouse Name: N/A  . Number of Children: 0  . Years of Education: N/A   Occupational History  . Disabled    Social History Main Topics  . Smoking status: Never Smoker   . Smokeless tobacco: Never Used  . Alcohol Use: No  . Drug Use: No  . Sexual Activity: Yes   Other Topics Concern  . None   Social History Narrative   Lives with husband, disabled from multiple medical issues.   Outpatient Encounter Prescriptions as of 06/05/2016  Medication Sig  . ACCU-CHEK AVIVA PLUS test strip CHECK BLOOD SUGAR 4 TIMES DAILY.  Marland Kitchen albuterol (PROVENTIL HFA;VENTOLIN HFA) 108 (90 BASE) MCG/ACT inhaler Inhale 2 puffs into the lungs every 6 (six) hours as needed.  . ARIPiprazole (ABILIFY) 20 MG tablet Take 10 mg by mouth every evening.   Marland Kitchen aspirin EC 81 MG tablet Take 1 tablet (81 mg total) by mouth daily.  Marland Kitchen atorvastatin (LIPITOR) 80 MG tablet Take 1 tablet (80 mg total) by mouth daily at 6 PM.  . carvedilol (COREG) 25 MG tablet Take 1 tablet (25 mg total) by mouth 2 (two) times daily with a meal.  .  clopidogrel (PLAVIX) 75 MG tablet Take 1 tablet (75 mg total) by mouth daily.  . diazepam (VALIUM) 5 MG tablet Take 5 mg by mouth every 6 (six) hours as needed for anxiety.  . hydrALAZINE (APRESOLINE) 50 MG tablet Take 1 tablet (50 mg total) by mouth 3 (three) times daily.  . Insulin Glargine (LANTUS SOLOSTAR) 100 UNIT/ML Solostar Pen Inject 90 Units into the skin at bedtime.  . insulin lispro (HUMALOG) 100 UNIT/ML KiwkPen Inject 0.18-0.24 mLs (18-24 Units total) into the skin 3 (three) times daily with meals.  . Insulin Pen Needle (B-D ULTRAFINE III SHORT PEN) 31G X 8 MM MISC 1 each by Does not apply route as directed.  Marland Kitchen levocetirizine (XYZAL) 5 MG tablet Take 1 tablet (5 mg total) by mouth every evening.  . Liraglutide (VICTOZA) 18 MG/3ML SOPN Inject 0.3 mLs (1.8 mg total) into the skin  at bedtime.  Marland Kitchen LORazepam (ATIVAN) 1 MG tablet Take 1 mg by mouth as needed for anxiety or sleep.   . nitroGLYCERIN (NITROSTAT) 0.4 MG SL tablet Place 1 tablet (0.4 mg total) under the tongue every 5 (five) minutes as needed for chest pain.  . ranitidine (ZANTAC) 150 MG tablet Take 1 tablet (150 mg total) by mouth daily.  . Vitamin D, Ergocalciferol, (DRISDOL) 50000 units CAPS capsule Take 1 capsule (50,000 Units total) by mouth every 7 (seven) days.  . Vortioxetine HBr (TRINTELLIX) 20 MG TABS Take 20 mg by mouth daily.   . [DISCONTINUED] Insulin Glargine (LANTUS SOLOSTAR) 100 UNIT/ML Solostar Pen Inject 90 Units into the skin at bedtime.  . [DISCONTINUED] insulin lispro (HUMALOG) 100 UNIT/ML KiwkPen Inject 0.18-0.24 mLs (18-24 Units total) into the skin 3 (three) times daily with meals.  . [DISCONTINUED] Liraglutide (VICTOZA) 18 MG/3ML SOPN Inject 0.3 mLs (1.8 mg total) into the skin at bedtime.   No facility-administered encounter medications on file as of 06/05/2016.   ALLERGIES: Allergies  Allergen Reactions  . Brilinta [Ticagrelor] Anaphylaxis  . Amoxicillin-Pot Clavulanate Nausea Only  . Crestor [Rosuvastatin]     Muscle spasms and cramps  . Codeine     REACTION: nausea  . Penicillins     REACTION: major hives,swelling   VACCINATION STATUS: Immunization History  Administered Date(s) Administered  . Influenza Split 08/05/2012  . Tdap 03/15/2013  . Zoster 03/16/2013    Diabetes She presents for her follow-up diabetic visit. She has type 2 diabetes mellitus. Onset time: She was diagnosed at approximate age of 46 years. Her disease course has been stable. There are no hypoglycemic associated symptoms. Pertinent negatives for hypoglycemia include no confusion, headaches, pallor or seizures. There are no diabetic associated symptoms. Pertinent negatives for diabetes include no chest pain, no polydipsia, no polyphagia and no polyuria. There are no hypoglycemic complications. Symptoms  are stable. Diabetic complications include heart disease and nephropathy. Risk factors for coronary artery disease include diabetes mellitus, dyslipidemia, hypertension, sedentary lifestyle and obesity. Current diabetic treatment includes intensive insulin program. She is compliant with treatment most of the time. Her weight is stable. She is following a generally unhealthy diet. When asked about meal planning, she reported none. She has had a previous visit with a dietitian. She rarely participates in exercise. Her home blood glucose trend is decreasing steadily. Her breakfast blood glucose range is generally 140-180 mg/dl. Her lunch blood glucose range is generally 140-180 mg/dl. Her dinner blood glucose range is generally 140-180 mg/dl. Her overall blood glucose range is 140-180 mg/dl. Eye exam is current.  Hyperlipidemia This is  a chronic problem. The current episode started more than 1 year ago. The problem is uncontrolled. Recent lipid tests were reviewed and are high. Exacerbating diseases include diabetes and obesity. Associated symptoms include leg pain. Pertinent negatives include no chest pain, myalgias or shortness of breath. Current antihyperlipidemic treatment includes statins. The current treatment provides no improvement of lipids. Risk factors for coronary artery disease include diabetes mellitus, dyslipidemia, hypertension, obesity and a sedentary lifestyle.  Hypertension This is a chronic problem. The current episode started more than 1 year ago. The problem is controlled. Pertinent negatives include no chest pain, headaches, palpitations or shortness of breath. Risk factors for coronary artery disease include dyslipidemia, diabetes mellitus, obesity and sedentary lifestyle. Past treatments include direct vasodilators. Hypertensive end-organ damage includes kidney disease and CAD/MI.     Review of Systems  Constitutional: Negative for fever, chills and unexpected weight change.  HENT:  Negative for trouble swallowing and voice change.   Eyes: Negative for visual disturbance.  Respiratory: Negative for cough, shortness of breath and wheezing.   Cardiovascular: Negative for chest pain, palpitations and leg swelling.  Gastrointestinal: Negative for nausea, vomiting and diarrhea.  Endocrine: Negative for cold intolerance, heat intolerance, polydipsia, polyphagia and polyuria.  Musculoskeletal: Negative for myalgias and arthralgias.  Skin: Negative for color change, pallor, rash and wound.  Neurological: Negative for seizures and headaches.  Psychiatric/Behavioral: Negative for suicidal ideas and confusion.    Objective:    BP 134/76 mmHg  Pulse 81  Ht 5\' 5"  (1.651 m)  Wt 211 lb (95.709 kg)  BMI 35.11 kg/m2  Wt Readings from Last 3 Encounters:  06/05/16 211 lb (95.709 kg)  05/24/16 211 lb 13.8 oz (96.1 kg)  03/07/16 214 lb (97.07 kg)    Physical Exam  Constitutional: She is oriented to person, place, and time. She appears well-developed.  HENT:  Head: Normocephalic and atraumatic.  Eyes: EOM are normal.  Neck: Normal range of motion. Neck supple. No tracheal deviation present. No thyromegaly present.  Cardiovascular: Normal rate and regular rhythm.   Pulmonary/Chest: Effort normal and breath sounds normal.  Abdominal: Soft. Bowel sounds are normal. There is no tenderness. There is no guarding.  Musculoskeletal: Normal range of motion. She exhibits no edema.  Neurological: She is alert and oriented to person, place, and time. She has normal reflexes. No cranial nerve deficit. Coordination normal.  Skin: Skin is warm and dry. No rash noted. No erythema. No pallor.  Psychiatric: She has a normal mood and affect. Judgment normal.     CMP ( most recent) CMP     Component Value Date/Time   NA 138 05/27/2016 0905   K 4.7 05/27/2016 0905   CL 102 05/27/2016 0905   CO2 25 05/27/2016 0905   GLUCOSE 195* 05/27/2016 0905   BUN 19 05/27/2016 0905   CREATININE 1.55*  05/27/2016 0905   CREATININE 1.57* 02/13/2016 1430   CREATININE 1.42* 10/01/2010 1344   CALCIUM 9.2 05/27/2016 0905   PROT 6.2 05/27/2016 0905   ALBUMIN 3.8 05/27/2016 0905   AST 19 05/27/2016 0905   ALT 19 05/27/2016 0905   ALKPHOS 101 05/27/2016 0905   BILITOT 0.4 05/27/2016 0905   GFRNONAA 36* 02/22/2016 0818   GFRNONAA 34* 02/13/2016 1430   GFRAA 41* 02/22/2016 0818   GFRAA 39* 02/13/2016 1430     Diabetic Labs (most recent): Lab Results  Component Value Date   HGBA1C 7.8* 05/27/2016   HGBA1C 7.7* 02/02/2016   HGBA1C 8.5 03/15/2013  Lipid Panel ( most recent) Lipid Panel     Component Value Date/Time   CHOL 332* 02/03/2016 0317   TRIG 311* 02/03/2016 0317   HDL 48 02/03/2016 0317   CHOLHDL 6.9 02/03/2016 0317   VLDL 62* 02/03/2016 0317   LDLCALC 222* 02/03/2016 0317      Assessment & Plan:   1. Diabetes mellitus with stage 3 chronic kidney disease (HCC)  -her diabetes is  complicated by  Coronary artery disease which required stent placement since her last visit, stage 3 chronic kidney disease and patient remains at a high risk for more acute and chronic complications of diabetes which include CAD, CVA, CKD, retinopathy, and neuropathy. These are all discussed in detail with the patient.  Patient came with stable glucose profile, and  recent A1c of 7.8% generally improving from 9.1 %.  Glucose logs and insulin administration records pertaining to this visit,  to be scanned into patient's records.  Recent labs reviewed.   - I have re-counseled the patient on diet management and weight loss  by adopting a carbohydrate restricted / protein rich  Diet.  - Suggestion is made for patient to avoid simple carbohydrates   from their diet including Cakes , Desserts, Ice Cream,  Soda (  diet and regular) , Sweet Tea , Candies,  Chips, Cookies, Artificial Sweeteners,   and "Sugar-free" Products .  This will help patient to have stable blood glucose profile and potentially  avoid unintended  Weight gain.  - Patient is advised to stick to a routine mealtimes to eat 3 meals  a day and avoid unnecessary snacks ( to snack only to correct hypoglycemia).  - The patient  has been  scheduled with Norm Salt, RDN, CDE for individualized DM education.  - I have approached patient with the following individualized plan to manage diabetes and patient agrees.  - I will proceed with basal insulin Lantus 90 units QHS, and prandial insulin Humalog 18 units TIDAC for pre-meal BG readings of 90-150mg /dl, plus patient specific correction dose of rapid acting insulin  for unexpected hyperglycemia above 150mg /dl, associated with strict monitoring of glucose  AC and HS. - Patient is warned not to take insulin without proper monitoring per orders. -Adjustment parameters are given for hypo and hyperglycemia in writing. -Patient is encouraged to call clinic for blood glucose levels less than 70 or above 300 mg /dl. - I will continue Victoza 1.8 mg subcutaneously daily, therapeutically suitable for patient. -Patient is not a candidate for metformin and insert inhibitors due to CKD. -Since she has achieved reasonable control of diabetes with A1c of 7.4%, she would not need U500 insulin for now. - Patient specific target  for A1c; LDL, HDL, Triglycerides, and  Waist Circumference were discussed in detail.  2) BP/HTN: Controlled. Continue current medications including ACEI/ARB. 3) Lipids/HPL: she has severe Uncontrolled hyperlipidemia, LDL at 222. continue statins. I reminded her that her Lipitor is at maximum dose of 80 mg by mouth daily at bedtime.  4)  Weight/Diet: CDE consult in progress, exercise, and carbohydrates information provided.  5) Chronic Care/Health Maintenance:  -Patient is on ACEI/ARB and Statin medications and encouraged to continue to follow up with Ophthalmology, Podiatrist at least yearly or according to recommendations, and advised to  stay away from smoking. I  have recommended yearly flu vaccine and pneumonia vaccination at least every 5 years; moderate intensity exercise for up to 150 minutes weekly; and  sleep for at least 7 hours a day.  -  25 minutes of time was spent on the care of this patient , 50% of which was applied for counseling on diabetes complications and their preventions.  - I advised patient [facility].  Patient is asked to bring meter and  blood glucose logs during their next visit.   Follow up plan: -Return in about 3 months (around 09/05/2016) for follow up with pre-visit labs, meter, and logs.  Marquis LunchGebre Nida, MD Phone: [phone]  Fax: [phone]   06/05/2016, 4:19 PM

## 2016-06-06 ENCOUNTER — Ambulatory Visit
Admission: RE | Admit: 2016-06-06 | Discharge: 2016-06-06 | Disposition: A | Discharge status: Home / Self Care | Payer: Medicare Other | Source: Ambulatory Visit | Attending: Physician Assistant | Admitting: Physician Assistant

## 2016-06-06 ENCOUNTER — Ambulatory Visit: Payer: Medicare Other | Admitting: "Endocrinology

## 2016-06-06 DIAGNOSIS — N63 Unspecified lump in unspecified breast: Secondary | ICD-10-CM

## 2016-06-07 ENCOUNTER — Encounter (HOSPITAL_COMMUNITY)
Admission: RE | Admit: 2016-06-07 | Discharge: 2016-06-07 | Disposition: A | Discharge status: Home / Self Care | Payer: Medicare Other | Source: Ambulatory Visit | Attending: Cardiology | Admitting: Cardiology

## 2016-06-07 DIAGNOSIS — I214 Non-ST elevation (NSTEMI) myocardial infarction: Secondary | ICD-10-CM | POA: Diagnosis not present

## 2016-06-07 DIAGNOSIS — Z955 Presence of coronary angioplasty implant and graft: Secondary | ICD-10-CM

## 2016-06-07 NOTE — Progress Notes (Signed)
Daily Session Note  Patient Details  Name: Teresa Hart MRN: [id number] Date of Birth: October 21, 1951 Referring Provider:        CARDIAC REHAB PHASE II ORIENTATION from 05/23/2016 in [place]   Referring Provider  Gwenlyn Found      Encounter Date: 06/07/2016  Check In:     Session Check In - 06/07/16 1100    Check-In   Location [facility]   Staff Present Diane Angelina Pih, MS, EP, [facility], Exercise Physiologist;Delano Scardino Luther Parody, BS, EP, Exercise Physiologist;Debra Wynetta Emery, RN, BSN   Supervising physician immediately available to respond to emergencies See telemetry face sheet for immediately available MD   Medication changes reported     No   Fall or balance concerns reported    No   Warm-up and Cool-down Performed as group-led instruction   Resistance Training Performed Yes   VAD Patient? No   Pain Assessment   Currently in Pain? No/denies   Pain Score 0-No pain   Multiple Pain Sites No      Capillary Blood Glucose: No results found for this or any previous visit (from the past 24 hour(s)).   Goals Met:  Independence with exercise equipment Exercise tolerated well No report of cardiac concerns or symptoms Strength training completed today  Goals Unmet:  Not Applicable  Comments: Check out 1200   Dr. Kate Sable is Medical Director for [place] and Pulmonary Rehab.

## 2016-06-10 ENCOUNTER — Encounter: Payer: Self-pay | Admitting: Cardiology

## 2016-06-10 ENCOUNTER — Encounter (HOSPITAL_COMMUNITY)
Admission: RE | Admit: 2016-06-10 | Discharge: 2016-06-10 | Disposition: A | Discharge status: Home / Self Care | Payer: Medicare Other | Source: Ambulatory Visit | Attending: Cardiology | Admitting: Cardiology

## 2016-06-10 ENCOUNTER — Other Ambulatory Visit: Payer: Self-pay | Admitting: Physician Assistant

## 2016-06-10 DIAGNOSIS — Z955 Presence of coronary angioplasty implant and graft: Secondary | ICD-10-CM

## 2016-06-10 DIAGNOSIS — I214 Non-ST elevation (NSTEMI) myocardial infarction: Secondary | ICD-10-CM

## 2016-06-10 DIAGNOSIS — Z1231 Encounter for screening mammogram for malignant neoplasm of breast: Secondary | ICD-10-CM

## 2016-06-10 NOTE — Progress Notes (Signed)
Daily Session Note  Patient Details  Name: Teresa Hart MRN: [id number] Date of Birth: [birth year] Referring Provider:   Flowsheet Row CARDIAC REHAB PHASE II ORIENTATION from 05/23/2016 in [place]  Referring Provider  Gwenlyn Found      Encounter Date: 06/10/2016  Check In:     Session Check In - 06/10/16 1100      Check-In   Location [facility]   Staff Present Diane Angelina Pih, MS, EP, [facility], Exercise Physiologist;Gregory Luther Parody, BS, EP, Exercise Physiologist;Talayah Picardi Wynetta Emery, RN, BSN   Supervising physician immediately available to respond to emergencies See telemetry face sheet for immediately available MD   Medication changes reported     No   Fall or balance concerns reported    No   Warm-up and Cool-down Performed as group-led instruction   Resistance Training Performed Yes   VAD Patient? No     Pain Assessment   Currently in Pain? No/denies   Pain Score 0-No pain   Multiple Pain Sites No      Capillary Blood Glucose: No results found for this or any previous visit (from the past 24 hour(s)).   Goals Met:  Independence with exercise equipment Exercise tolerated well No report of cardiac concerns or symptoms Strength training completed today  Goals Unmet:  Not Applicable  Comments: Check out 1200   Dr. Kate Sable is Medical Director for [place] and Pulmonary Rehab.

## 2016-06-10 NOTE — Progress Notes (Signed)
Cardiology Office Note  Date: 06/11/2016   ID: [name], [name] [birth year], MRN [id number]  PCP: [facility]  Evaluating Cardiologist: Nona Dell, MD   Chief Complaint  Patient presents with  . Coronary Artery Disease    History of Present Illness:  Teresa Hart is a 65 y.o. female that I am meeting for the first time in clinic today. I reviewed extensive records and updated her chart. She was admitted to the hospital in March of this year with NSTEMI (peak troponin I 9.5) and underwent cardiac catheterization with placement of DES to the RCA. She was to undergo subsequent staged intervention to the OM 2, although decision was made to treat medically at repeat angiography in April per Dr. Katrinka Blazing.  She is doing well at this time. Currently in the cardiac rehabilitation program, has also started doing water aerobics. She does not report any progressive angina, used nitroglycerin on one occasion since her last office visit.  She has a history of intolerance to Crestor, has been tried most recently on Lipitor with significant lipid abnormalities, outlined below. She does not report any obvious side effects so far. We discussed getting a follow-up lipid panel.  She follows with PCP in [facility].  Past Medical History:  Diagnosis Date  . Aortic stenosis, mild    a. per echo Jan 2013; EF is 61%  . Asthma   . CAD (coronary artery disease)    a. 11/2011 abnl MV; b. 11/2011 Cath: No obstructive disease in the large caliber vessels, disease in the small OM2 and distal LAD, not amenable to PCI or grafting; c. 01/2016 PCI: LM nl, LAD 30ost, 59m, LCX nl, OM1 small, OM2 90 diff, OM3 small, RCA  95p/m 2.75 x 24 Promus Premier DES, 60m. Severe OM2 managed medically.  . Cardiac enlargement   . Carotid stenosis, bilateral    a. per doppler Jan 2013; 60-79% RICA, 40-59% LICA  . CKD (chronic kidney disease), stage III   . GERD (gastroesophageal reflux  disease)   . Hyperlipidemia   . Hypertensive heart disease   . Hypothyroid   . Obese   . Type I diabetes mellitus (HCC)     Past Surgical History:  Procedure Laterality Date  . ABDOMINAL HYSTERECTOMY    . APPENDECTOMY    . CARDIAC CATHETERIZATION  Jan 2013   No major obstructive disease in the large caliber vessels, with disease in a small OM2 and distal LAD; not amenable to PCI or grafting.   Marland Kitchen CARDIAC CATHETERIZATION N/A 02/05/2016   Procedure: Left Heart Cath and Coronary Angiography;  Surgeon: Peter M Swaziland, MD;  Location: [facility] INVASIVE CV LAB;  Service: Cardiovascular;  Laterality: N/A;  . CARDIAC CATHETERIZATION  02/05/2016   Procedure: Coronary Stent Intervention;  Surgeon: Peter M Swaziland, MD;  Location: [facility] INVASIVE CV LAB;  Service: Cardiovascular;;  . CARDIAC CATHETERIZATION N/A 02/20/2016   Procedure: Coronary Stent Intervention;  Surgeon: Lyn Records, MD;  Location: [facility] INVASIVE CV LAB;  Service: Cardiovascular;  Laterality: N/A;  . KNEE ARTHROSCOPY  06/24/2012   Procedure: ARTHROSCOPY KNEE;  Surgeon: Nestor Lewandowsky, MD;  Location: [facility];  Service: Orthopedics;  Laterality: Right;  DEBRIDEMENT OF CHONDROMALACIA  . KNEE SURGERY    . TONSILLECTOMY      Current Outpatient Prescriptions  Medication Sig Dispense Refill  . ACCU-CHEK AVIVA PLUS test strip CHECK BLOOD SUGAR 4 TIMES DAILY. 450 each 2  . albuterol (PROVENTIL HFA;VENTOLIN  HFA) 108 (90 BASE) MCG/ACT inhaler Inhale 2 puffs into the lungs every 6 (six) hours as needed. 18 g 2  . ARIPiprazole (ABILIFY) 20 MG tablet Take 10 mg by mouth every evening.     Marland Kitchen aspirin EC 81 MG tablet Take 1 tablet (81 mg total) by mouth daily. 150 tablet 2  . atorvastatin (LIPITOR) 80 MG tablet Take 1 tablet (80 mg total) by mouth daily at 6 PM. 90 tablet 3  . carvedilol (COREG) 25 MG tablet Take 1 tablet (25 mg total) by mouth 2 (two) times daily with a meal. 180 tablet 3  . clopidogrel (PLAVIX) 75 MG tablet Take 1 tablet (75  mg total) by mouth daily. 90 tablet 3  . diazepam (VALIUM) 5 MG tablet Take 5 mg by mouth every 6 (six) hours as needed for anxiety.    . hydrALAZINE (APRESOLINE) 50 MG tablet Take 1 tablet (50 mg total) by mouth 3 (three) times daily. 270 tablet 3  . Insulin Glargine (LANTUS SOLOSTAR) 100 UNIT/ML Solostar Pen Inject 90 Units into the skin at bedtime. 15 pen 1  . insulin lispro (HUMALOG) 100 UNIT/ML KiwkPen Inject 0.18-0.24 mLs (18-24 Units total) into the skin 3 (three) times daily with meals. 15 pen 1  . Insulin Pen Needle (B-D ULTRAFINE III SHORT PEN) 31G X 8 MM MISC 1 each by Does not apply route as directed. 150 each 3  . levocetirizine (XYZAL) 5 MG tablet Take 1 tablet (5 mg total) by mouth every evening. 30 tablet 5  . Liraglutide (VICTOZA) 18 MG/3ML SOPN Inject 0.3 mLs (1.8 mg total) into the skin at bedtime. 9 pen 1  . LORazepam (ATIVAN) 1 MG tablet Take 1 mg by mouth as needed for anxiety or sleep.     . nitroGLYCERIN (NITROSTAT) 0.4 MG SL tablet Place 1 tablet (0.4 mg total) under the tongue every 5 (five) minutes as needed for chest pain. 25 tablet 3  . ranitidine (ZANTAC) 150 MG tablet Take 1 tablet (150 mg total) by mouth daily. 90 tablet 3  . Vortioxetine HBr (TRINTELLIX) 20 MG TABS Take 20 mg by mouth daily.      No current facility-administered medications for this visit.    Allergies:  Brilinta [ticagrelor]; Amoxicillin-pot clavulanate; Crestor [rosuvastatin]; Codeine; and Penicillins   Social History: The patient  reports that she has never smoked. She has never used smokeless tobacco. She reports that she does not drink alcohol or use drugs.   ROS:  Please see the history of present illness. Otherwise, complete review of systems is positive for arthritic knee pain.  All other systems are reviewed and negative.   Physical Exam: VS:  BP 124/74   Pulse 86   Ht 5\' 5"  (1.651 m)   Wt 208 lb (94.3 kg)   SpO2 96%   BMI 34.61 kg/m , BMI Body mass index is 34.61 kg/m.  Wt  Readings from Last 3 Encounters:  06/11/16 208 lb (94.3 kg)  06/05/16 211 lb (95.7 kg)  05/24/16 211 lb 13.8 oz (96.1 kg)    General: Overweight woman, appears comfortable at rest. HEENT: Conjunctiva and lids normal, oropharynx clear. Neck: Supple, no elevated JVP or carotid bruits, no thyromegaly. Lungs: Clear to auscultation, nonlabored breathing at rest. Cardiac: Regular rate and rhythm, no S3, 2/6 basal systolic murmur, no pericardial rub. Abdomen: Soft, nontender,  bowel sounds present, no guarding or rebound. Extremities: No pitting edema, distal pulses 2+. Skin: Warm and dry. Musculoskeletal: No kyphosis. Neuropsychiatric: Alert and  oriented x3, affect grossly appropriate.  ECG: I personally reviewed the tracing from 02/13/2016 which showed sinus rhythm with low voltage.  Recent Labwork: 02/02/2016: B Natriuretic Peptide 63.0; TSH 3.918 02/13/2016: Hemoglobin 9.8; Platelets 323 05/27/2016: ALT 19; AST 19; BUN 19; Creat 1.55; Potassium 4.7; Sodium 138     Component Value Date/Time   CHOL 332 (H) 02/03/2016 0317   TRIG 311 (H) 02/03/2016 0317   HDL 48 02/03/2016 0317   CHOLHDL 6.9 02/03/2016 0317   VLDL 62 (H) 02/03/2016 0317   LDLCALC 222 (H) 02/03/2016 0317    Other Studies Reviewed Today:  Cardiac catheterization 02/05/2016 (Dr. Swaziland):  Mid RCA lesion, 40% stenosed.  Ost LAD to Prox LAD lesion, 30% stenosed.  Mid LAD to Dist LAD lesion, 60% stenosed.  Ost 2nd Mrg to 2nd Mrg lesion, 90% stenosed.  Prox RCA to Mid RCA lesion, 95% stenosed. Post intervention, there is a 0% residual stenosis.   1. 2 vessel obstructive CAD 2. Elevated LV EDP 3. Successful stenting of the mid RCA with a DES.  Plan: DAPT for one year. Anticipate DC in am. RCA was the culprit lesion. There is severe disease in the second OM that was present in 2013. This was previously felt to be too small for PCI but today vessel appears to be a good size and is amenable to intervention with PCI.  Given renal dysfunction I recommend staging this procedure and having her return in 1-2 weeks for PCI of the OM if renal function is stable.   Cardiac catheterization 02/20/2016 (Dr. Katrinka Blazing): 1. Mid RCA lesion, 40% stenosed. 2. Ost LAD to Prox LAD lesion, 30% stenosed. 3. Mid LAD to Dist LAD lesion, 60% stenosed. 4. Ost 2nd Mrg to 2nd Mrg lesion, 90% stenosed. 5. 2nd Mrg lesion, 99% stenosed.    Decided against PCI because of the diffuse nature of disease and small vessel caliber noted in the obtuse marginal. My opinion is that this vessel is best treated with medical therapy.  Widely patent recently placed right coronary stent. Moderate diffuse disease beyond the stent and the right coronary. Moderate diffuse disease throughout the mid to distal LAD and severe disease in the first diagonal.  Recommendations:   Continue medical therapy including long-term dual antiplatelet therapy.  Assessment and Plan:  1. Symptomatically stable CAD status post DES to the RCA in March of this year with medically managed severe OM 2 disease as outlined. She will continue current exercise plan, no changes made to present medical regimen. Continues on DAPT.  2. Mild aortic stenosis, asymptomatic.  3. Severe hyperlipidemia, LDL 222 in March of this year. She has been tolerating high-dose Lipitor since that time, follow-up FLP.  4. CKD stage 3, creatinine 1.6.  5. Type 1 diabetes mellitus, keep follow-up with PCP. Hemoglobin A1c 7.8.  Current medicines were reviewed with the patient today.  Disposition: Follow-up with me in 6 months.  Signed, Jonelle Sidle, MD, [facility] 06/11/2016 11:01 AM    [facility] at [facility] [address], [place], [place] [address] Phone: [phone]; Fax: [phone]

## 2016-06-11 ENCOUNTER — Ambulatory Visit (INDEPENDENT_AMBULATORY_CARE_PROVIDER_SITE_OTHER): Payer: Medicare Other | Admitting: Cardiology

## 2016-06-11 ENCOUNTER — Encounter: Payer: Self-pay | Admitting: Cardiology

## 2016-06-11 VITALS — BP 124/74 | HR 86 | Ht 65.0 in | Wt 208.0 lb

## 2016-06-11 DIAGNOSIS — I350 Nonrheumatic aortic (valve) stenosis: Secondary | ICD-10-CM

## 2016-06-11 DIAGNOSIS — N183 Chronic kidney disease, stage 3 (moderate): Secondary | ICD-10-CM

## 2016-06-11 DIAGNOSIS — E785 Hyperlipidemia, unspecified: Secondary | ICD-10-CM | POA: Diagnosis not present

## 2016-06-11 DIAGNOSIS — I25119 Atherosclerotic heart disease of native coronary artery with unspecified angina pectoris: Secondary | ICD-10-CM

## 2016-06-11 NOTE — Patient Instructions (Signed)
Your physician wants you to follow-up in: 6 months You will receive a reminder letter in the mail two months in advance. If you don't receive a letter, please call our office to schedule the follow-up appointment.   Get FASTING lipid and liver blood test    Your physician recommends that you continue on your current medications as directed. Please refer to the Current Medication list given to you today.      Thank you for choosing [facility] !

## 2016-06-12 ENCOUNTER — Encounter (HOSPITAL_COMMUNITY)
Admission: RE | Admit: 2016-06-12 | Discharge: 2016-06-12 | Disposition: A | Discharge status: Home / Self Care | Payer: Medicare Other | Source: Ambulatory Visit | Attending: Cardiology | Admitting: Cardiology

## 2016-06-12 DIAGNOSIS — I214 Non-ST elevation (NSTEMI) myocardial infarction: Secondary | ICD-10-CM | POA: Diagnosis not present

## 2016-06-12 DIAGNOSIS — Z955 Presence of coronary angioplasty implant and graft: Secondary | ICD-10-CM

## 2016-06-12 LAB — HEPATIC FUNCTION PANEL
ALT: 14 U/L (ref 6–29)
AST: 14 U/L (ref 10–35)
Albumin: 4 g/dL (ref 3.6–5.1)
Alkaline Phosphatase: 101 U/L (ref 33–130)
Bilirubin, Direct: 0.1 mg/dL
Indirect Bilirubin: 0.3 mg/dL (ref 0.2–1.2)
Total Bilirubin: 0.4 mg/dL (ref 0.2–1.2)
Total Protein: 6.6 g/dL (ref 6.1–8.1)

## 2016-06-12 LAB — LIPID PANEL
CHOL/HDL RATIO: 3.3 ratio (ref <=5.0)
CHOLESTEROL: 154 mg/dL (ref 125–200)
HDL: 47 mg/dL (ref >=46)
LDL Cholesterol: 72 mg/dL (ref <130)
Triglycerides: 174 mg/dL — ABNORMAL HIGH (ref <150)
VLDL: 35 mg/dL — ABNORMAL HIGH (ref <30)

## 2016-06-12 NOTE — Progress Notes (Signed)
Daily Session Note  Patient Details  Name: Teresa Hart MRN: [id number] Date of Birth: 1951-08-31 Referring Provider:   Flowsheet Row CARDIAC REHAB PHASE II ORIENTATION from 05/23/2016 in [place]  Referring Provider  Gwenlyn Found      Encounter Date: 06/12/2016  Check In:     Session Check In - 06/12/16 1100      Check-In   Location [facility]   Staff Present Diane Angelina Pih, MS, EP, [facility], Exercise Physiologist;Debra Wynetta Emery, RN, BSN;Eugenia Eldredge, BS, EP, Exercise Physiologist   Supervising physician immediately available to respond to emergencies See telemetry face sheet for immediately available MD   Medication changes reported     No   Fall or balance concerns reported    No   Warm-up and Cool-down Performed as group-led instruction   Resistance Training Performed Yes   VAD Patient? No     Pain Assessment   Currently in Pain? No/denies   Pain Score 0-No pain   Multiple Pain Sites No      Capillary Blood Glucose: No results found for this or any previous visit (from the past 24 hour(s)).   Goals Met:  Independence with exercise equipment Exercise tolerated well No report of cardiac concerns or symptoms Strength training completed today  Goals Unmet:  Not Applicable  Comments: Check out 1200   Dr. Kate Sable is Medical Director for [place] and Pulmonary Rehab.

## 2016-06-13 NOTE — Progress Notes (Signed)
Cardiac Individual Treatment Plan  Patient Details  Name: Teresa Hart MRN: [id number] Date of Birth: March 03, 1951 Referring Provider:   Flowsheet Row CARDIAC REHAB PHASE II ORIENTATION from 05/23/2016 in [place] CARDIAC REHABILITATION  Referring Provider  Allyson Sabal      Initial Encounter Date:  Flowsheet Row CARDIAC REHAB PHASE II ORIENTATION from 05/23/2016 in [place] CARDIAC REHABILITATION  Date  05/23/16  Referring Provider  Allyson Sabal      Visit Diagnosis: NSTEMI (non-ST elevation myocardial infarction) [facility])  Stented coronary artery  Patient's Home Medications on Admission:  Current Outpatient Prescriptions:  .  ACCU-CHEK AVIVA PLUS test strip, CHECK BLOOD SUGAR 4 TIMES DAILY., Disp: 450 each, Rfl: 2 .  albuterol (PROVENTIL HFA;VENTOLIN HFA) 108 (90 BASE) MCG/ACT inhaler, Inhale 2 puffs into the lungs every 6 (six) hours as needed., Disp: 18 g, Rfl: 2 .  ARIPiprazole (ABILIFY) 20 MG tablet, Take 10 mg by mouth every evening. , Disp: , Rfl:  .  aspirin EC 81 MG tablet, Take 1 tablet (81 mg total) by mouth daily., Disp: 150 tablet, Rfl: 2 .  atorvastatin (LIPITOR) 80 MG tablet, Take 1 tablet (80 mg total) by mouth daily at 6 PM., Disp: 90 tablet, Rfl: 3 .  carvedilol (COREG) 25 MG tablet, Take 1 tablet (25 mg total) by mouth 2 (two) times daily with a meal., Disp: 180 tablet, Rfl: 3 .  clopidogrel (PLAVIX) 75 MG tablet, Take 1 tablet (75 mg total) by mouth daily., Disp: 90 tablet, Rfl: 3 .  diazepam (VALIUM) 5 MG tablet, Take 5 mg by mouth every 6 (six) hours as needed for anxiety., Disp: , Rfl:  .  hydrALAZINE (APRESOLINE) 50 MG tablet, Take 1 tablet (50 mg total) by mouth 3 (three) times daily., Disp: 270 tablet, Rfl: 3 .  Insulin Glargine (LANTUS SOLOSTAR) 100 UNIT/ML Solostar Pen, Inject 90 Units into the skin at bedtime., Disp: 15 pen, Rfl: 1 .  insulin lispro (HUMALOG) 100 UNIT/ML KiwkPen, Inject 0.18-0.24 mLs (18-24 Units total) into the skin 3 (three) times daily with meals.,  Disp: 15 pen, Rfl: 1 .  Insulin Pen Needle (B-D ULTRAFINE III SHORT PEN) 31G X 8 MM MISC, 1 each by Does not apply route as directed., Disp: 150 each, Rfl: 3 .  levocetirizine (XYZAL) 5 MG tablet, Take 1 tablet (5 mg total) by mouth every evening., Disp: 30 tablet, Rfl: 5 .  Liraglutide (VICTOZA) 18 MG/3ML SOPN, Inject 0.3 mLs (1.8 mg total) into the skin at bedtime., Disp: 9 pen, Rfl: 1 .  LORazepam (ATIVAN) 1 MG tablet, Take 1 mg by mouth as needed for anxiety or sleep. , Disp: , Rfl:  .  nitroGLYCERIN (NITROSTAT) 0.4 MG SL tablet, Place 1 tablet (0.4 mg total) under the tongue every 5 (five) minutes as needed for chest pain., Disp: 25 tablet, Rfl: 3 .  ranitidine (ZANTAC) 150 MG tablet, Take 1 tablet (150 mg total) by mouth daily., Disp: 90 tablet, Rfl: 3 .  Vortioxetine HBr (TRINTELLIX) 20 MG TABS, Take 20 mg by mouth daily. , Disp: , Rfl:   Past Medical History: Past Medical History:  Diagnosis Date  . Aortic stenosis, mild    a. per echo Jan 2013; EF is 61%  . Asthma   . CAD (coronary artery disease)    a. 11/2011 abnl MV; b. 11/2011 Cath: No obstructive disease in the large caliber vessels, disease in the small OM2 and distal LAD, not amenable to PCI or grafting; c. 01/2016 PCI: LM nl,  LAD 30ost, 88m, LCX nl, OM1 small, OM2 90 diff, OM3 small, RCA  95p/m 2.75 x 24 Promus Premier DES, 79m. Severe OM2 managed medically.  . Cardiac enlargement   . Carotid stenosis, bilateral    a. per doppler Jan 2013; 60-79% RICA, 40-59% LICA  . CKD (chronic kidney disease), stage III   . GERD (gastroesophageal reflux disease)   . Hyperlipidemia   . Hypertensive heart disease   . Hypothyroid   . Obese   . Type I diabetes mellitus (HCC)     Tobacco Use: History  Smoking Status  . Never Smoker  Smokeless Tobacco  . Never Used    Labs: Recent Review Flowsheet Data    Labs for ITP Cardiac and Pulmonary Rehab Latest Ref Rng & Units 03/15/2013 02/02/2016 02/03/2016 05/27/2016 06/11/2016   Cholestrol  125 - 200 mg/dL - - 161(W) - 960   LDLCALC <130 mg/dL - - 454(U) - 72   HDL >=98 mg/dL - - 48 - 47   Trlycerides <150 mg/dL - - 119(J) - 478(G)   Hemoglobin A1c <5.7 % 8.5 7.7(H) - 7.8(H) -   TCO2 0 - 100 mmol/L - - - - -      Capillary Blood Glucose: Lab Results  Component Value Date   GLUCAP 161 (H) 02/20/2016   GLUCAP 152 (H) 02/06/2016   GLUCAP 87 02/05/2016   GLUCAP 256 (H) 02/05/2016   GLUCAP 178 (H) 02/05/2016     Exercise Target Goals:    Exercise Program Goal: Individual exercise prescription set with THRR, safety & activity barriers. Participant demonstrates ability to understand and report RPE using BORG scale, to self-measure pulse accurately, and to acknowledge the importance of the exercise prescription.  Exercise Prescription Goal: Starting with aerobic activity 30 plus minutes a day, 3 days per week for initial exercise prescription. Provide home exercise prescription and guidelines that participant acknowledges understanding prior to discharge.  Activity Barriers & Risk Stratification:     Activity Barriers & Cardiac Risk Stratification - 05/23/16 1639      Activity Barriers & Cardiac Risk Stratification   Activity Barriers Other (comment)  Knee pain   Cardiac Risk Stratification High      6 Minute Walk:     6 Minute Walk    Row Name 05/24/16 1253         6 Minute Walk   Phase Initial     Distance 1100 feet     Walk Time 6 minutes     # of Rest Breaks 0     MPH 2.08     METS 2.59     RPE 11     Perceived Dyspnea  11     VO2 Peak 8.56     Symptoms No     Resting HR 74 bpm     Resting BP 130/70     Max Ex. HR 86 bpm     Max Ex. BP 148/72     2 Minute Post BP 130/66        Initial Exercise Prescription:     Initial Exercise Prescription - 05/24/16 1200      Date of Initial Exercise RX and Referring Provider   Date 05/23/16   Referring Provider Allyson Sabal     Treadmill   MPH 1.3   Grade 0   Minutes 15   METs 1.99      Elliptical   Level 1   Speed 38   Minutes 20   METs 2.1  Prescription Details   Frequency (times per week) 3   Duration Progress to 30 minutes of continuous aerobic without signs/symptoms of physical distress     Intensity   THRR REST +  30   THRR 40-80% of Max Heartrate 107-124-140   Ratings of Perceived Exertion 11-13   Perceived Dyspnea 0-4     Progression   Progression Continue to progress workloads to maintain intensity without signs/symptoms of physical distress.     Resistance Training   Training Prescription Yes   Weight 1   Reps 10-12      Perform Capillary Blood Glucose checks as needed.  Exercise Prescription Changes:      Exercise Prescription Changes    Row Name 06/04/16 1200             Exercise Review   Progression Yes         Response to Exercise   Blood Pressure (Admit) 126/60       Blood Pressure (Exercise) 164/52       Blood Pressure (Exit) 126/66       Heart Rate (Admit) 92 bpm       Heart Rate (Exercise) 96 bpm       Heart Rate (Exit) 87 bpm       Rating of Perceived Exertion (Exercise) 10       Symptoms None       Duration Progress to 30 minutes of continuous aerobic without signs/symptoms of physical distress       Intensity Rest + 30         Progression   Progression Continue to progress workloads to maintain intensity without signs/symptoms of physical distress.         Resistance Training   Training Prescription Yes       Weight 1       Reps 10-12         Treadmill   MPH 1.5       Grade 0       Minutes 15       METs 2.1         Elliptical   Level 2       Speed 43       Minutes 20       METs 3.64         Home Exercise Plan   Plans to continue exercise at Home       Frequency Add 2 additional days to program exercise sessions.          Exercise Comments:      Exercise Comments    Row Name 06/12/16 0857           Exercise Comments [place] is proggressing appropriately            Discharge Exercise  Prescription (Final Exercise Prescription Changes):     Exercise Prescription Changes - 06/04/16 1200      Exercise Review   Progression Yes     Response to Exercise   Blood Pressure (Admit) 126/60   Blood Pressure (Exercise) 164/52   Blood Pressure (Exit) 126/66   Heart Rate (Admit) 92 bpm   Heart Rate (Exercise) 96 bpm   Heart Rate (Exit) 87 bpm   Rating of Perceived Exertion (Exercise) 10   Symptoms None   Duration Progress to 30 minutes of continuous aerobic without signs/symptoms of physical distress   Intensity Rest + 30     Progression   Progression Continue to progress workloads to maintain  intensity without signs/symptoms of physical distress.     Resistance Training   Training Prescription Yes   Weight 1   Reps 10-12     Treadmill   MPH 1.5   Grade 0   Minutes 15   METs 2.1     Elliptical   Level 2   Speed 43   Minutes 20   METs 3.64     Home Exercise Plan   Plans to continue exercise at Home   Frequency Add 2 additional days to program exercise sessions.      Nutrition:  Target Goals: Understanding of nutrition guidelines, daily intake of sodium 1500mg , cholesterol 200mg , calories 30% from fat and 7% or less from saturated fats, daily to have 5 or more servings of fruits and vegetables.  Biometrics:     Pre Biometrics - 05/24/16 1257      Pre Biometrics   Height 5\' 5"  (1.651 m)   Weight 211 lb 13.8 oz (96.1 kg)   Waist Circumference 49.5 inches   Hip Circumference 44 inches   Waist to Hip Ratio 1.12 %   BMI (Calculated) 35.3   Triceps Skinfold 15 mm   % Body Fat 39.8 %   Grip Strength 62.7 kg   Flexibility 11.7 in   Single Leg Stand 4 seconds       Nutrition Therapy Plan and Nutrition Goals:   Nutrition Discharge: Rate Your Plate Scores:     Nutrition Assessments - 05/23/16 1645      MEDFICTS Scores   Pre Score 35      Nutrition Goals Re-Evaluation:   Psychosocial: Target Goals: Acknowledge presence or absence of  depression, maximize coping skills, provide positive support system. Participant is able to verbalize types and ability to use techniques and skills needed for reducing stress and depression.  Initial Review & Psychosocial Screening:     Initial Psych Review & Screening - 05/23/16 1652      Initial Review   Current issues with --  She has no current issues.     Family Dynamics   Good Support System? Yes     Barriers   Psychosocial barriers to participate in program There are no identifiable barriers or psychosocial needs.     Screening Interventions   Interventions Encouraged to exercise      Quality of Life Scores:     Quality of Life - 05/24/16 1258      Quality of Life Scores   Health/Function Pre 15.27 %   Socioeconomic Pre 18.58 %   Psych/Spiritual Pre 13.71 %   Family Pre 18 %   GLOBAL Pre 15.82 %      PHQ-9: Recent Review Flowsheet Data    Depression screen [facility] 2/9 05/23/2016 03/04/2016 11/30/2015 10/12/2014   Decreased Interest 0 0 0 1   Down, Depressed, Hopeless 0 0 0 1   PHQ - 2 Score 0 0 0 2   Altered sleeping 1 - - -   Tired, decreased energy 1 - - -   Change in appetite 1 - - -   Feeling bad or failure about yourself  0 - - -   Trouble concentrating 1 - - -   Moving slowly or fidgety/restless 0 - - -   Suicidal thoughts 0 - - -   PHQ-9 Score 4 - - -   Difficult doing work/chores Not difficult at all - - -      Psychosocial Evaluation and Intervention:   Psychosocial Re-Evaluation:  Psychosocial Re-Evaluation    Row Name 06/13/16 1325             Psychosocial Re-Evaluation   Comments Patient's initial QOL score was 15.82 and PHQ-9 score 4. She stated at her initial visit that she had been feeling down d/t her slow recovery from her MI. She states her mood has improved since starting the program and continues to improve after 9 sessions. She says she does not need counseling.        Continued Psychosocial Services Needed No           Vocational Rehabilitation: Provide vocational rehab assistance to qualifying candidates.   Vocational Rehab Evaluation & Intervention:     Vocational Rehab - 05/23/16 1642      Initial Vocational Rehab Evaluation & Intervention   Assessment shows need for Vocational Rehabilitation No      Education: Education Goals: Education classes will be provided on a weekly basis, covering required topics. Participant will state understanding/return demonstration of topics presented.  Learning Barriers/Preferences:     Learning Barriers/Preferences - 05/23/16 1641      Learning Barriers/Preferences   Learning Barriers None   Learning Preferences Skilled Demonstration      Education Topics: Hypertension, Hypertension Reduction -Define heart disease and high blood pressure. Discus how high blood pressure affects the body and ways to reduce high blood pressure.   Exercise and Your Heart -Discuss why it is important to exercise, the FITT principles of exercise, normal and abnormal responses to exercise, and how to exercise safely.   Angina -Discuss definition of angina, causes of angina, treatment of angina, and how to decrease risk of having angina.   Cardiac Medications -Review what the following cardiac medications are used for, how they affect the body, and side effects that may occur when taking the medications.  Medications include Aspirin, Beta blockers, calcium channel blockers, ACE Inhibitors, angiotensin receptor blockers, diuretics, digoxin, and antihyperlipidemics.   Congestive Heart Failure -Discuss the definition of CHF, how to live with CHF, the signs and symptoms of CHF, and how keep track of weight and sodium intake.   Heart Disease and Intimacy -Discus the effect sexual activity has on the heart, how changes occur during intimacy as we age, and safety during sexual activity.   Smoking Cessation / COPD -Discuss different methods to quit smoking, the health  benefits of quitting smoking, and the definition of COPD.   Nutrition I: Fats -Discuss the types of cholesterol, what cholesterol does to the heart, and how cholesterol levels can be controlled.   Nutrition II: Labels -Discuss the different components of food labels and how to read food label   Heart Parts and Heart Disease -Discuss the anatomy of the heart, the pathway of blood circulation through the heart, and these are affected by heart disease.   Stress I: Signs and Symptoms -Discuss the causes of stress, how stress may lead to anxiety and depression, and ways to limit stress. Flowsheet Row CARDIAC REHAB PHASE II EXERCISE from 06/05/2016 in [place] CARDIAC REHABILITATION  Date  05/29/16  Educator  DJ  Instruction Review Code  2- meets goals/outcomes      Stress II: Relaxation -Discuss different types of relaxation techniques to limit stress. Flowsheet Row CARDIAC REHAB PHASE II EXERCISE from 06/05/2016 in [place] CARDIAC REHABILITATION  Date  06/05/16  Educator  DJ  Instruction Review Code  2- meets goals/outcomes      Warning Signs of Stroke / TIA -Discuss definition of a  stroke, what the signs and symptoms are of a stroke, and how to identify when someone is having stroke.   Knowledge Questionnaire Score:     Knowledge Questionnaire Score - 05/23/16 1642      Knowledge Questionnaire Score   Pre Score 23/24      Core Components/Risk Factors/Patient Goals at Admission:     Personal Goals and Risk Factors at Admission - 05/23/16 1646      Core Components/Risk Factors/Patient Goals on Admission    Weight Management Weight Loss;Yes   Admit Weight 211 lb 1.6 oz (95.8 kg)   Goal Weight: Short Term 206 lb 1.6 oz (93.5 kg)   Goal Weight: Long Term 201 lb 1.6 oz (91.2 kg)   Expected Outcomes Short Term: Continue to assess and modify interventions until short term weight is achieved;Long Term: Adherence to nutrition and physical activity/exercise program aimed  toward attainment of established weight goal   Increase Strength and Stamina Yes   Intervention Provide advice, education, support and counseling about physical activity/exercise needs.;Develop an individualized exercise prescription for aerobic and resistive training based on initial evaluation findings, risk stratification, comorbidities and participant's personal goals.   Expected Outcomes Achievement of increased cardiorespiratory fitness and enhanced flexibility, muscular endurance and strength shown through measurements of functional capacity and personal statement of participant.   Diabetes Yes   Intervention Provide education about signs/symptoms and action to take for hypo/hyperglycemia.   Expected Outcomes Long Term: Attainment of HbA1C < 7%.   Lipids Yes   Intervention Provide education and support for participant on nutrition & aerobic/resistive exercise along with prescribed medications to achieve LDL 70mg , HDL >40mg .   Expected Outcomes Short Term: Participant states understanding of desired cholesterol values and is compliant with medications prescribed. Participant is following exercise prescription and nutrition guidelines.;Long Term: Cholesterol controlled with medications as prescribed, with individualized exercise RX and with personalized nutrition plan. Value goals: LDL < 70mg , HDL > 40 mg.   Personal Goal Other Yes   Personal Goal Build stamina, lose weight, Get back to normal ADL's and continue to lose weight   Intervention Attend CR 3 x week and to supplement with 2 x week at home or local gym.    Expected Outcomes To reach personal goals.      Core Components/Risk Factors/Patient Goals Review:      Goals and Risk Factor Review    Row Name 06/13/16 1314             Core Components/Risk Factors/Patient Goals Review   Personal Goals Review Weight Management/Obesity;Increase Strength and Stamina;Other  Get back to normal ADL's.        Review Patient has had 9  sessions losing 2 lbs. Her strength and stamina have improved.        Expected Outcomes Patient will continue to work toward her weight loss goal of 5-10 lbs and continue to increase her strength and stamina.           Core Components/Risk Factors/Patient Goals at Discharge (Final Review):      Goals and Risk Factor Review - 06/13/16 1314      Core Components/Risk Factors/Patient Goals Review   Personal Goals Review Weight Management/Obesity;Increase Strength and Stamina;Other  Get back to normal ADL's.    Review Patient has had 9 sessions losing 2 lbs. Her strength and stamina have improved.    Expected Outcomes Patient will continue to work toward her weight loss goal of 5-10 lbs and continue to increase her strength and  stamina.       ITP Comments:   Comments: Patient doing well with program. Will continue to monitor for progress.

## 2016-06-14 ENCOUNTER — Encounter (HOSPITAL_COMMUNITY): Payer: Medicare Other

## 2016-06-17 ENCOUNTER — Encounter (HOSPITAL_COMMUNITY): Payer: Medicare Other

## 2016-06-19 ENCOUNTER — Encounter (HOSPITAL_COMMUNITY): Payer: Medicare Other

## 2016-06-21 ENCOUNTER — Encounter (HOSPITAL_COMMUNITY): Payer: Medicare Other

## 2016-06-24 ENCOUNTER — Encounter (HOSPITAL_COMMUNITY): Admission: RE | Admit: 2016-06-24 | Payer: Medicare Other | Source: Ambulatory Visit

## 2016-06-26 ENCOUNTER — Encounter (HOSPITAL_COMMUNITY): Payer: Medicare Other

## 2016-06-27 NOTE — Progress Notes (Signed)
Discharge Summary  Patient Details  Name: Teresa Hart MRN: [id number] Date of Birth: 09/30/1951 Referring Provider:   Flowsheet Row CARDIAC REHAB PHASE II ORIENTATION from 05/23/2016 in [facility]  Referring Provider  Allyson SabalBerry       Number of Visits: 9  Reason for Discharge:  Early Exit:  Patient dropped out of program after 9 sessions stating she felt stronger now and felt like it would be more beneficial for her [facility].   Smoking History:  History  Smoking Status  . Never Smoker  Smokeless Tobacco  . Never Used    Diagnosis:  NSTEMI (non-ST elevation myocardial infarction) (HCC)  Stented coronary artery  ADL UCSD:   Initial Exercise Prescription:     Initial Exercise Prescription - 05/24/16 1200      Date of Initial Exercise RX and Referring Provider   Date 05/23/16   Referring Provider Allyson SabalBerry     Treadmill   MPH 1.3   Grade 0   Minutes 15   METs 1.99     Elliptical   Level 1   Speed 38   Minutes 20   METs 2.1     Prescription Details   Frequency (times per week) 3   Duration Progress to 30 minutes of continuous aerobic without signs/symptoms of physical distress     Intensity   THRR REST +  30   THRR 40-80% of Max Heartrate 107-124-140   Ratings of Perceived Exertion 11-13   Perceived Dyspnea 0-4     Progression   Progression Continue to progress workloads to maintain intensity without signs/symptoms of physical distress.     Resistance Training   Training Prescription Yes   Weight 1   Reps 10-12      Discharge Exercise Prescription (Final Exercise Prescription Changes):     Exercise Prescription Changes - 06/25/16 1300      Exercise Review   Progression Yes     Response to Exercise   Blood Pressure (Admit) 132/70   Blood Pressure (Exercise) 154/72   Blood Pressure (Exit) 132/70   Heart Rate (Admit) 72 bpm   Heart Rate (Exercise) 92 bpm   Heart Rate (Exit) 80 bpm   Rating of Perceived  Exertion (Exercise) 10   Symptoms None   Duration Progress to 30 minutes of continuous aerobic without signs/symptoms of physical distress   Intensity Rest + 30     Progression   Progression Continue to progress workloads to maintain intensity without signs/symptoms of physical distress.     Resistance Training   Training Prescription Yes   Weight 2   Reps 10-12     Treadmill   MPH 1.5   Grade 0   Minutes 15   METs 2.1     Elliptical   Level 2   Speed 64   Minutes 20   METs 3.5     Home Exercise Plan   Plans to continue exercise at Home   Frequency Add 2 additional days to program exercise sessions.      Functional Capacity:     6 Minute Walk    Row Name 05/24/16 1253         6 Minute Walk   Phase Initial     Distance 1100 feet     Walk Time 6 minutes     # of Rest Breaks 0     MPH 2.08     METS 2.59     RPE 11  Perceived Dyspnea  11     VO2 Peak 8.56     Symptoms No     Resting HR 74 bpm     Resting BP 130/70     Max Ex. HR 86 bpm     Max Ex. BP 148/72     2 Minute Post BP 130/66        Psychological, QOL, Others - Outcomes: PHQ 2/9: Depression screen [facility] 2/9 05/23/2016 03/04/2016 11/30/2015 10/12/2014  Decreased Interest 0 0 0 1  Down, Depressed, Hopeless 0 0 0 1  PHQ - 2 Score 0 0 0 2  Altered sleeping 1 - - -  Tired, decreased energy 1 - - -  Change in appetite 1 - - -  Feeling bad or failure about yourself  0 - - -  Trouble concentrating 1 - - -  Moving slowly or fidgety/restless 0 - - -  Suicidal thoughts 0 - - -  PHQ-9 Score 4 - - -  Difficult doing work/chores Not difficult at all - - -    Quality of Life:     Quality of Life - 05/24/16 1258      Quality of Life Scores   Health/Function Pre 15.27 %   Socioeconomic Pre 18.58 %   Psych/Spiritual Pre 13.71 %   Family Pre 18 %   GLOBAL Pre 15.82 %      Personal Goals: Goals established at orientation with interventions provided to work toward goal.     Personal Goals and  Risk Factors at Admission - 05/23/16 1646      Core Components/Risk Factors/Patient Goals on Admission    Weight Management Weight Loss;Yes   Admit Weight 211 lb 1.6 oz (95.8 kg)   Goal Weight: Short Term 206 lb 1.6 oz (93.5 kg)   Goal Weight: Long Term 201 lb 1.6 oz (91.2 kg)   Expected Outcomes Short Term: Continue to assess and modify interventions until short term weight is achieved;Long Term: Adherence to nutrition and physical activity/exercise program aimed toward attainment of established weight goal   Increase Strength and Stamina Yes   Intervention Provide advice, education, support and counseling about physical activity/exercise needs.;Develop an individualized exercise prescription for aerobic and resistive training based on initial evaluation findings, risk stratification, comorbidities and participant's personal goals.   Expected Outcomes Achievement of increased cardiorespiratory fitness and enhanced flexibility, muscular endurance and strength shown through measurements of functional capacity and personal statement of participant.   Diabetes Yes   Intervention Provide education about signs/symptoms and action to take for hypo/hyperglycemia.   Expected Outcomes Long Term: Attainment of HbA1C < 7%.   Lipids Yes   Intervention Provide education and support for participant on nutrition & aerobic/resistive exercise along with prescribed medications to achieve LDL 70mg , HDL >40mg .   Expected Outcomes Short Term: Participant states understanding of desired cholesterol values and is compliant with medications prescribed. Participant is following exercise prescription and nutrition guidelines.;Long Term: Cholesterol controlled with medications as prescribed, with individualized exercise RX and with personalized nutrition plan. Value goals: LDL < 70mg , HDL > 40 mg.   Personal Goal Other Yes   Personal Goal Build stamina, lose weight, Get back to normal ADL's and continue to lose weight    Intervention Attend CR 3 x week and to supplement with 2 x week at home or local gym.    Expected Outcomes To reach personal goals.       Personal Goals Discharge:     Goals and Risk Factor  Review    Row Name 06/13/16 1314             Core Components/Risk Factors/Patient Goals Review   Personal Goals Review Weight Management/Obesity;Increase Strength and Stamina;Other  Get back to normal ADL's.        Review Patient has had 9 sessions losing 2 lbs. Her strength and stamina have improved.        Expected Outcomes Patient will continue to work toward her weight loss goal of 5-10 lbs and continue to increase her strength and stamina.           Nutrition & Weight - Outcomes:     Pre Biometrics - 05/24/16 1257      Pre Biometrics   Height  (1.651 m)   Weight 211 lb 13.8 oz (96.1 kg)   Waist Circumference 49.5 inches   Hip Circumference 44 inches   Waist to Hip Ratio 1.12 %   BMI (Calculated) 35.3   Triceps Skinfold 15 mm   % Body Fat 39.8 %   Grip Strength 62.7 kg   Flexibility 11.7 in   Single Leg Stand 4 seconds       Nutrition:   Nutrition Discharge:     Nutrition Assessments - 05/23/16 1645      MEDFICTS Scores   Pre Score 35      Education Questionnaire Score:     Knowledge Questionnaire Score - 05/23/16 1642      Knowledge Questionnaire Score   Pre Score 23/24

## 2016-06-28 ENCOUNTER — Encounter (HOSPITAL_COMMUNITY): Payer: Medicare Other

## 2016-06-28 ENCOUNTER — Other Ambulatory Visit: Payer: Self-pay

## 2016-06-28 MED ORDER — LIRAGLUTIDE 18 MG/3ML ~~LOC~~ SOPN: 1.8000 mg | PEN_INJECTOR | Freq: Every day | SUBCUTANEOUS | 0 refills | Status: AC

## 2016-06-28 MED ORDER — INSULIN GLARGINE 100 UNIT/ML SOLOSTAR PEN: 90.0000 [IU] | PEN_INJECTOR | Freq: Every day | SUBCUTANEOUS | 0 refills | Status: DC

## 2016-07-01 ENCOUNTER — Ambulatory Visit (HOSPITAL_COMMUNITY): Payer: Medicare Other | Admitting: Psychiatry

## 2016-07-01 ENCOUNTER — Encounter (HOSPITAL_COMMUNITY): Payer: Medicare Other

## 2016-07-03 ENCOUNTER — Encounter (HOSPITAL_COMMUNITY): Payer: Medicare Other

## 2016-07-05 ENCOUNTER — Encounter (HOSPITAL_COMMUNITY): Payer: Medicare Other

## 2016-07-08 ENCOUNTER — Encounter (HOSPITAL_COMMUNITY): Payer: Medicare Other

## 2016-07-08 ENCOUNTER — Ambulatory Visit: Payer: Medicare Other

## 2016-07-10 ENCOUNTER — Encounter (HOSPITAL_COMMUNITY): Payer: Medicare Other

## 2016-07-11 ENCOUNTER — Ambulatory Visit
Admission: RE | Admit: 2016-07-11 | Discharge: 2016-07-11 | Disposition: A | Discharge status: Home / Self Care | Payer: Medicare Other | Source: Ambulatory Visit | Attending: Physician Assistant | Admitting: Physician Assistant

## 2016-07-11 DIAGNOSIS — Z1231 Encounter for screening mammogram for malignant neoplasm of breast: Secondary | ICD-10-CM

## 2016-07-12 ENCOUNTER — Encounter: Payer: Self-pay | Admitting: "Endocrinology

## 2016-07-12 ENCOUNTER — Encounter (HOSPITAL_COMMUNITY): Payer: Medicare Other

## 2016-07-15 ENCOUNTER — Encounter (HOSPITAL_COMMUNITY): Payer: Medicare Other

## 2016-07-17 ENCOUNTER — Encounter (HOSPITAL_COMMUNITY): Payer: Medicare Other

## 2016-07-19 ENCOUNTER — Encounter (HOSPITAL_COMMUNITY): Payer: Medicare Other

## 2016-07-22 ENCOUNTER — Encounter (HOSPITAL_COMMUNITY): Payer: Medicare Other

## 2016-07-24 ENCOUNTER — Encounter (HOSPITAL_COMMUNITY): Payer: Medicare Other

## 2016-07-26 ENCOUNTER — Encounter (HOSPITAL_COMMUNITY): Payer: Medicare Other

## 2016-07-29 ENCOUNTER — Encounter (HOSPITAL_COMMUNITY): Payer: Medicare Other

## 2016-07-30 ENCOUNTER — Ambulatory Visit (HOSPITAL_COMMUNITY): Payer: Medicare Other | Admitting: Psychiatry

## 2016-07-31 ENCOUNTER — Encounter (HOSPITAL_COMMUNITY): Payer: Medicare Other

## 2016-08-02 ENCOUNTER — Encounter (HOSPITAL_COMMUNITY): Payer: Medicare Other

## 2016-08-05 ENCOUNTER — Encounter (HOSPITAL_COMMUNITY): Payer: Medicare Other

## 2016-08-07 ENCOUNTER — Encounter (HOSPITAL_COMMUNITY): Payer: Medicare Other

## 2016-08-09 ENCOUNTER — Encounter (HOSPITAL_COMMUNITY): Payer: Medicare Other

## 2016-08-12 ENCOUNTER — Encounter (HOSPITAL_COMMUNITY): Payer: Medicare Other

## 2016-08-14 ENCOUNTER — Encounter (HOSPITAL_COMMUNITY): Payer: Medicare Other

## 2016-08-16 ENCOUNTER — Encounter (HOSPITAL_COMMUNITY): Payer: Medicare Other

## 2016-08-18 HISTORY — PX: CATARACT EXTRACTION: SUR2

## 2016-09-06 ENCOUNTER — Ambulatory Visit: Payer: Medicare Other | Admitting: "Endocrinology

## 2016-11-06 ENCOUNTER — Ambulatory Visit: Payer: Medicare Other | Admitting: Audiology

## 2016-11-25 ENCOUNTER — Ambulatory Visit: Payer: Medicare Other | Admitting: Audiology

## 2016-12-16 ENCOUNTER — Ambulatory Visit (INDEPENDENT_AMBULATORY_CARE_PROVIDER_SITE_OTHER): Payer: Medicare Other | Admitting: Cardiology

## 2016-12-16 ENCOUNTER — Encounter: Payer: Self-pay | Admitting: Cardiology

## 2016-12-16 VITALS — BP 120/62 | HR 73 | Ht 65.0 in | Wt 191.0 lb

## 2016-12-16 DIAGNOSIS — I350 Nonrheumatic aortic (valve) stenosis: Secondary | ICD-10-CM

## 2016-12-16 DIAGNOSIS — I5032 Chronic diastolic (congestive) heart failure: Secondary | ICD-10-CM | POA: Diagnosis not present

## 2016-12-16 DIAGNOSIS — I2510 Atherosclerotic heart disease of native coronary artery without angina pectoris: Secondary | ICD-10-CM

## 2016-12-16 DIAGNOSIS — E782 Mixed hyperlipidemia: Secondary | ICD-10-CM

## 2016-12-16 MED ORDER — FUROSEMIDE 20 MG PO TABS: 20.0000 mg | ORAL_TABLET | Freq: Every day | ORAL | 3 refills | Status: DC

## 2016-12-16 NOTE — Progress Notes (Signed)
  Cardiology Office Note  Date: 12/16/2016   ID: Teresa Hart, DOB 04/25/1951, MRN [id number]  PCP: [facility]  Primary Cardiologist:  , MD   Chief Complaint  Patient presents with  . Coronary Artery Disease    History of Present Illness: Teresa Hart is a 65 y.o. female that I met back in July 2017. She presents for a routine follow-up visit. Reports no major change in cardiac status. She exercises [facility] days a week [facility] the [employer], does water aerobics, walks on the treadmill or uses the elliptical machine. She does not report regular angina symptoms with exercise. Feels left arm pain sometimes at night when she is watching TV, sounds potentially musculoskeletal in description. Also has had some shortness of breath although was placed on low-dose Lasix recently and feels better. Echocardiogram from last year showed vigorous LVEF with grade 1 diastolic dysfunction.  Current cardiac regimen includes aspirin, Plavix, Lipitor, Coreg, Lasix, hydralazine, and as needed nitroglycerin.  She continues to follow with endocrinology, has had adjustments in her diabetes regimen.  Past Medical History:  Diagnosis Date  . Aortic stenosis, mild    a. per echo Jan 2013; EF is 61%  . Asthma   . CAD (coronary artery disease)    a. 11/2011 abnl MV; b. 11/2011 Cath: No obstructive disease in the large caliber vessels, disease in the small OM2 and distal LAD, not amenable to PCI or grafting; c. 01/2016 PCI: LM nl, LAD 30ost, 60m, LCX nl, OM1 small, OM2 90 diff, OM3 small, RCA  95p/m 2.75 x 24 Promus Premier DES, 40m. Severe OM2 managed medically.  . Cardiac enlargement   . Carotid stenosis, bilateral    a. per doppler Jan 2013; 60-79% RICA, 40-59% LICA  . CKD (chronic kidney disease), stage III   . GERD (gastroesophageal reflux disease)   . Hyperlipidemia   . Hypertensive heart disease   . Hypothyroid   . Obese   . Type I diabetes mellitus (HCC)     Past  Surgical History:  Procedure Laterality Date  . ABDOMINAL HYSTERECTOMY    . APPENDECTOMY    . CARDIAC CATHETERIZATION  Jan 2013   No major obstructive disease in the large caliber vessels, with disease in a small OM2 and distal LAD; not amenable to PCI or grafting.   . CARDIAC CATHETERIZATION N/A 02/05/2016   Procedure: Left Heart Cath and Coronary Angiography;  Surgeon: Peter M Jordan, MD;  Location: [facility];  Service: Cardiovascular;  Laterality: N/A;  . CARDIAC CATHETERIZATION  02/05/2016   Procedure: Coronary Stent Intervention;  Surgeon: Peter M Jordan, MD;  Location: [facility];  Service: Cardiovascular;;  . CARDIAC CATHETERIZATION N/A 02/20/2016   Procedure: Coronary Stent Intervention;  Surgeon: Henry W Smith, MD;  Location: [facility];  Service: Cardiovascular;  Laterality: N/A;  . CATARACT EXTRACTION  08/2016  . KNEE ARTHROSCOPY  06/24/2012   Procedure: ARTHROSCOPY KNEE;  Surgeon: Frank J Rowan, MD;  Location: [facility];  Service: Orthopedics;  Laterality: Right;  DEBRIDEMENT OF CHONDROMALACIA  . KNEE SURGERY    . TONSILLECTOMY      Current Outpatient Prescriptions  Medication Sig Dispense Refill  . ACCU-CHEK AVIVA PLUS test strip CHECK BLOOD SUGAR 4 TIMES DAILY. 450 each 2  . albuterol (PROVENTIL HFA;VENTOLIN HFA) 108 (90 BASE) MCG/ACT inhaler Inhale 2 puffs into the lungs every 6 (six) hours as needed. 18 g 2  . ARIPiprazole (ABILIFY) 20   MG tablet Take 10 mg by mouth every evening.     Marland Kitchen aspirin EC 81 MG tablet Take 1 tablet (81 mg total) by mouth daily. 150 tablet 2  . atorvastatin (LIPITOR) 80 MG tablet Take 1 tablet (80 mg total) by mouth daily at 6 PM. 90 tablet 3  . carvedilol (COREG) 25 MG tablet Take 1 tablet (25 mg total) by mouth 2 (two) times daily with a meal. 180 tablet 3  . clopidogrel (PLAVIX) 75 MG tablet Take 1 tablet (75 mg total) by mouth daily. 90 tablet 3  . diazepam (VALIUM) 5 MG tablet Take 5 mg by mouth every 6 (six)  hours as needed for anxiety.    . furosemide (LASIX) 20 MG tablet Take 1 tablet (20 mg total) by mouth daily. 90 tablet 3  . HUMULIN R U-500 KWIKPEN 500 UNIT/ML kwikpen Inject 60 Units into the skin 3 (three) times daily with meals.     . hydrALAZINE (APRESOLINE) 50 MG tablet Take 1 tablet (50 mg total) by mouth 3 (three) times daily. 270 tablet 3  . Insulin Pen Needle (B-D ULTRAFINE III SHORT PEN) 31G X 8 MM MISC 1 each by Does not apply route as directed. 150 each 3  . levocetirizine (XYZAL) 5 MG tablet Take 1 tablet (5 mg total) by mouth every evening. 30 tablet 5  . Liraglutide (VICTOZA) 18 MG/3ML SOPN Inject 0.3 mLs (1.8 mg total) into the skin at bedtime. 27 pen 0  . LORazepam (ATIVAN) 1 MG tablet Take 1 mg by mouth as needed for anxiety or sleep.     . nitroGLYCERIN (NITROSTAT) 0.4 MG SL tablet Place 1 tablet (0.4 mg total) under the tongue every 5 (five) minutes as needed for chest pain. 25 tablet 3  . omeprazole (PRILOSEC) 40 MG capsule     . ranitidine (ZANTAC) 150 MG tablet Take 1 tablet (150 mg total) by mouth daily. 90 tablet 3  . Vitamin D, Ergocalciferol, (DRISDOL) 50000 units CAPS capsule Take 50,000 Units by mouth every 7 (seven) days.    . Vortioxetine HBr (TRINTELLIX) 20 MG TABS Take 20 mg by mouth daily.      No current facility-administered medications for this visit.    Allergies:  Brilinta [ticagrelor]; Amoxicillin-pot clavulanate; Crestor [rosuvastatin]; Codeine; and Penicillins   Social History: The patient  reports that she has never smoked. She has never used smokeless tobacco. She reports that she does not drink alcohol or use drugs.   ROS:  Please see the history of present illness. Otherwise, complete review of systems is positive for arthritic pains.  All other systems are reviewed and negative.   Physical Exam: VS:  BP 120/62   Pulse 73   Ht 5' 5" (1.651 m)   Wt 191 lb (86.6 kg)   SpO2 97%   BMI 31.78 kg/m , BMI Body mass index is 31.78 kg/m.  Wt  Readings from Last 3 Encounters:  12/16/16 191 lb (86.6 kg)  06/11/16 208 lb (94.3 kg)  06/05/16 211 lb (95.7 kg)    General: Overweight woman, appears comfortable at rest. HEENT: Conjunctiva and lids normal, oropharynx clear. Neck: Supple, no elevated JVP or carotid bruits, no thyromegaly. Lungs: Clear to auscultation, nonlabored breathing at rest. Cardiac: Regular rate and rhythm, no S3, 2/6 basal systolic murmur, no pericardial rub. Abdomen: Soft, nontender,  bowel sounds present, no guarding or rebound. Extremities: No pitting edema, distal pulses 2+. Skin: Warm and dry. Musculoskeletal: No kyphosis. Neuropsychiatric: Alert and oriented x3,  affect grossly appropriate.  ECG: I personally reviewed the tracing from 02/13/2016 which showed sinus rhythm with borderline low voltage.  Recent Labwork: 02/02/2016: B Natriuretic Peptide 63.0; TSH 3.918 02/13/2016: Hemoglobin 9.8; Platelets 323 05/27/2016: BUN 19; Creat 1.55; Potassium 4.7; Sodium 138 06/11/2016: ALT 14; AST 14     Component Value Date/Time   CHOL 154 06/11/2016 1007   TRIG 174 (H) 06/11/2016 1007   HDL 47 06/11/2016 1007   CHOLHDL 3.3 06/11/2016 1007   VLDL 35 (H) 06/11/2016 1007   LDLCALC 72 06/11/2016 1007    Other Studies Reviewed Today:  Cardiac catheterization 02/05/2016 (Dr. Jordan):  Mid RCA lesion, 40% stenosed.  Ost LAD to Prox LAD lesion, 30% stenosed.  Mid LAD to Dist LAD lesion, 60% stenosed.  Ost 2nd Mrg to 2nd Mrg lesion, 90% stenosed.  Prox RCA to Mid RCA lesion, 95% stenosed. Post intervention, there is a 0% residual stenosis.  1. 2 vessel obstructive CAD 2. Elevated LV EDP 3. Successful stenting of the mid RCA with a DES.  Plan: DAPT for one year. Anticipate DC in am. RCA was the culprit lesion. There is severe disease in the second OM that was present in 2013. This was previously felt to be too small for PCI but today vessel appears to be a good size and is amenable to intervention with  PCI. Given renal dysfunction I recommend staging this procedure and having her return in 1-2 weeks for PCI of the OM if renal function is stable.   Cardiac catheterization 02/20/2016 (Dr. Smith): 1. Mid RCA lesion, 40% stenosed. 2. Ost LAD to Prox LAD lesion, 30% stenosed. 3. Mid LAD to Dist LAD lesion, 60% stenosed. 4. Ost 2nd Mrg to 2nd Mrg lesion, 90% stenosed. 5. 2nd Mrg lesion, 99% stenosed.   Decided against PCI because of the diffuse nature of disease and small vessel caliber noted in the obtuse marginal. My opinion is that this vessel is best treated with medical therapy.  Widely patent recently placed right coronary stent. Moderate diffuse disease beyond the stent and the right coronary. Moderate diffuse disease throughout the mid to distal LAD and severe disease in the first diagonal.  Recommendations:   Continue medical therapy including long-term dual antiplatelet therapy.  Echocardiogram 02/03/2016: Study Conclusions  - Left ventricle: The cavity size was normal. Wall thickness was   increased in a pattern of moderate LVH. Systolic function was   vigorous. The estimated ejection fraction was in the range of 65%   to 70%. Basal inferior hypokinesis. Doppler parameters are   consistent with abnormal left ventricular relaxation (grade 1   diastolic dysfunction). - Aortic valve: Poorly visualized. There was mild stenosis. Mean   gradient (S): 10 mm Hg. - Mitral valve: There was trivial regurgitation. - Right ventricle: The cavity size was normal. Systolic function   was normal. - Tricuspid valve: Peak RV-RA gradient (S): 15 mm Hg. - Pulmonic valve: Poorly visualized. Mildly elevated peak gradient,   ?mild pulmonic stenosis. Peak gradient (S): 25 mm Hg. - Pulmonary arteries: PA peak pressure: 18 mm Hg (S). - Inferior vena cava: The vessel was normal in size. The   respirophasic diameter changes were in the normal range (>= 50%),   consistent with normal central  venous pressure. - Pericardium, extracardiac: Prominent epicardial adipose tissue.  Impressions:  - Normal LV size with moderate LV hypertrophy. EF 65-70%. Normal RV   size and systolic function. There were elevated gradients across   both aortic and pulmonic valves,   suggesting mild stenosis versus   high flow. Neither valve was well-visualized.  Assessment and Plan:  1. CAD status post DES to the RCA in March 2017 with medically managed OM 2 disease. Reports no progressive angina symptoms on medical therapy and we will continue with observation and regular exercise plan.  2. Mild asymptomatic aortic stenosis. No change on examination.  3. Probable chronic diastolic heart fair. Agree with addition of low-dose Lasix.  4. Type 1 diabetes mellitus, follows with endocrinology.  5. Hyperlipidemia, on Lipitor.  Current medicines were reviewed with the patient today.  Disposition: Follow-up in 6 months.  Signed,  G. , MD, FACC 12/16/2016 11:54 AM    [facility] [place] [address], [place], [place] [address] Phone: [phone]; Fax: [phone]  

## 2016-12-16 NOTE — Patient Instructions (Signed)
Your physician wants you to follow-up in: 6 months with Dr McDowell You will receive a reminder letter in the mail two months in advance. If you don't receive a letter, please call our office to schedule the follow-up appointment.     Your physician recommends that you continue on your current medications as directed. Please refer to the Current Medication list given to you today.    If you need a refill on your cardiac medications before your next appointment, please call your pharmacy.     Thank you for choosing [facility] !        

## 2016-12-23 ENCOUNTER — Ambulatory Visit: Payer: Medicare Other | Admitting: Audiology

## 2017-01-10 ENCOUNTER — Ambulatory Visit: Payer: Medicare Other | Admitting: Audiology

## 2017-02-26 ENCOUNTER — Ambulatory Visit: Payer: Medicare Other | Admitting: Audiology

## 2017-03-17 ENCOUNTER — Telehealth: Payer: Self-pay | Admitting: Orthopedic Surgery

## 2017-03-17 NOTE — Telephone Encounter (Signed)
Call received from patient folllowing Emergency room visit at [facility] for problem of knee/leg pain.  States had Xrays there, "which showed to be negative" - relayed upon receipt of notes, reports, and films from [facility], to call back and we'll be glad to schedule appointment, most likely with Dr Romeo Apple.  States she has been seen in past by Dr Hilda Lias, but aware Dr Hilda Lias is out of office until next Tuesday. Patient will call back.  Her ph# is [phone].

## 2017-03-21 ENCOUNTER — Encounter: Payer: Self-pay | Admitting: Orthopedic Surgery

## 2017-03-21 ENCOUNTER — Ambulatory Visit (INDEPENDENT_AMBULATORY_CARE_PROVIDER_SITE_OTHER): Payer: Medicare Other | Admitting: Orthopedic Surgery

## 2017-03-21 VITALS — BP 142/67 | HR 66 | Ht 65.0 in | Wt 216.1 lb

## 2017-03-21 DIAGNOSIS — S8002XA Contusion of left knee, initial encounter: Secondary | ICD-10-CM

## 2017-03-21 MED ORDER — TRAMADOL HCL 50 MG PO TABS: 50.0000 mg | ORAL_TABLET | Freq: Four times a day (QID) | ORAL | 0 refills | Status: DC | PRN

## 2017-03-21 NOTE — Patient Instructions (Signed)
WEIGHT BEAR AS TOLERATED WITH WALKER

## 2017-03-21 NOTE — Progress Notes (Signed)
NEW PATIENT OFFICE VISIT    Chief Complaint  Patient presents with  . Knee Pain    Left knee pain, fell on 03-15-17, [place].     HPI 66 year old female presents with new onset pain over the lateral side of her left knee when she tripped in her yard and landed on the lateral side of the knee. She complains of severe pain which prevents her from weightbearing fully without a walker over the lateral side of the knee especially around the fibula. The pain is constant worse with walking or attempting to walk. She tried ibuprofen it makes her nauseous. Tylenol Extra Strength does not relieve the pain Review of Systems  Respiratory: Negative for shortness of breath.   Skin: Negative for itching and rash.  Neurological: Negative for tingling.     Past Medical History:  Diagnosis Date  . Aortic stenosis, mild    a. per echo Jan 2013; EF is 61%  . Asthma   . CAD (coronary artery disease)    a. 11/2011 abnl MV; b. 11/2011 Cath: No obstructive disease in the large caliber vessels, disease in the small OM2 and distal LAD, not amenable to PCI or grafting; c. 01/2016 PCI: LM nl, LAD 30ost, 4159m, LCX nl, OM1 small, OM2 90 diff, OM3 small, RCA  95p/m 2.75 x 24 Promus Premier DES, 6254m. Severe OM2 managed medically.  . Cardiac enlargement   . Carotid stenosis, bilateral    a. per doppler Jan 2013; 60-79% RICA, 40-59% LICA  . CKD (chronic kidney disease), stage III   . GERD (gastroesophageal reflux disease)   . Hyperlipidemia   . Hypertensive heart disease   . Hypothyroid   . Obese   . Type I diabetes mellitus (HCC)     Past Surgical History:  Procedure Laterality Date  . ABDOMINAL HYSTERECTOMY    . APPENDECTOMY    . CARDIAC CATHETERIZATION  Jan 2013   No major obstructive disease in the large caliber vessels, with disease in a small OM2 and distal LAD; not amenable to PCI or grafting.   Marland Kitchen. CARDIAC CATHETERIZATION N/A 02/05/2016   Procedure: Left Heart Cath and Coronary  Angiography;  Surgeon: Peter M SwazilandJordan, MD;  [facility];  Service: Cardiovascular;  Laterality: N/A;  . CARDIAC CATHETERIZATION  02/05/2016   Procedure: Coronary Stent Intervention;  Surgeon: Peter M SwazilandJordan, MD;  [facility];  Service: Cardiovascular;;  . CARDIAC CATHETERIZATION N/A 02/20/2016   Procedure: Coronary Stent Intervention;  Surgeon: Lyn RecordsHenry W Smith, MD;  [facility];  Service: Cardiovascular;  Laterality: N/A;  . CATARACT EXTRACTION  08/2016  . KNEE ARTHROSCOPY  06/24/2012   Procedure: ARTHROSCOPY KNEE;  Surgeon: Nestor LewandowskyFrank J Rowan, MD;  Location: [facility];  Service: Orthopedics;  Laterality: Right;  DEBRIDEMENT OF CHONDROMALACIA  . KNEE SURGERY    . TONSILLECTOMY      Family History  Problem Relation Age of Onset  . Coronary artery disease Brother     CABG 7756  . Heart disease Brother   . Coronary artery disease Brother     CABG 3658   Social History  Substance Use Topics  . Smoking status: Never Smoker  . Smokeless tobacco: Never Used  . Alcohol use No    BP (!) 142/67   Pulse 66   Ht 5\' 5"  (1.651 m)   Wt 216 lb 0.8 oz (98 kg)   BMI 35.95 kg/m  Physical Exam  Constitutional: She is oriented to person, place, and time. She appears well-developed and well-nourished.  Neurological: She is alert and oriented to person, place, and time.  Psychiatric: She has a normal mood and affect.  Vitals reviewed.   Ortho Exam Right knee alignment looks normal there is no swelling. Range of motion is full. Muscle tone is normal. Stability tests normal. Skin normal. Neurovascular exam normal.  Left knee mild tenderness over the medial joint line exquisite tenderness over the fibula mild tenderness over the lateral joint line no joint effusion. Her active range of motion is 10-100 her muscle tone is normal quadriceps strength is normal stability test and anterior posterior plane the medial lateral plane were normal the skin  was intact without rash or palpable nodules and the neurovascular exam was intact   No orders of the defined types were placed in this encounter.   Encounter Diagnosis  Name Primary?  . Contusion of left knee, initial encounter Yes     PLAN:   Protection rest ice compression and elevation  Return in 2 weeks recheck. X-ray from [place] on a disc showed a possible hairline fracture of the fibula

## 2017-03-26 ENCOUNTER — Other Ambulatory Visit: Payer: Self-pay | Admitting: Adult Health

## 2017-04-07 ENCOUNTER — Ambulatory Visit: Payer: Medicare Other | Admitting: Orthopedic Surgery

## 2017-04-18 ENCOUNTER — Telehealth: Payer: Self-pay | Admitting: Orthopedic Surgery

## 2017-04-18 ENCOUNTER — Ambulatory Visit: Payer: Medicare Other | Admitting: Orthopedic Surgery

## 2017-04-18 NOTE — Telephone Encounter (Signed)
Patient called to cancel today's appointment, 04/18/17, due to illness - states became ill through the night. Offered re-schedule to very first available date; patient elects to wait and call back to re-schedule.  States doing better.

## 2017-05-11 ENCOUNTER — Inpatient Hospital Stay (HOSPITAL_COMMUNITY): Payer: Medicare Other

## 2017-05-11 ENCOUNTER — Encounter (HOSPITAL_COMMUNITY): Payer: Self-pay

## 2017-05-11 ENCOUNTER — Inpatient Hospital Stay (HOSPITAL_COMMUNITY)
Admission: AD | Admit: 2017-05-11 | Discharge: 2017-05-13 | DRG: 280 | Disposition: A | Discharge status: Home / Self Care | Payer: Medicare Other | Source: Other Acute Inpatient Hospital | Attending: Family Medicine | Admitting: Family Medicine

## 2017-05-11 DIAGNOSIS — I272 Pulmonary hypertension, unspecified: Secondary | ICD-10-CM | POA: Diagnosis present

## 2017-05-11 DIAGNOSIS — F419 Anxiety disorder, unspecified: Secondary | ICD-10-CM | POA: Diagnosis present

## 2017-05-11 DIAGNOSIS — Z6837 Body mass index (BMI) 37.0-37.9, adult: Secondary | ICD-10-CM | POA: Diagnosis not present

## 2017-05-11 DIAGNOSIS — Z7902 Long term (current) use of antithrombotics/antiplatelets: Secondary | ICD-10-CM

## 2017-05-11 DIAGNOSIS — I5031 Acute diastolic (congestive) heart failure: Secondary | ICD-10-CM | POA: Diagnosis not present

## 2017-05-11 DIAGNOSIS — E1122 Type 2 diabetes mellitus with diabetic chronic kidney disease: Secondary | ICD-10-CM | POA: Diagnosis not present

## 2017-05-11 DIAGNOSIS — I2510 Atherosclerotic heart disease of native coronary artery without angina pectoris: Secondary | ICD-10-CM | POA: Diagnosis present

## 2017-05-11 DIAGNOSIS — R079 Chest pain, unspecified: Secondary | ICD-10-CM | POA: Diagnosis not present

## 2017-05-11 DIAGNOSIS — T380X5A Adverse effect of glucocorticoids and synthetic analogues, initial encounter: Secondary | ICD-10-CM | POA: Diagnosis present

## 2017-05-11 DIAGNOSIS — E8881 Metabolic syndrome: Secondary | ICD-10-CM | POA: Diagnosis present

## 2017-05-11 DIAGNOSIS — Z955 Presence of coronary angioplasty implant and graft: Secondary | ICD-10-CM

## 2017-05-11 DIAGNOSIS — Z880 Allergy status to penicillin: Secondary | ICD-10-CM

## 2017-05-11 DIAGNOSIS — J9691 Respiratory failure, unspecified with hypoxia: Secondary | ICD-10-CM | POA: Diagnosis present

## 2017-05-11 DIAGNOSIS — I10 Essential (primary) hypertension: Secondary | ICD-10-CM | POA: Diagnosis not present

## 2017-05-11 DIAGNOSIS — Z79899 Other long term (current) drug therapy: Secondary | ICD-10-CM

## 2017-05-11 DIAGNOSIS — E785 Hyperlipidemia, unspecified: Secondary | ICD-10-CM | POA: Diagnosis present

## 2017-05-11 DIAGNOSIS — J45909 Unspecified asthma, uncomplicated: Secondary | ICD-10-CM | POA: Diagnosis present

## 2017-05-11 DIAGNOSIS — E039 Hypothyroidism, unspecified: Secondary | ICD-10-CM | POA: Diagnosis present

## 2017-05-11 DIAGNOSIS — E1022 Type 1 diabetes mellitus with diabetic chronic kidney disease: Secondary | ICD-10-CM | POA: Diagnosis present

## 2017-05-11 DIAGNOSIS — Z888 Allergy status to other drugs, medicaments and biological substances status: Secondary | ICD-10-CM | POA: Diagnosis not present

## 2017-05-11 DIAGNOSIS — J9601 Acute respiratory failure with hypoxia: Secondary | ICD-10-CM | POA: Diagnosis present

## 2017-05-11 DIAGNOSIS — N183 Chronic kidney disease, stage 3 unspecified: Secondary | ICD-10-CM | POA: Diagnosis present

## 2017-05-11 DIAGNOSIS — F329 Major depressive disorder, single episode, unspecified: Secondary | ICD-10-CM | POA: Diagnosis present

## 2017-05-11 DIAGNOSIS — J189 Pneumonia, unspecified organism: Secondary | ICD-10-CM | POA: Diagnosis present

## 2017-05-11 DIAGNOSIS — I5043 Acute on chronic combined systolic (congestive) and diastolic (congestive) heart failure: Secondary | ICD-10-CM | POA: Diagnosis present

## 2017-05-11 DIAGNOSIS — I214 Non-ST elevation (NSTEMI) myocardial infarction: Principal | ICD-10-CM | POA: Diagnosis present

## 2017-05-11 DIAGNOSIS — E1065 Type 1 diabetes mellitus with hyperglycemia: Secondary | ICD-10-CM | POA: Diagnosis present

## 2017-05-11 DIAGNOSIS — R0602 Shortness of breath: Secondary | ICD-10-CM | POA: Diagnosis present

## 2017-05-11 DIAGNOSIS — I130 Hypertensive heart and chronic kidney disease with heart failure and stage 1 through stage 4 chronic kidney disease, or unspecified chronic kidney disease: Secondary | ICD-10-CM | POA: Diagnosis present

## 2017-05-11 DIAGNOSIS — Z7982 Long term (current) use of aspirin: Secondary | ICD-10-CM

## 2017-05-11 DIAGNOSIS — Z881 Allergy status to other antibiotic agents status: Secondary | ICD-10-CM | POA: Diagnosis not present

## 2017-05-11 DIAGNOSIS — E669 Obesity, unspecified: Secondary | ICD-10-CM | POA: Diagnosis present

## 2017-05-11 DIAGNOSIS — K219 Gastro-esophageal reflux disease without esophagitis: Secondary | ICD-10-CM | POA: Diagnosis present

## 2017-05-11 DIAGNOSIS — I350 Nonrheumatic aortic (valve) stenosis: Secondary | ICD-10-CM | POA: Diagnosis present

## 2017-05-11 DIAGNOSIS — I6523 Occlusion and stenosis of bilateral carotid arteries: Secondary | ICD-10-CM | POA: Diagnosis present

## 2017-05-11 LAB — COMPREHENSIVE METABOLIC PANEL
ALK PHOS: 92 U/L (ref 38–126)
ALT: 23 U/L (ref 14–54)
AST: 52 U/L — ABNORMAL HIGH (ref 15–41)
Albumin: 3.7 g/dL (ref 3.5–5.0)
Anion gap: 11 (ref 5–15)
BUN: 20 mg/dL (ref 6–20)
CALCIUM: 9.3 mg/dL (ref 8.9–10.3)
CO2: 22 mmol/L (ref 22–32)
CREATININE: 1.74 mg/dL — AB (ref 0.44–1.00)
Chloride: 101 mmol/L (ref 101–111)
GFR calc non Af Amer: 30 mL/min — ABNORMAL LOW (ref >60)
GFR, EST AFRICAN AMERICAN: 35 mL/min — AB (ref >60)
Glucose, Bld: 335 mg/dL — ABNORMAL HIGH (ref 65–99)
Potassium: 4.4 mmol/L (ref 3.5–5.1)
SODIUM: 134 mmol/L — AB (ref 135–145)
TOTAL PROTEIN: 7.1 g/dL (ref 6.5–8.1)
Total Bilirubin: 0.4 mg/dL (ref 0.3–1.2)

## 2017-05-11 LAB — CBC WITH DIFFERENTIAL/PLATELET
Basophils Absolute: 0 10*3/uL (ref 0.0–0.1)
Basophils Relative: 0 %
EOS ABS: 0 10*3/uL (ref 0.0–0.7)
Eosinophils Relative: 0 %
HCT: 37.2 % (ref 36.0–46.0)
Hemoglobin: 11.8 g/dL — ABNORMAL LOW (ref 12.0–15.0)
LYMPHS ABS: 0.5 10*3/uL — AB (ref 0.7–4.0)
Lymphocytes Relative: 4 %
MCH: 29.8 pg (ref 26.0–34.0)
MCHC: 31.7 g/dL (ref 30.0–36.0)
MCV: 93.9 fL (ref 78.0–100.0)
MONOS PCT: 1 %
Monocytes Absolute: 0.1 10*3/uL (ref 0.1–1.0)
NEUTROS PCT: 95 %
Neutro Abs: 11.3 10*3/uL — ABNORMAL HIGH (ref 1.7–7.7)
Platelets: 207 10*3/uL (ref 150–400)
RBC: 3.96 MIL/uL (ref 3.87–5.11)
RDW: 14.4 % (ref 11.5–15.5)
WBC: 11.9 10*3/uL — AB (ref 4.0–10.5)

## 2017-05-11 LAB — GLUCOSE, CAPILLARY
Glucose-Capillary: 405 mg/dL — ABNORMAL HIGH (ref 65–99)
Glucose-Capillary: 421 mg/dL — ABNORMAL HIGH (ref 65–99)
Glucose-Capillary: 296 mg/dL — ABNORMAL HIGH (ref 65–99)

## 2017-05-11 LAB — TROPONIN I
Troponin I: 4.62 ng/mL (ref <0.03)
Troponin I: 8.87 ng/mL (ref <0.03)
Troponin I: 11.71 ng/mL (ref <0.03)

## 2017-05-11 LAB — SURGICAL PCR SCREEN
MRSA, PCR: NEGATIVE
Staphylococcus aureus: NEGATIVE

## 2017-05-11 LAB — HEPARIN LEVEL (UNFRACTIONATED): Heparin Unfractionated: 0.43 IU/mL (ref 0.30–0.70)

## 2017-05-11 LAB — BRAIN NATRIURETIC PEPTIDE: B NATRIURETIC PEPTIDE 5: 478.5 pg/mL — AB (ref 0.0–100.0)

## 2017-05-11 LAB — TSH: TSH: 0.04 u[IU]/mL — ABNORMAL LOW (ref 0.350–4.500)

## 2017-05-11 LAB — PHOSPHORUS: Phosphorus: 3.3 mg/dL (ref 2.5–4.6)

## 2017-05-11 LAB — MAGNESIUM: Magnesium: 1.8 mg/dL (ref 1.7–2.4)

## 2017-05-11 MED ORDER — SODIUM CHLORIDE 0.9 % IV SOLN
1.0000 g | Freq: Two times a day (BID) | INTRAVENOUS | Status: DC
  Administered 2017-05-11 – 2017-05-12 (×2): 1 g via INTRAVENOUS
  Filled 2017-05-11 (×2): qty 1

## 2017-05-11 MED ORDER — FUROSEMIDE 10 MG/ML IJ SOLN
60.0000 mg | Freq: Once | INTRAMUSCULAR | Status: AC
  Administered 2017-05-11: 60 mg via INTRAVENOUS
  Filled 2017-05-11: qty 6

## 2017-05-11 MED ORDER — ATORVASTATIN CALCIUM 80 MG PO TABS
80.0000 mg | ORAL_TABLET | Freq: Every day | ORAL | Status: DC
  Administered 2017-05-11 – 2017-05-12 (×2): 80 mg via ORAL
  Filled 2017-05-11 (×2): qty 1

## 2017-05-11 MED ORDER — ACETAMINOPHEN 325 MG PO TABS: 650.0000 mg | ORAL_TABLET | ORAL | Status: DC | PRN

## 2017-05-11 MED ORDER — FAMOTIDINE 20 MG PO TABS: 20.0000 mg | ORAL_TABLET | Freq: Two times a day (BID) | ORAL | Status: DC

## 2017-05-11 MED ORDER — DIAZEPAM 5 MG PO TABS
5.0000 mg | ORAL_TABLET | Freq: Four times a day (QID) | ORAL | Status: DC | PRN
  Administered 2017-05-11: 5 mg via ORAL
  Filled 2017-05-11: qty 1

## 2017-05-11 MED ORDER — HYDRALAZINE HCL 50 MG PO TABS
50.0000 mg | ORAL_TABLET | Freq: Three times a day (TID) | ORAL | Status: DC
  Administered 2017-05-11 – 2017-05-13 (×5): 50 mg via ORAL
  Filled 2017-05-11 (×6): qty 1

## 2017-05-11 MED ORDER — CARVEDILOL 25 MG PO TABS
25.0000 mg | ORAL_TABLET | Freq: Two times a day (BID) | ORAL | Status: DC
  Administered 2017-05-11 – 2017-05-13 (×3): 25 mg via ORAL
  Filled 2017-05-11 (×4): qty 1

## 2017-05-11 MED ORDER — ARIPIPRAZOLE 10 MG PO TABS
10.0000 mg | ORAL_TABLET | Freq: Every evening | ORAL | Status: DC
  Administered 2017-05-11 – 2017-05-12 (×2): 10 mg via ORAL
  Filled 2017-05-11: qty 1
  Filled 2017-05-11: qty 2
  Filled 2017-05-11: qty 1

## 2017-05-11 MED ORDER — SODIUM CHLORIDE 0.9 % IV SOLN: 250.0000 mL | INTRAVENOUS | Status: DC | PRN

## 2017-05-11 MED ORDER — HEPARIN (PORCINE) IN NACL 100-0.45 UNIT/ML-% IJ SOLN
1050.0000 [IU]/h | INTRAMUSCULAR | Status: DC
  Administered 2017-05-11 – 2017-05-12 (×2): 1050 [IU]/h via INTRAVENOUS
  Filled 2017-05-11 (×2): qty 250

## 2017-05-11 MED ORDER — PANTOPRAZOLE SODIUM 40 MG PO TBEC
80.0000 mg | DELAYED_RELEASE_TABLET | Freq: Every day | ORAL | Status: DC
  Administered 2017-05-11 – 2017-05-13 (×3): 80 mg via ORAL
  Filled 2017-05-11 (×3): qty 2

## 2017-05-11 MED ORDER — HYDRALAZINE HCL 20 MG/ML IJ SOLN: 10.0000 mg | Freq: Three times a day (TID) | INTRAMUSCULAR | Status: DC | PRN

## 2017-05-11 MED ORDER — NITROGLYCERIN 0.4 MG SL SUBL: 0.4000 mg | SUBLINGUAL_TABLET | SUBLINGUAL | Status: DC | PRN

## 2017-05-11 MED ORDER — SODIUM CHLORIDE 0.9% FLUSH
3.0000 mL | Freq: Two times a day (BID) | INTRAVENOUS | Status: DC
  Administered 2017-05-11 (×2): 3 mL via INTRAVENOUS

## 2017-05-11 MED ORDER — SODIUM CHLORIDE 0.9 % IV SOLN
2000.0000 mg | Freq: Once | INTRAVENOUS | Status: AC
  Administered 2017-05-11: 2000 mg via INTRAVENOUS
  Filled 2017-05-11 (×2): qty 2000

## 2017-05-11 MED ORDER — ATORVASTATIN CALCIUM 80 MG PO TABS: 80.0000 mg | ORAL_TABLET | Freq: Every day | ORAL | Status: DC

## 2017-05-11 MED ORDER — VANCOMYCIN HCL 10 G IV SOLR
1250.0000 mg | INTRAVENOUS | Status: DC
  Administered 2017-05-12: 1250 mg via INTRAVENOUS
  Filled 2017-05-11: qty 1250

## 2017-05-11 MED ORDER — SODIUM CHLORIDE 0.9 % IV SOLN
1.0000 g | Freq: Once | INTRAVENOUS | Status: AC
  Administered 2017-05-11: 1 g via INTRAVENOUS
  Filled 2017-05-11 (×2): qty 1

## 2017-05-11 MED ORDER — GUAIFENESIN ER 600 MG PO TB12
600.0000 mg | ORAL_TABLET | Freq: Two times a day (BID) | ORAL | Status: DC
  Administered 2017-05-11 – 2017-05-13 (×5): 600 mg via ORAL
  Filled 2017-05-11 (×5): qty 1

## 2017-05-11 MED ORDER — CARVEDILOL 12.5 MG PO TABS: 12.5000 mg | ORAL_TABLET | Freq: Two times a day (BID) | ORAL | Status: DC

## 2017-05-11 MED ORDER — ASPIRIN 81 MG PO CHEW: 81.0000 mg | CHEWABLE_TABLET | Freq: Every day | ORAL | Status: DC

## 2017-05-11 MED ORDER — CLOPIDOGREL BISULFATE 75 MG PO TABS
75.0000 mg | ORAL_TABLET | Freq: Every day | ORAL | Status: DC
  Administered 2017-05-12 – 2017-05-13 (×2): 75 mg via ORAL
  Filled 2017-05-11 (×2): qty 1

## 2017-05-11 MED ORDER — INSULIN GLARGINE 100 UNIT/ML ~~LOC~~ SOLN
20.0000 [IU] | Freq: Every day | SUBCUTANEOUS | Status: DC
  Administered 2017-05-11: 20 [IU] via SUBCUTANEOUS
  Filled 2017-05-11 (×2): qty 0.2

## 2017-05-11 MED ORDER — ASPIRIN 81 MG PO CHEW
81.0000 mg | CHEWABLE_TABLET | ORAL | Status: AC
  Administered 2017-05-12: 81 mg via ORAL
  Filled 2017-05-11: qty 1

## 2017-05-11 MED ORDER — MORPHINE SULFATE (PF) 2 MG/ML IV SOLN: 2.0000 mg | INTRAVENOUS | Status: DC | PRN

## 2017-05-11 MED ORDER — HEPARIN BOLUS VIA INFUSION
4000.0000 [IU] | Freq: Once | INTRAVENOUS | Status: AC
  Administered 2017-05-11: 4000 [IU] via INTRAVENOUS
  Filled 2017-05-11: qty 4000

## 2017-05-11 MED ORDER — VANCOMYCIN HCL 10 G IV SOLR: 2000.0000 mg | Freq: Once | INTRAVENOUS | Status: DC

## 2017-05-11 MED ORDER — PREDNISONE 20 MG PO TABS
40.0000 mg | ORAL_TABLET | Freq: Every day | ORAL | Status: DC
  Administered 2017-05-11 – 2017-05-12 (×2): 40 mg via ORAL
  Filled 2017-05-11 (×2): qty 2

## 2017-05-11 MED ORDER — ONDANSETRON HCL 4 MG/2ML IJ SOLN: 4.0000 mg | Freq: Four times a day (QID) | INTRAMUSCULAR | Status: DC | PRN

## 2017-05-11 MED ORDER — FUROSEMIDE 20 MG PO TABS: 20.0000 mg | ORAL_TABLET | Freq: Every day | ORAL | Status: DC

## 2017-05-11 MED ORDER — ALBUTEROL SULFATE (2.5 MG/3ML) 0.083% IN NEBU: 2.5000 mg | INHALATION_SOLUTION | RESPIRATORY_TRACT | Status: DC | PRN

## 2017-05-11 MED ORDER — INSULIN REGULAR HUMAN (CONC) 500 UNIT/ML ~~LOC~~ SOPN
50.0000 [IU] | PEN_INJECTOR | Freq: Three times a day (TID) | SUBCUTANEOUS | Status: DC
  Filled 2017-05-11: qty 3

## 2017-05-11 MED ORDER — ASPIRIN 81 MG PO CHEW: 324.0000 mg | CHEWABLE_TABLET | Freq: Once | ORAL | Status: DC

## 2017-05-11 MED ORDER — SODIUM CHLORIDE 0.9% FLUSH: 3.0000 mL | INTRAVENOUS | Status: DC | PRN

## 2017-05-11 MED ORDER — IPRATROPIUM-ALBUTEROL 0.5-2.5 (3) MG/3ML IN SOLN
3.0000 mL | Freq: Four times a day (QID) | RESPIRATORY_TRACT | Status: DC
  Administered 2017-05-11 (×2): 3 mL via RESPIRATORY_TRACT
  Filled 2017-05-11 (×2): qty 3

## 2017-05-11 MED ORDER — CLOPIDOGREL BISULFATE 75 MG PO TABS
75.0000 mg | ORAL_TABLET | Freq: Every day | ORAL | Status: DC
  Administered 2017-05-11: 75 mg via ORAL
  Filled 2017-05-11: qty 1

## 2017-05-11 MED ORDER — SODIUM CHLORIDE 0.9 % IV SOLN: INTRAVENOUS | Status: DC

## 2017-05-11 MED ORDER — INSULIN ASPART 100 UNIT/ML ~~LOC~~ SOLN
0.0000 [IU] | Freq: Three times a day (TID) | SUBCUTANEOUS | Status: DC
  Administered 2017-05-11 (×2): 20 [IU] via SUBCUTANEOUS

## 2017-05-11 MED ORDER — VORTIOXETINE HBR 20 MG PO TABS
20.0000 mg | ORAL_TABLET | Freq: Every day | ORAL | Status: DC
  Administered 2017-05-12 – 2017-05-13 (×2): 20 mg via ORAL
  Filled 2017-05-11 (×4): qty 20

## 2017-05-11 NOTE — Progress Notes (Addendum)
Pharmacy Antibiotic Note  Teresa Hart is a 66 y.o. female admitted on 05/11/2017 with pneumonia.  Pharmacy has been consulted for vanc/meropenem dosing. -SCr 1.74, CrCl ~38 -WBC 11.9, afebrile -Pt has documented severe hives and swelling to PCNs so MD wanted to avoid cephalosporins. MD also had concerns regarding levaquin given cardiac history. -Concern for multilobar PNA possibly being MRSA, also has recent hospitalizations  Plan: -Vanc 2g x1, then 1250mg  q24h -Meropenem 1g x1, then 1g q12h -Monitor renal function, any cultures, LOT, [facility] -De-escalate as able  Height: 5\' 5"  (165.1 cm) Weight: 218 lb (98.9 kg) IBW/kg (Calculated) : 57  Temp (24hrs), Avg:98.4 F (36.9 C), Min:98.4 F (36.9 C), Max:98.4 F (36.9 C)  No results for input(s): WBC, CREATININE, LATICACIDVEN, VANCOTROUGH, VANCOPEAK, VANCORANDOM, GENTTROUGH, GENTPEAK, GENTRANDOM, TOBRATROUGH, TOBRAPEAK, TOBRARND, AMIKACINPEAK, AMIKACINTROU, AMIKACIN in the last 168 hours.  CrCl cannot be calculated (Patient's most recent lab result is older than the maximum 21 days allowed.).    Allergies  Allergen Reactions  . Brilinta [Ticagrelor] Anaphylaxis  . Amoxicillin-Pot Clavulanate Nausea Only  . Crestor [Rosuvastatin]     Muscle spasms and cramps  . Codeine     REACTION: nausea  . Penicillins     REACTION: major hives,swelling   -Vanc 6/24 >> -Meropenem 6/24 >>  -no cultures sent at this time  Thank you for allowing pharmacy to be a part of this patient's care.  Gwyndolyn KaufmanKai Lodema Parma Bernette Redbird(Kenny), PharmD  PGY1 Pharmacy Resident Pager: [phone] 05/11/2017 10:36 AM

## 2017-05-11 NOTE — Progress Notes (Signed)
ANTICOAGULATION CONSULT NOTE - Initial Consult  Pharmacy Consult for heparin Indication: chest pain/ACS  Allergies  Allergen Reactions  . Brilinta [Ticagrelor] Anaphylaxis  . Amoxicillin-Pot Clavulanate Nausea Only  . Crestor [Rosuvastatin]     Muscle spasms and cramps  . Codeine     REACTION: nausea  . Penicillins     REACTION: major hives,swelling    Patient Measurements: Height: 5\' 5"  (165.1 cm) Weight: 218 lb (98.9 kg) IBW/kg (Calculated) : 57 Heparin Dosing Weight: 79kg  Vital Signs: Temp: 98.4 F (36.9 C) (06/24 0900) Temp Source: Oral (06/24 0900) BP: 125/55 (06/24 0900) Pulse Rate: 91 (06/24 0900)  Labs:  Recent Labs  05/11/17 1015  HGB 11.8*  HCT 37.2  PLT 207  CREATININE 1.74*  TROPONINI 4.62*    Estimated Creatinine Clearance: 38.1 mL/min (A) (by C-G formula based on SCr of 1.74 mg/dL (H)).   Medical History: Past Medical History:  Diagnosis Date  . Aortic stenosis, mild    a. per echo Jan 2013; EF is 61%  . Asthma   . CAD (coronary artery disease)    a. 11/2011 abnl MV; b. 11/2011 Cath: No obstructive disease in the large caliber vessels, disease in the small OM2 and distal LAD, not amenable to PCI or grafting; c. 01/2016 PCI: LM nl, LAD 30ost, 1954m, LCX nl, OM1 small, OM2 90 diff, OM3 small, RCA  95p/m 2.75 x 24 Promus Premier DES, 7147m. Severe OM2 managed medically.  . Cardiac enlargement   . Carotid stenosis, bilateral    a. per doppler Jan 2013; 60-79% RICA, 40-59% LICA  . CKD (chronic kidney disease), stage III   . GERD (gastroesophageal reflux disease)   . Hyperlipidemia   . Hypertensive heart disease   . Hypothyroid   . Obese   . Type I diabetes mellitus (HCC)     Assessment: [age] YOF with history of CAD transferred from OSH following de-sats to 75%. Initial troponin was 4.62 and heparin is to be started. -Hgb 11.8, Plts 207, no signs of bleeding  Goal of Therapy:  Heparin level 0.3-0.7 units/ml Monitor platelets by anticoagulation  protocol: Yes   Plan:  -Heparin 4000 units x1, then 1050 units/hr -6-hr HL -Daily CBC/HL, monitor signs of bleeding  Gwyndolyn KaufmanKai Aurielle Slingerland Bernette Redbird(Kenny), PharmD  PGY1 Pharmacy Resident Pager: [phone] 05/11/2017 12:01 PM

## 2017-05-11 NOTE — H&P (Signed)
[facility] History and Physical  Virda Betters Talaga WUJ:[id number] DOB: January 26, 1951 DOA: 05/11/2017  Referring physician:  PCP: Abran Richard, PA-C   Chief Complaint: "I went to the ED because I couldn't breathe."  HPI: Teresa Hart is a 66 y.o. female  with past medical history significant for coronary artery disease, CK D, hypothyroid and diabetes presents to outside hospital emergency room for evaluation of shortness of breath. Patient states that she's had a cough for a little over a week. She did not seek medical care. She felt it was allergies. Went to the emergency room last night due to sudden onset of shortness of breath and crushing chest pain. Patient did try to take a nitroglycerin at home which minimally helped her symptoms. Patient denies any sick contacts. Patient has had pneumonia before. Patient has had a cough productive of yellow-green sputum.  ED course- At outside hospital patient was noted to have an O2 saturation of 75% on room air. Vital signs otherwise normal. Patient placed on 3 L nasal cannula. Patient responded. Chest x-ray showed bilateral pneumonia. Troponin elevated to 0.673. Patient was given Levaquin, nebulizer treatment, IV steroids and transferred to [facility] was requested.  Review of Systems:  As per HPI otherwise 10 point review of systems negative.    Past Medical History:  Diagnosis Date  . Aortic stenosis, mild    a. per echo Jan 2013; EF is 61%  . Asthma   . CAD (coronary artery disease)    a. 11/2011 abnl MV; b. 11/2011 Cath: No obstructive disease in the large caliber vessels, disease in the small OM2 and distal LAD, not amenable to PCI or grafting; c. 01/2016 PCI: LM nl, LAD 30ost, 24m, LCX nl, OM1 small, OM2 90 diff, OM3 small, RCA  95p/m 2.75 x 24 Promus Premier DES, 46m. Severe OM2 managed medically.  . Cardiac enlargement   . Carotid stenosis, bilateral    a. per doppler Jan 2013; 60-79% RICA, 40-59% LICA  . CKD (chronic kidney  disease), stage III   . GERD (gastroesophageal reflux disease)   . Hyperlipidemia   . Hypertensive heart disease   . Hypothyroid   . Obese   . Type I diabetes mellitus (HCC)    Past Surgical History:  Procedure Laterality Date  . ABDOMINAL HYSTERECTOMY    . APPENDECTOMY    . CARDIAC CATHETERIZATION  Jan 2013   No major obstructive disease in the large caliber vessels, with disease in a small OM2 and distal LAD; not amenable to PCI or grafting.   Marland Kitchen CARDIAC CATHETERIZATION N/A 02/05/2016   Procedure: Left Heart Cath and Coronary Angiography;  Surgeon: Peter M Swaziland, MD;  Location: [facility] INVASIVE CV LAB;  Service: Cardiovascular;  Laterality: N/A;  . CARDIAC CATHETERIZATION  02/05/2016   Procedure: Coronary Stent Intervention;  Surgeon: Peter M Swaziland, MD;  Location: [facility] INVASIVE CV LAB;  Service: Cardiovascular;;  . CARDIAC CATHETERIZATION N/A 02/20/2016   Procedure: Coronary Stent Intervention;  Surgeon: Lyn Records, MD;  Location: [facility] INVASIVE CV LAB;  Service: Cardiovascular;  Laterality: N/A;  . CATARACT EXTRACTION  08/2016  . KNEE ARTHROSCOPY  06/24/2012   Procedure: ARTHROSCOPY KNEE;  Surgeon: Nestor Lewandowsky, MD;  Location: [facility];  Service: Orthopedics;  Laterality: Right;  DEBRIDEMENT OF CHONDROMALACIA  . KNEE SURGERY    . TONSILLECTOMY     Social History:  reports that she has never smoked. She has never used smokeless tobacco. She reports that she does not  drink alcohol or use drugs.  Allergies  Allergen Reactions  . Brilinta [Ticagrelor] Anaphylaxis  . Amoxicillin-Pot Clavulanate Nausea Only  . Crestor [Rosuvastatin]     Muscle spasms and cramps  . Codeine     REACTION: nausea  . Penicillins     REACTION: major hives,swelling    Family History  Problem Relation Age of Onset  . Coronary artery disease Brother        CABG 6956  . Heart disease Brother   . Coronary artery disease Brother        CABG 58     Prior to Admission medications   Medication Sig  Start Date End Date Taking? Authorizing Provider  ACCU-CHEK AVIVA PLUS test strip CHECK BLOOD SUGAR 4 TIMES DAILY. 02/26/16   Roma KayserNida, Gebreselassie W, MD  albuterol (PROVENTIL HFA;VENTOLIN HFA) 108 (90 BASE) MCG/ACT inhaler Inhale 2 puffs into the lungs every 6 (six) hours as needed. 11/16/12   Copland, Gwenlyn FoundJessica C, MD  ARIPiprazole (ABILIFY) 20 MG tablet Take 10 mg by mouth every evening.     [provider]  aspirin EC 81 MG tablet Take 1 tablet (81 mg total) by mouth daily. 10/29/11   Lewayne Buntingrenshaw, Brian S, MD  atorvastatin (LIPITOR) 80 MG tablet TAKE 1 TABLET BY MOUTH AT BEDTIME FOR CHOLESTEROL 03/26/17   Jodelle GrossLawrence, Kathryn M, NP  carvedilol (COREG) 25 MG tablet TAKE (1) TABLET TWICE A DAY WITH FOOD---BREAKFAST AND SUPPER. 03/26/17   Jodelle GrossLawrence, Kathryn M, NP  clopidogrel (PLAVIX) 75 MG tablet TAKE 1 TABLET BY MOUTH ONCE DAILY. 03/26/17   Jodelle GrossLawrence, Kathryn M, NP  diazepam (VALIUM) 5 MG tablet Take 5 mg by mouth every 6 (six) hours as needed for anxiety.    [provider]  furosemide (LASIX) 20 MG tablet Take 1 tablet (20 mg total) by mouth daily. 12/16/16   Jonelle SidleMcDowell, Samuel G, MD  HUMULIN R U-500 KWIKPEN 500 UNIT/ML kwikpen Inject 60 Units into the skin 3 (three) times daily with meals.  10/03/16   [provider]  hydrALAZINE (APRESOLINE) 50 MG tablet Take 1 tablet (50 mg total) by mouth 3 (three) times daily. 03/07/16   Jodelle GrossLawrence, Kathryn M, NP  Insulin Pen Needle (B-D ULTRAFINE III SHORT PEN) 31G X 8 MM MISC 1 each by Does not apply route as directed. 03/04/16   Roma KayserNida, Gebreselassie W, MD  levocetirizine (XYZAL) 5 MG tablet Take 1 tablet (5 mg total) by mouth every evening. 02/18/13   Sherren MochaShaw, Eva N, MD  Liraglutide (VICTOZA) 18 MG/3ML SOPN Inject 0.3 mLs (1.8 mg total) into the skin at bedtime. 06/28/16   Roma KayserNida, Gebreselassie W, MD  LORazepam (ATIVAN) 1 MG tablet Take 1 mg by mouth as needed for anxiety or sleep.     [provider]  nitroGLYCERIN (NITROSTAT) 0.4 MG SL tablet Place 1  tablet (0.4 mg total) under the tongue every 5 (five) minutes as needed for chest pain. 02/06/16 03/14/20  Ok AnisBerge, Christopher R, NP  omeprazole (PRILOSEC) 40 MG capsule  10/31/16   [provider]  ranitidine (ZANTAC) 150 MG tablet Take 1 tablet (150 mg total) by mouth daily. 05/06/16   Jodelle GrossLawrence, Kathryn M, NP  traMADol (ULTRAM) 50 MG tablet Take 1 tablet (50 mg total) by mouth every 6 (six) hours as needed. 03/21/17   Vickki HearingHarrison, Stanley E, MD  Vitamin D, Ergocalciferol, (DRISDOL) 50000 units CAPS capsule Take 50,000 Units by mouth every 7 (seven) days.    [provider]  Vortioxetine HBr (TRINTELLIX) 20 MG TABS  Take 20 mg by mouth daily.     [provider]   Physical Exam: There were no vitals filed for this visit.  Wt Readings from Last 3 Encounters:  03/21/17 98 kg (216 lb 0.8 oz)  12/16/16 86.6 kg (191 lb)  06/11/16 94.3 kg (208 lb)    General:  Appears calm and comfortable, alert and oriented 3, ill-appearing Eyes:  PERRL, EOMI, normal lids, iris ENT:  grossly normal hearing, lips & tongue Neck:  no LAD, masses or thyromegaly Cardiovascular:  RRR, no m/r/g. No LE edema.  Respiratory:  , Crackles, diffuse wheezes, increased work of breathing Abdomen:  soft, ntnd Skin:  no rash or induration seen on limited exam Musculoskeletal:  grossly normal tone BUE/BLE Psychiatric:  grossly normal mood and affect, speech fluent and appropriate Neurologic:  CN 2-12 grossly intact, moves all extremities in coordinated fashion.          Labs on Admission:  Basic Metabolic Panel: No results for input(s): NA, K, CL, CO2, GLUCOSE, BUN, CREATININE, CALCIUM, MG, PHOS in the last 168 hours. Liver Function Tests: No results for input(s): AST, ALT, ALKPHOS, BILITOT, PROT, ALBUMIN in the last 168 hours. No results for input(s): LIPASE, AMYLASE in the last 168 hours. No results for input(s): AMMONIA in the last 168 hours. CBC: No results for input(s): WBC, NEUTROABS, HGB, HCT,  MCV, PLT in the last 168 hours. Cardiac Enzymes: No results for input(s): CKTOTAL, CKMB, CKMBINDEX, TROPONINI in the last 168 hours.  BNP (last 3 results) No results for input(s): BNP in the last 8760 hours.  ProBNP (last 3 results) No results for input(s): PROBNP in the last 8760 hours.   Creatinine clearance cannot be calculated (Patient's most recent lab result is older than the maximum 21 days allowed.)  CBG: No results for input(s): GLUCAP in the last 168 hours.  Radiological Exams on Admission: No results found.  EKG: Outside hospital EKG no STEMI, hospital EKG has signs of anterior lateral ischemia  Assessment/Plan Principal Problem:   Respiratory failure with hypoxia (HCC) Active Problems:   Hypertension   Diabetes mellitus with stage 3 chronic kidney disease (HCC)   CKD (chronic kidney disease), stage III   Pneumonia  Acute resp failure with hypoxia/Pneumonia Scheduled DuoNeb's When necessary albuterol Antibiotic-Levaquin at outside hospital, started on Rocephin & Vanc, For what sounds like multilobular pneumonia, could be staph Oxygen therapy Continuous pulse oximetry IS RT consult Flutter valve Hx of asthma - oral steroids Get baseline CXR  ACS - serial trop ordered, initial elevated - prn EKG CP - prn moprhine CP - prn ntg cp - asa - echo ordered for AM - tele bed, cardiac monitoring - zofran prn for nausea Currently without chest pain Cardiology consult to an aware of elevation in troponin-> plan for cath Heparin ordered  CHF Cont Coreg bid Lasix per cardio recs  Hypertension When necessary hydralazine 10 mg IV as needed for severe blood pressure Cont PO hydralazine  CKD III Monitor Cr daily Cr at baseline,  1.6 1.87 on admit I&Os  Anxiety/Depression Cont vortioxetine, abilify No SI/HI Prn ativan  DM SSI ACHS Hold victoza  CAD Cont plavix  Hypothyroidism Check tsh level Not on meds No signs of hyper or  hypothyroidism  Hyperlipidemia Continue statin  GERD Cont PPI & zantc-> pepcid  Code Status: full  DVT Prophylaxis: scd Family Communication: husband sleep Disposition Plan: Pending Improvement  Status: tele inpt Haydee Salter, MD Family Medicine [facility] [email or web address] Password [email or web address]

## 2017-05-11 NOTE — Progress Notes (Signed)
ANTICOAGULATION CONSULT NOTE - follow up Pharmacy Consult for heparin Indication: chest pain/ACS  Allergies  Allergen Reactions  . Brilinta [Ticagrelor] Anaphylaxis  . Cinnamon Anaphylaxis and Other (See Comments)    Also, blisters in mouth  . Fish-Derived Products Anaphylaxis and Hives  . Other Anaphylaxis and Hives    NO TREE NUTS (Pecans and Walnuts especially)  . Penicillins Anaphylaxis, Hives and Swelling    Has patient had a PCN reaction causing immediate rash, facial/tongue/throat swelling, SOB or lightheadedness with hypotension: Yes Has patient had a PCN reaction causing severe rash involving mucus membranes or skin necrosis: No Has patient had a PCN reaction that required hospitalization: No Has patient had a PCN reaction occurring within the last 10 years: No If all of the above answers are "NO", then may proceed with Cephalosporin use.   . Strawberry Extract Anaphylaxis and Hives  . Amoxicillin-Pot Clavulanate Nausea And Vomiting  . Crestor [Rosuvastatin]     Muscle spasms and cramps  . Levaquin [Levofloxacin] Other (See Comments)    Per the patient's notes...Marland Kitchen."MD also had concerns regarding levaquin given cardiac history.."    Patient Measurements: Height: 5\' 5"  (165.1 cm) Weight: 218 lb (98.9 kg) IBW/kg (Calculated) : 57 Heparin Dosing Weight: 79kg  Vital Signs: Temp: 97.8 F (36.6 C) (06/24 1843) Temp Source: Oral (06/24 1843) BP: 134/58 (06/24 1843) Pulse Rate: 94 (06/24 1843)  Labs:  Recent Labs  05/11/17 1015 05/11/17 1435 05/11/17 1822  HGB 11.8*  --   --   HCT 37.2  --   --   PLT 207  --   --   HEPARINUNFRC  --   --  0.43  CREATININE 1.74*  --   --   TROPONINI 4.62* 8.87*  --     Estimated Creatinine Clearance: 38.1 mL/min (A) (by C-G formula based on SCr of 1.74 mg/dL (H)).   Medical History: Past Medical History:  Diagnosis Date  . Aortic stenosis, mild    a. per echo Jan 2013; EF is 61%  . Asthma   . CAD (coronary artery disease)     a. 11/2011 abnl MV; b. 11/2011 Cath: No obstructive disease in the large caliber vessels, disease in the small OM2 and distal LAD, not amenable to PCI or grafting; c. 01/2016 PCI: LM nl, LAD 30ost, 4742m, LCX nl, OM1 small, OM2 90 diff, OM3 small, RCA  95p/m 2.75 x 24 Promus Premier DES, 7077m. Severe OM2 managed medically.  . Cardiac enlargement   . Carotid stenosis, bilateral    a. per doppler Jan 2013; 60-79% RICA, 40-59% LICA  . CKD (chronic kidney disease), stage III   . GERD (gastroesophageal reflux disease)   . Hyperlipidemia   . Hypertensive heart disease   . Hypothyroid   . Obese   . Type I diabetes mellitus (HCC)     Assessment: [age] YOF with history of CAD transferred from OSH following de-sats to 75%. Initial troponin was 4.62> 8.87.  Initial 6 hr Heparin level = 0.43 on heparin 1050 units/hr -Hgb 11.8, Plts 207 this morning.  No signs of bleeding reported.  Goal of Therapy:  Heparin level 0.3-0.7 units/ml Monitor platelets by anticoagulation protocol: Yes   Plan:  -Continue IV Heparin 1050 units/hr -6-hr HL to confirm remains therapeutic. -Daily CBC/HL, monitor signs of bleeding  Thank you for allowing pharmacy to be part of this patients care team.  Noah Delaineuth Jamiyla Ishee, RPh Clinical Pharmacist Pager: [phone] 05/11/2017 7:16 PM

## 2017-05-11 NOTE — Progress Notes (Signed)
Called about pt's high BS Added Lantus 20 units daily. Started patient on her 50 units of short-acting insulin before meals  Discussed with nursing.  Haydee SalterPhillip M Hobbs MD

## 2017-05-11 NOTE — Consult Note (Signed)
Cardiology Consultation:   Patient ID: Teresa Hart; [id number]; [birth year]   Admit date: 05/11/2017 Date of Consult: 05/11/2017  Primary Care Provider: Phyllis Hart Primary Cardiologist: Diona Browner   Patient Profile:   Teresa Hart is a 66 y.o. female with a hx of CAD, CKD stage 3, asthma who is being seen today for the evaluation of chest pain at the request of Dr. Melynda Ripple.  History of Present Illness:   [name] had cath in 3/17 showing severe RCA stenosis and severe stenosis in a small OM2.  She had DES to RCA.  She had recurrent chest pain, relook cath in 4/17 continued to show severe OM2 disease with patent RCA stent.  Medical management because OM2 small with diffuse disease.  She says that she has had some exertional dyspnea over the last 6-8 months.  Dr. Diona Browner has had her on Lasix 20 mg daily.  Baseline creatinine appears to be around 1.5.  She also carries diagnosis of asthma and uses albuterol prn.  She never smoked.   She was in her usual state of health until last night.  She was watching a ballgame on TV and noted significant shortness of breath.  This persisted and then she developed central chest tightness.  The tightness was severe but did not last long => she took NTG with complete resolution.  Dyspnea persisted.  She went to the ER at [facility] where she was noted to be hypoxemic and was put on nasal cannula.  CXR was read as showing multilobar PNA.  She was given levofloxacin and IV steroids and transferred to [facility].  Repeat CXR here was not interpreted as showing PNA, appears fairly stable compared to prior.  She is afebrile, WBCs minimally elevated. She is still mildly short of breath at rest but has had no further chest pain.  Troponin has increased from initial 0.6 to 4.62.  ECG shows lateral/anterolateral changes.  She is not wheezing currently.  Cannot lie flat.   PMH: 1. CAD: LHC (3/17) with 90% diffuse OM2 stenosis, 60% mLAD, 95% proximal and mid  RCA stenosis.  Patient had DES to RCA, OM2 disease managed medically.  - Relook LHC 4/17 with chest pain: 99% diffuse OM2, patent RCA stent, 60% mLAD.  Medical management (OM2 small, diffusely diseased).  2. Chronic diastolic CHF: Echo (3/17) with EF 65-70%, moderate LVH, normal RV size and systolic function, mild aortic stenosis.  3. Aortic stenosis: Mild on last echo.  4. Asthma 5. CKD: Stage 3.  6. Type 2 diabetes 7. Hypothyroidism 8. GERD 9. H/o carotid stenosis. 10. Hyperlipidemia  Inpatient Medications: Scheduled Meds: . aspirin  324 mg Oral Once  . [START ON 05/12/2017] aspirin  81 mg Oral Daily  . atorvastatin  80 mg Oral q1800  . carvedilol  12.5 mg Oral BID WC  . clopidogrel  75 mg Oral Daily  . furosemide  60 mg Intravenous Once  . guaiFENesin  600 mg Oral BID  . insulin aspart  0-20 Units Subcutaneous TID WC  . ipratropium-albuterol  3 mL Nebulization Q6H  . predniSONE  40 mg Oral Q breakfast   Continuous Infusions: . heparin 1,050 Units/hr (05/11/17 1224)  . meropenem (MERREM) IV 1 g (05/11/17 1249)  . meropenem (MERREM) IV    . [START ON 05/12/2017] vancomycin    . vancomycin     PRN Meds: acetaminophen, albuterol, hydrALAZINE, morphine injection, nitroGLYCERIN, ondansetron (ZOFRAN) IV  Allergies:    Allergies  Allergen Reactions  .  Brilinta [Ticagrelor] Anaphylaxis  . Cinnamon Anaphylaxis and Other (See Comments)    Also, blisters in mouth  . Fish-Derived Products Anaphylaxis and Hives  . Other Anaphylaxis and Hives    NO TREE NUTS (Pecans and Walnuts especially)  . Penicillins Anaphylaxis, Hives and Swelling    Has patient had a PCN reaction causing immediate rash, facial/tongue/throat swelling, SOB or lightheadedness with hypotension: Yes Has patient had a PCN reaction causing severe rash involving mucus membranes or skin necrosis: No Has patient had a PCN reaction that required hospitalization: No Has patient had a PCN reaction occurring within the last  10 years: No If all of the above answers are "NO", then may proceed with Cephalosporin use.   . Strawberry Extract Anaphylaxis and Hives  . Amoxicillin-Pot Clavulanate Nausea And Vomiting  . Crestor [Rosuvastatin]     Muscle spasms and cramps  . Levaquin [Levofloxacin] Other (See Comments)    Per the patient's notes...Marland Kitchen"MD also had concerns regarding levaquin given cardiac history.."    Social History:   Social History   Social History  . Marital status: Married    Spouse name: N/A  . Number of children: 0  . Years of education: N/A   Occupational History  . Disabled    Social History Main Topics  . Smoking status: Never Smoker  . Smokeless tobacco: Never Used  . Alcohol use No  . Drug use: No  . Sexual activity: Yes   Other Topics Concern  . Not on file   Social History Narrative   Lives with husband, disabled from multiple medical issues.    Family History:   The patient's family history includes Coronary artery disease in her brother and brother; Heart disease in her brother.   ROS  Negative except as per HPI.   Physical Exam/Data:   Vitals:   05/11/17 0900  BP: (!) 125/55  Pulse: 91  Resp: 16  Temp: 98.4 F (36.9 C)  TempSrc: Oral  SpO2: 98%  Weight: 218 lb (98.9 kg)  Height: 5\' 5"  (1.651 m)   No intake or output data in the 24 hours ending 05/11/17 1314 Filed Weights   05/11/17 0900  Weight: 218 lb (98.9 kg)   Body mass index is 36.28 kg/m.  General:  Obese, NAD HEENT: normal Lymph: no adenopathy Neck: Thick, JVP appears elevated around 10 cm Endocrine:  No thryomegaly Vascular: No carotid bruits; FA pulses 2+ bilaterally without bruits  Cardiac:  normal S1, S2; RRR; 2/6 SEM RUSB with clear S2.  No edema.  Lungs:  Somewhat diminished breath sounds but clear.   Abd: soft, nontender, no hepatomegaly  Musculoskeletal:  No deformities, BUE and BLE strength normal and equal Skin: warm and dry  Neuro:  CNs 2-12 intact, no focal abnormalities  noted Psych:  Normal affect   EKG:  The EKG was personally reviewed and demonstrates NSR with anterolateral/lateral ST depression and TWIs.  This is different than prior ECG.   Telemetry:  Telemetry was personally reviewed and demonstrates:  NSR  Laboratory Data:  Chemistry Recent Labs Lab 05/11/17 1015  NA 134*  K 4.4  CL 101  CO2 22  GLUCOSE 335*  BUN 20  CREATININE 1.74*  CALCIUM 9.3  GFRNONAA 30*  GFRAA 35*  ANIONGAP 11     Recent Labs Lab 05/11/17 1015  PROT 7.1  ALBUMIN 3.7  AST 52*  ALT 23  ALKPHOS 92  BILITOT 0.4   Hematology Recent Labs Lab 05/11/17 1015  WBC 11.9*  RBC 3.96  HGB 11.8*  HCT 37.2  MCV 93.9  MCH 29.8  MCHC 31.7  RDW 14.4  PLT 207   Cardiac Enzymes Recent Labs Lab 05/11/17 1015  TROPONINI 4.62*   No results for input(s): TROPIPOC in the last 168 hours.  BNPNo results for input(s): BNP, PROBNP in the last 168 hours.  DDimer No results for input(s): DDIMER in the last 168 hours.  Radiology/Studies:  Dg Chest Port 1 View  Result Date: 05/11/2017 CLINICAL DATA:  Pneumonia.  Dry cough. EXAM: PORTABLE CHEST 1 VIEW COMPARISON:  02/02/2016 FINDINGS: Patient slightly rotated to the right. Lungs are adequately inflated without focal consolidation or effusion. Subtle linear atelectasis/scarring left midlung. Stable moderate cardiomegaly. Calcified plaque over the aortic arch. Remainder of the exam is unchanged. IMPRESSION: No acute cardiopulmonary disease. Stable moderate cardiomegaly. Aortic atherosclerosis. Electronically Signed   By: Elberta Fortisaniel  Boyle M.D.   On: 05/11/2017 11:24    Assessment and Plan:   66 yo with history of CAD s/p PCI 3/17, chronic diastolic CHF, CKD has presentation concerning for NSTEMI with possible acute diastolic CHF.  1.  CAD: Suspect NSTEMI.  TnI up to 4.62, pain relieved with NTG, significant ECG changes.  Based on ECG changes, it is possible that she may have occluded her diseased OM2.  - She will need  coronary angiography.  I will arrange for am as long as she can lie reasonably flat.  - Continue ASA, Plavix (has been on at home), atorvastatin 80 mg daily.  Of note, she is allergic to Brilinta.  - Heparin gtt.  2. Acute/chronic systolic CHF: I think that her dyspnea may be more related to acute diastolic CHF than PNA.  CXR here did not show PNA and she is afebrile with minimally elevated WBCs.  - Will give Lasix 60 mg IV x 1 now and follow response.  Would hold off on Lasix tomorrow if possible given baseline CKD and need for cath.   - Would do RHC with angiography tomorrow as her volume status is very difficult to discern.  - Repeat echo.  - Check BNP.  3. PNA: I am not convinced she has PNA (see above discussion).  Will get CXR PA/lateral.  Has only had portable CXR here.  - Abx per primary service.  4. Asthma: She has a history of asthma.  She was started on prednisone.  She is not wheezing, as above her dyspnea may be more related to volume.  Would keep steroid course short.  5. CKD: Stage 3.  Creatinine 1.7 today, baseline around 1.5.  - IV Lasix today, would hold off on Lasix tomorrow pre-cath.  Would also avoid aggressive pre-cath hydration as I think she is volume overloaded.   Marca AnconaDalton Kirke Breach 05/11/2017 1:33 PM

## 2017-05-12 ENCOUNTER — Other Ambulatory Visit (HOSPITAL_COMMUNITY): Payer: Self-pay

## 2017-05-12 ENCOUNTER — Encounter (HOSPITAL_COMMUNITY)
Admission: AD | Disposition: A | Discharge status: Home / Self Care | Payer: Self-pay | Source: Other Acute Inpatient Hospital | Attending: Family Medicine

## 2017-05-12 DIAGNOSIS — I10 Essential (primary) hypertension: Secondary | ICD-10-CM

## 2017-05-12 DIAGNOSIS — I2510 Atherosclerotic heart disease of native coronary artery without angina pectoris: Secondary | ICD-10-CM

## 2017-05-12 DIAGNOSIS — J9691 Respiratory failure, unspecified with hypoxia: Secondary | ICD-10-CM

## 2017-05-12 DIAGNOSIS — E1122 Type 2 diabetes mellitus with diabetic chronic kidney disease: Secondary | ICD-10-CM

## 2017-05-12 HISTORY — PX: RIGHT/LEFT HEART CATH AND CORONARY ANGIOGRAPHY: CATH118266

## 2017-05-12 LAB — POCT I-STAT 3, ART BLOOD GAS (G3+)
Acid-base deficit: 1 mmol/L (ref 0.0–2.0)
Bicarbonate: 26.1 mmol/L (ref 20.0–28.0)
O2 Saturation: 98 %
TCO2: 28 mmol/L (ref 0–100)
pCO2 arterial: 50.7 mmHg — ABNORMAL HIGH (ref 32.0–48.0)
pH, Arterial: 7.32 — ABNORMAL LOW (ref 7.350–7.450)
pO2, Arterial: 110 mmHg — ABNORMAL HIGH (ref 83.0–108.0)

## 2017-05-12 LAB — CBC
HEMATOCRIT: 34.7 % — AB (ref 36.0–46.0)
Hemoglobin: 10.9 g/dL — ABNORMAL LOW (ref 12.0–15.0)
MCH: 29.9 pg (ref 26.0–34.0)
MCHC: 31.4 g/dL (ref 30.0–36.0)
MCV: 95.3 fL (ref 78.0–100.0)
PLATELETS: 206 10*3/uL (ref 150–400)
RBC: 3.64 MIL/uL — AB (ref 3.87–5.11)
RDW: 14.6 % (ref 11.5–15.5)
WBC: 19.6 10*3/uL — AB (ref 4.0–10.5)

## 2017-05-12 LAB — BASIC METABOLIC PANEL
Anion gap: 12 (ref 5–15)
BUN: 36 mg/dL — AB (ref 6–20)
CHLORIDE: 101 mmol/L (ref 101–111)
CO2: 22 mmol/L (ref 22–32)
Calcium: 9.1 mg/dL (ref 8.9–10.3)
Creatinine, Ser: 1.86 mg/dL — ABNORMAL HIGH (ref 0.44–1.00)
GFR calc Af Amer: 32 mL/min — ABNORMAL LOW (ref >60)
GFR, EST NON AFRICAN AMERICAN: 28 mL/min — AB (ref >60)
GLUCOSE: 286 mg/dL — AB (ref 65–99)
POTASSIUM: 5.1 mmol/L (ref 3.5–5.1)
Sodium: 135 mmol/L (ref 135–145)

## 2017-05-12 LAB — T4, FREE: FREE T4: 1.03 ng/dL (ref 0.61–1.12)

## 2017-05-12 LAB — GLUCOSE, CAPILLARY
GLUCOSE-CAPILLARY: 341 mg/dL — AB (ref 65–99)
GLUCOSE-CAPILLARY: 333 mg/dL — AB (ref 65–99)
GLUCOSE-CAPILLARY: 264 mg/dL — AB (ref 65–99)
GLUCOSE-CAPILLARY: 283 mg/dL — AB (ref 65–99)
GLUCOSE-CAPILLARY: 274 mg/dL — AB (ref 65–99)
Glucose-Capillary: 264 mg/dL — ABNORMAL HIGH (ref 65–99)
Glucose-Capillary: 371 mg/dL — ABNORMAL HIGH (ref 65–99)
Glucose-Capillary: 273 mg/dL — ABNORMAL HIGH (ref 65–99)

## 2017-05-12 LAB — PROTIME-INR
INR: 1.03
Prothrombin Time: 13.5 seconds (ref 11.4–15.2)

## 2017-05-12 LAB — HEPARIN LEVEL (UNFRACTIONATED)
Heparin Unfractionated: 0.37 IU/mL (ref 0.30–0.70)
Heparin Unfractionated: 0.39 IU/mL (ref 0.30–0.70)

## 2017-05-12 LAB — HIV ANTIBODY (ROUTINE TESTING W REFLEX): HIV Screen 4th Generation wRfx: NONREACTIVE

## 2017-05-12 SURGERY — RIGHT/LEFT HEART CATH AND CORONARY ANGIOGRAPHY
Anesthesia: LOCAL

## 2017-05-12 MED ORDER — IOPAMIDOL (ISOVUE-370) INJECTION 76%
INTRAVENOUS | Status: DC | PRN
  Administered 2017-05-12: 25 mL via INTRA_ARTERIAL

## 2017-05-12 MED ORDER — SODIUM CHLORIDE 0.9 % IV SOLN
INTRAVENOUS | Status: DC
  Administered 2017-05-12: 18:00:00 via INTRAVENOUS

## 2017-05-12 MED ORDER — INSULIN ASPART 100 UNIT/ML ~~LOC~~ SOLN
0.0000 [IU] | Freq: Every day | SUBCUTANEOUS | Status: DC
  Administered 2017-05-12: 3 [IU] via SUBCUTANEOUS

## 2017-05-12 MED ORDER — FENTANYL CITRATE (PF) 100 MCG/2ML IJ SOLN
INTRAMUSCULAR | Status: DC | PRN
Start: 2017-05-12 — End: 2017-05-12
  Administered 2017-05-12: 25 ug via INTRAVENOUS

## 2017-05-12 MED ORDER — HEPARIN (PORCINE) IN NACL 2-0.9 UNIT/ML-% IJ SOLN
INTRAMUSCULAR | Status: AC | PRN
  Administered 2017-05-12: 1000 mL

## 2017-05-12 MED ORDER — INSULIN ASPART 100 UNIT/ML ~~LOC~~ SOLN: 0.0000 [IU] | Freq: Three times a day (TID) | SUBCUTANEOUS | Status: DC

## 2017-05-12 MED ORDER — INSULIN GLARGINE 100 UNIT/ML ~~LOC~~ SOLN
40.0000 [IU] | Freq: Every day | SUBCUTANEOUS | Status: DC
  Administered 2017-05-12: 40 [IU] via SUBCUTANEOUS
  Filled 2017-05-12: qty 0.4

## 2017-05-12 MED ORDER — LIDOCAINE HCL (PF) 1 % IJ SOLN
INTRAMUSCULAR | Status: DC | PRN
  Administered 2017-05-12 (×2): 2 mL via SUBCUTANEOUS

## 2017-05-12 MED ORDER — INSULIN REGULAR HUMAN 100 UNIT/ML IJ SOLN
INTRAMUSCULAR | Status: DC
  Administered 2017-05-12: 14:00:00 via INTRAVENOUS
  Filled 2017-05-12: qty 1

## 2017-05-12 MED ORDER — SODIUM CHLORIDE 0.9% FLUSH: 3.0000 mL | INTRAVENOUS | Status: DC | PRN

## 2017-05-12 MED ORDER — INSULIN ASPART 100 UNIT/ML ~~LOC~~ SOLN
0.0000 [IU] | Freq: Three times a day (TID) | SUBCUTANEOUS | Status: DC
  Administered 2017-05-13: 4 [IU] via SUBCUTANEOUS

## 2017-05-12 MED ORDER — MIDAZOLAM HCL 2 MG/2ML IJ SOLN
INTRAMUSCULAR | Status: DC | PRN
  Administered 2017-05-12: 1 mg via INTRAVENOUS

## 2017-05-12 MED ORDER — HEPARIN SODIUM (PORCINE) 1000 UNIT/ML IJ SOLN
INTRAMUSCULAR | Status: DC | PRN
  Administered 2017-05-12: 5000 [IU] via INTRAVENOUS

## 2017-05-12 MED ORDER — DEXTROSE 50 % IV SOLN: 25.0000 mL | INTRAVENOUS | Status: DC | PRN

## 2017-05-12 MED ORDER — FENTANYL CITRATE (PF) 100 MCG/2ML IJ SOLN
INTRAMUSCULAR | Status: AC
  Filled 2017-05-12: qty 2

## 2017-05-12 MED ORDER — ACETAMINOPHEN 325 MG PO TABS: 650.0000 mg | ORAL_TABLET | ORAL | Status: DC | PRN

## 2017-05-12 MED ORDER — LIDOCAINE HCL (PF) 1 % IJ SOLN
INTRAMUSCULAR | Status: AC
  Filled 2017-05-12: qty 30

## 2017-05-12 MED ORDER — SODIUM CHLORIDE 0.9 % IV SOLN: 250.0000 mL | INTRAVENOUS | Status: DC | PRN

## 2017-05-12 MED ORDER — ONDANSETRON HCL 4 MG/2ML IJ SOLN: 4.0000 mg | Freq: Four times a day (QID) | INTRAMUSCULAR | Status: DC | PRN

## 2017-05-12 MED ORDER — HEPARIN (PORCINE) IN NACL 2-0.9 UNIT/ML-% IJ SOLN
INTRAMUSCULAR | Status: AC
  Filled 2017-05-12: qty 1000

## 2017-05-12 MED ORDER — IOPAMIDOL (ISOVUE-370) INJECTION 76%
INTRAVENOUS | Status: AC
  Filled 2017-05-12: qty 100

## 2017-05-12 MED ORDER — ASPIRIN 81 MG PO CHEW
81.0000 mg | CHEWABLE_TABLET | Freq: Every day | ORAL | Status: DC
  Administered 2017-05-13: 81 mg via ORAL
  Filled 2017-05-12: qty 1

## 2017-05-12 MED ORDER — VERAPAMIL HCL 2.5 MG/ML IV SOLN
INTRAVENOUS | Status: DC | PRN
  Administered 2017-05-12: 10 mL via INTRA_ARTERIAL

## 2017-05-12 MED ORDER — INSULIN ASPART 100 UNIT/ML ~~LOC~~ SOLN
30.0000 [IU] | Freq: Three times a day (TID) | SUBCUTANEOUS | Status: DC
Start: 2017-05-12 — End: 2017-05-13
  Administered 2017-05-12 – 2017-05-13 (×3): 30 [IU] via SUBCUTANEOUS

## 2017-05-12 MED ORDER — IPRATROPIUM-ALBUTEROL 0.5-2.5 (3) MG/3ML IN SOLN
3.0000 mL | Freq: Three times a day (TID) | RESPIRATORY_TRACT | Status: DC
  Administered 2017-05-12: 3 mL via RESPIRATORY_TRACT
  Filled 2017-05-12: qty 3

## 2017-05-12 MED ORDER — MIDAZOLAM HCL 2 MG/2ML IJ SOLN
INTRAMUSCULAR | Status: AC
  Filled 2017-05-12: qty 2

## 2017-05-12 MED ORDER — SODIUM CHLORIDE 0.9% FLUSH
3.0000 mL | Freq: Two times a day (BID) | INTRAVENOUS | Status: DC
  Administered 2017-05-12 – 2017-05-13 (×2): 3 mL via INTRAVENOUS

## 2017-05-12 MED ORDER — HEPARIN SODIUM (PORCINE) 1000 UNIT/ML IJ SOLN
INTRAMUSCULAR | Status: AC
  Filled 2017-05-12: qty 1

## 2017-05-12 MED ORDER — VERAPAMIL HCL 2.5 MG/ML IV SOLN
INTRAVENOUS | Status: AC
  Filled 2017-05-12: qty 2

## 2017-05-12 SURGICAL SUPPLY — 13 items
CATH 5FR JL3.5 JR4 ANG PIG MP (CATHETERS) ×2 IMPLANT
CATH BALLN WEDGE 5F 110CM (CATHETERS) ×2 IMPLANT
DEVICE RAD COMP TR BAND LRG (VASCULAR PRODUCTS) ×2 IMPLANT
GLIDESHEATH SLEND SS 6F .021 (SHEATH) ×2 IMPLANT
GUIDEWIRE .025 260CM (WIRE) ×2 IMPLANT
GUIDEWIRE INQWIRE 1.5J.035X260 (WIRE) ×1 IMPLANT
HOVERMATT SINGLE USE (MISCELLANEOUS) ×2 IMPLANT
INQWIRE 1.5J .035X260CM (WIRE) ×2
KIT HEART LEFT (KITS) ×2 IMPLANT
PACK CARDIAC CATHETERIZATION (CUSTOM PROCEDURE TRAY) ×2 IMPLANT
SHEATH GLIDE SLENDER 4/5FR (SHEATH) ×2 IMPLANT
TRANSDUCER W/STOPCOCK (MISCELLANEOUS) ×2 IMPLANT
TUBING CIL FLEX 10 FLL-RA (TUBING) ×2 IMPLANT

## 2017-05-12 NOTE — H&P (View-Only) (Signed)
Progress Note  Patient Name: Teresa Hart Date of Encounter: 05/12/2017  Primary Cardiologist: Diona BrownerMcDowell   Subjective   Breathing has improved this morning. Able to lay flat in the bed. No further chest pressure.   Inpatient Medications    Scheduled Meds: . ARIPiprazole  10 mg Oral QPM  . aspirin  81 mg Oral Daily  . atorvastatin  80 mg Oral q1800  . carvedilol  25 mg Oral BID WC  . clopidogrel  75 mg Oral Daily  . guaiFENesin  600 mg Oral BID  . hydrALAZINE  50 mg Oral TID  . insulin aspart  0-15 Units Subcutaneous TID WC  . insulin glargine  20 Units Subcutaneous Daily  . insulin regular human CONCENTRATED  50 Units Subcutaneous TID WC  . pantoprazole  80 mg Oral Daily  . sodium chloride flush  3 mL Intravenous Q12H  . vortioxetine HBr  20 mg Oral Daily   Continuous Infusions: . sodium chloride    . sodium chloride    . heparin 1,050 Units/hr (05/12/17 0648)  . meropenem (MERREM) IV Stopped (05/12/17 1015)  . vancomycin 1,250 mg (05/12/17 1129)   PRN Meds: sodium chloride, acetaminophen, albuterol, diazepam, hydrALAZINE, morphine injection, nitroGLYCERIN, ondansetron (ZOFRAN) IV, sodium chloride flush   Vital Signs    Vitals:   05/12/17 0517 05/12/17 0718 05/12/17 0918 05/12/17 0948  BP: (!) 101/59  (!) 119/40 (!) 125/53  Pulse: 79  78   Resp: 18  18   Temp: 98.4 F (36.9 C)  98.5 F (36.9 C)   TempSrc:   Oral   SpO2: 99% 98% 96%   Weight:      Height:        Intake/Output Summary (Last 24 hours) at 05/12/17 1141 Last data filed at 05/12/17 1000  Gross per 24 hour  Intake           1349.8 ml  Output                0 ml  Net           1349.8 ml   Filed Weights   05/11/17 0900 05/11/17 2017  Weight: 218 lb (98.9 kg) 225 lb (102.1 kg)    Telemetry    SR - Personally Reviewed  ECG    SR with anterolateral ST depression and TWIs - Personally Reviewed  Physical Exam   General:Obese older W female appearing in no acute distress. Head:  Normocephalic, atraumatic.  Neck: Supple without bruits, JVD. Lungs:  Resp regular and unlabored, Diminished bilaterally in lower lobes. Heart: RRR, S1, S2, no S3, S4, or murmur; no rub. Abdomen: Soft, non-tender, non-distended with normoactive bowel sounds. No hepatomegaly. No rebound/guarding. No obvious abdominal masses. Extremities: No clubbing, cyanosis, edema. Distal pedal pulses are 2+ bilaterally. Neuro: Alert and oriented X 3. Moves all extremities spontaneously. Psych: Normal affect.  Labs    Chemistry Recent Labs Lab 05/11/17 1015 05/12/17 0501  NA 134* 135  K 4.4 5.1  CL 101 101  CO2 22 22  GLUCOSE 335* 286*  BUN 20 36*  CREATININE 1.74* 1.86*  CALCIUM 9.3 9.1  PROT 7.1  --   ALBUMIN 3.7  --   AST 52*  --   ALT 23  --   ALKPHOS 92  --   BILITOT 0.4  --   GFRNONAA 30* 28*  GFRAA 35* 32*  ANIONGAP 11 12     Hematology Recent Labs Lab 05/11/17 1015 05/12/17 0501  WBC  11.9* 19.6*  RBC 3.96 3.64*  HGB 11.8* 10.9*  HCT 37.2 34.7*  MCV 93.9 95.3  MCH 29.8 29.9  MCHC 31.7 31.4  RDW 14.4 14.6  PLT 207 206    Cardiac Enzymes Recent Labs Lab 05/11/17 1015 05/11/17 1435 05/11/17 2146  TROPONINI 4.62* 8.87* 11.71*   No results for input(s): TROPIPOC in the last 168 hours.   BNP Recent Labs Lab 05/11/17 1435  BNP 478.5*     DDimer No results for input(s): DDIMER in the last 168 hours.    Radiology    Dg Chest 2 View  Result Date: 05/11/2017 CLINICAL DATA:  Shortness of breath, pneumonia. Preprocedural evaluation for cardiac catheterization. History of hypertension, hyperlipidemia, diabetes, respiratory failure. EXAM: CHEST  2 VIEW COMPARISON:  Chest radiograph May 11, 2017 1115 hours FINDINGS: Stable cardiomegaly. Mediastinal silhouette is nonsuspicious, mildly calcified aortic knob. No pleural effusion or focal consolidation. No pneumothorax. Soft tissue planes and included osseous structures are nonsuspicious. IMPRESSION: Stable cardiomegaly,  no acute pulmonary process. Electronically Signed   By: Awilda Metro M.D.   On: 05/11/2017 22:04   Dg Chest Port 1 View  Result Date: 05/11/2017 CLINICAL DATA:  Pneumonia.  Dry cough. EXAM: PORTABLE CHEST 1 VIEW COMPARISON:  02/02/2016 FINDINGS: Patient slightly rotated to the right. Lungs are adequately inflated without focal consolidation or effusion. Subtle linear atelectasis/scarring left midlung. Stable moderate cardiomegaly. Calcified plaque over the aortic arch. Remainder of the exam is unchanged. IMPRESSION: No acute cardiopulmonary disease. Stable moderate cardiomegaly. Aortic atherosclerosis. Electronically Signed   By: Elberta Fortis M.D.   On: 05/11/2017 11:24    Cardiac Studies   N/a   Patient Profile     66 y.o. female with PMH of hx of CAD, CKD stage 3, chronic diastolic HF, and asthma who presented with dyspnea and chest tightness.    Assessment & Plan    1. NSTEMI: Trop peaked at 11.71. No further chest pain. Last cath in 3/17 noted OM2 disease. EKG noted ST depression and TWI in anterolateral leads.  -- on IV heparin.  -- continue ASA, plavix, statin (allergic to Brilinta) -- planned for [facility] today  2. Acute on Chronic combined HF: Presented with dyspnea. BNP 478. Given IV lasix 60mg  x1. No documented UOP, but reports good UOP. States her breathing has improved today. Able to lay flat in the bed.  -- hold IV lasix today given plan for [facility], will likely need further diuresis post cath -- echo pending  3. PNA: Treated with antibiotics on admission. CXR yesterday with no acute findings. WBC 19.6.  -- management per primary  4. CKD: Cr 1.7>>1.86 today. Baseline appears around 1.5. Will need to monitor closely with plans for cardiac cath.   Signed, Laverda Page, NP  05/12/2017, 11:41 AM    Patient seen, examined. Available data reviewed. Agree with findings, assessment, and plan as outlined by Laverda Page, NP-C. My exam today: Vitals:   05/12/17 0918  05/12/17 0948  BP: (!) 119/40 (!) 125/53  Pulse: 78   Resp: 18   Temp: 98.5 F (36.9 C)    Pt is A pleasant, morbidly obese woman, NAD HEENT: normal Neck: JVP - unable to visualize because of body habitus Lungs: Rhonchi bilaterally CV: RRR without murmur or gallop Abd: soft, NT, Positive BS, no hepatomegaly, obese Ext: no C/C/E, distal pulses intact and equal Skin: warm/dry no rash  Patient for right/left heart catheterization today. Previous cath note reviewed. Patient now with acute coronary syndrome/non-STEMI with markedly  elevated troponin. Anticipate new culprit lesion based on her clinical episode with elevated troponin. Her presentation is complicated by acute heart failure and chronic kidney disease. Her breathing is marginally better after receiving IV Lasix. I think it is best to move forward with catheterization today and assess her volume status better with invasive hemodynamics.  Tonny Bollman, M.D. 05/12/2017 12:11 PM

## 2017-05-12 NOTE — Progress Notes (Signed)
ANTICOAGULATION CONSULT NOTE - Follow Up Consult  Pharmacy Consult for Heparin  Indication: chest pain/ACS  Patient Measurements: Height: 5\' 5"  (165.1 cm) Weight: 225 lb (102.1 kg) IBW/kg (Calculated) : 57  Vital Signs: Temp: 98.4 F (36.9 C) (06/24 2009) Temp Source: Oral (06/24 1843) BP: 126/48 (06/24 2009) Pulse Rate: 94 (06/24 2009)  Labs:  Recent Labs  05/11/17 1015 05/11/17 1435 05/11/17 1822 05/11/17 2146 05/11/17 2335  HGB 11.8*  --   --   --   --   HCT 37.2  --   --   --   --   PLT 207  --   --   --   --   HEPARINUNFRC  --   --  0.43  --  0.39  CREATININE 1.74*  --   --   --   --   TROPONINI 4.62* 8.87*  --  11.71*  --     Estimated Creatinine Clearance: 38.7 mL/min (A) (by C-G formula based on SCr of 1.74 mg/dL (H)).   Assessment: Heparin for elevated troponin, likely cath today if pt can tolerate, heparin level therapeutic x 2  Goal of Therapy:  Heparin level 0.3-0.7 units/ml Monitor platelets by anticoagulation protocol: Yes   Plan:  -Cont heparin 1050 units/hr -Daily CBC/HL  Abran DukeLedford, Duy Lemming 05/12/2017,12:30 AM

## 2017-05-12 NOTE — Progress Notes (Signed)
Spoke with Dr. Laural BenesJohnson concerning Insulin drip, he wants to d/c drip now and give 30units Novolog for meal coverage.  I asked if patient needed dose of Lantus prior to discontinuing drip and MD stated no.  Primary RN Herbert SetaHeather updated.  Colman Caterarpley, Rylann Munford Danielle

## 2017-05-12 NOTE — Progress Notes (Signed)
PROGRESS NOTE    Teresa Hart  ZOX:[id number]  DOB: 14-Mar-1951  DOA: 05/11/2017 PCP: Abran Richard, PA-C   Brief Admission Hx: Teresa Hart is a 66 y.o. female  with past medical history significant for coronary artery disease, CK D, hypothyroid and diabetes presented to outside hospital emergency room for evaluation of shortness of breath. Patient states that she's had a cough for a little over a week. She did not seek medical care. She felt it was allergies. Went to the emergency room last night due to sudden onset of shortness of breath and crushing chest pain. Patient did try to take a nitroglycerin at home which minimally helped her symptoms. Patient denies any sick contacts. Pt developed elevated troponins and diagnosed with NSTEMI.  MDM/Assessment & Plan:   Acute resp failure with hypoxia - improved now Doubt this is pneumonia given clear CXR Scheduled DuoNeb's When necessary albuterol  DC antibiotics  DC steroids Oxygen therapy Continuous pulse oximetry IS RT consult Flutter valve Hx of asthma  ACS - serial trop ordered, initial elevated - prn EKG CP - prn moprhine CP - prn ntg cp - asa - echo pending - tele bed, cardiac monitoring - zofran prn for nausea Currently without chest pain Cardiology consult to an aware of elevation in troponin-> plan for cath later today Heparin ordered  Diastolic CHF with mild exacerbation  Cont Coreg bid Lasix per cardio recs Echocardiogram pending  Hypertension When necessary hydralazine 10 mg IV as needed for severe blood pressure Cont PO hydralazine  CKD III Monitor Cr daily Cr at baseline,  1.6 1.87 on admit I&Os  Anxiety/Depression Cont vortioxetine, abilify No SI/HI Prn ativan  Insulin Resistant Type 2 Diabetes Mellitus, poorly controlled Likely exacerbated by steroids Low threshold to start insulin infusion given NSTEMI SSI ACHS Hold victoza  CAD Cont plavix, heparin infusion  Cath  later today  Hypothyroidism Low TSH noted, check Free t4/T3 Pt has a private endocrinologist to follow up with No signs of hyper or hypothyroidism  Hyperlipidemia Continue statin  GERD Cont PPI & zantc-> pepcid  Code Status: full  DVT Prophylaxis: scd Family Communication: husband sleep Disposition Plan: Pending Improvement  Consultants:  cardiology  Subjective: Pt denies chest pain, breathing much better, dry cough noted.    Objective: Vitals:   05/11/17 2017 05/11/17 2201 05/12/17 0517 05/12/17 0718  BP:   (!) 101/59   Pulse:   79   Resp:   18   Temp:   98.4 F (36.9 C)   TempSrc:      SpO2:  97% 99% 98%  Weight: 102.1 kg (225 lb)     Height:        Intake/Output Summary (Last 24 hours) at 05/12/17 8119 Last data filed at 05/12/17 0400  Gross per 24 hour  Intake           1109.8 ml  Output                0 ml  Net           1109.8 ml   Filed Weights   05/11/17 0900 05/11/17 2017  Weight: 98.9 kg (218 lb) 102.1 kg (225 lb)   REVIEW OF SYSTEMS  As per history otherwise all reviewed and reported negative  Exam:  General exam: awake, alert, NAD, cooperative Respiratory system:  No increased work of breathing. Cardiovascular system: S1 & S2 heard,. No JVD, murmurs, gallops, clicks or pedal edema. Gastrointestinal system: Abdomen is nondistended,  soft and nontender. Normal bowel sounds heard. Central nervous system: Alert and oriented. No focal neurological deficits. Extremities: no cyanosis.  Data Reviewed: Basic Metabolic Panel:  Recent Labs Lab 05/11/17 1015 05/12/17 0501  NA 134* 135  K 4.4 5.1  CL 101 101  CO2 22 22  GLUCOSE 335* 286*  BUN 20 36*  CREATININE 1.74* 1.86*  CALCIUM 9.3 9.1  MG 1.8  --   PHOS 3.3  --    Liver Function Tests:  Recent Labs Lab 05/11/17 1015  AST 52*  ALT 23  ALKPHOS 92  BILITOT 0.4  PROT 7.1  ALBUMIN 3.7   No results for input(s): LIPASE, AMYLASE in the last 168 hours. No results for  input(s): AMMONIA in the last 168 hours. CBC:  Recent Labs Lab 05/11/17 1015 05/12/17 0501  WBC 11.9* 19.6*  NEUTROABS 11.3*  --   HGB 11.8* 10.9*  HCT 37.2 34.7*  MCV 93.9 95.3  PLT 207 206   Cardiac Enzymes:  Recent Labs Lab 05/11/17 1015 05/11/17 1435 05/11/17 2146  TROPONINI 4.62* 8.87* 11.71*   CBG (last 3)   Recent Labs  05/11/17 1726 05/11/17 2159 05/12/17 0747  GLUCAP 421* 296* 264*   Recent Results (from the past 240 hour(s))  Surgical PCR screen     Status: None   Collection Time: 05/11/17  2:21 PM  Result Value Ref Range Status   MRSA, PCR NEGATIVE NEGATIVE Final   Staphylococcus aureus NEGATIVE NEGATIVE Final    Comment:        The Xpert SA Assay (FDA approved for NASAL specimens in patients over 7 years of age), is one component of a comprehensive surveillance program.  Test performance has been validated by [facility] for patients greater than or equal to 40 year old. It is not intended to diagnose infection nor to guide or monitor treatment.      Studies: Dg Chest 2 View  Result Date: 05/11/2017 CLINICAL DATA:  Shortness of breath, pneumonia. Preprocedural evaluation for cardiac catheterization. History of hypertension, hyperlipidemia, diabetes, respiratory failure. EXAM: CHEST  2 VIEW COMPARISON:  Chest radiograph May 11, 2017 1115 hours FINDINGS: Stable cardiomegaly. Mediastinal silhouette is nonsuspicious, mildly calcified aortic knob. No pleural effusion or focal consolidation. No pneumothorax. Soft tissue planes and included osseous structures are nonsuspicious. IMPRESSION: Stable cardiomegaly, no acute pulmonary process. Electronically Signed   By: Awilda Metro M.D.   On: 05/11/2017 22:04   Dg Chest Port 1 View  Result Date: 05/11/2017 CLINICAL DATA:  Pneumonia.  Dry cough. EXAM: PORTABLE CHEST 1 VIEW COMPARISON:  02/02/2016 FINDINGS: Patient slightly rotated to the right. Lungs are adequately inflated without focal  consolidation or effusion. Subtle linear atelectasis/scarring left midlung. Stable moderate cardiomegaly. Calcified plaque over the aortic arch. Remainder of the exam is unchanged. IMPRESSION: No acute cardiopulmonary disease. Stable moderate cardiomegaly. Aortic atherosclerosis. Electronically Signed   By: Elberta Fortis M.D.   On: 05/11/2017 11:24   Scheduled Meds: . ARIPiprazole  10 mg Oral QPM  . aspirin  81 mg Oral Daily  . atorvastatin  80 mg Oral q1800  . carvedilol  25 mg Oral BID WC  . clopidogrel  75 mg Oral Daily  . guaiFENesin  600 mg Oral BID  . hydrALAZINE  50 mg Oral TID  . insulin aspart  0-15 Units Subcutaneous TID WC  . insulin glargine  20 Units Subcutaneous Daily  . insulin regular human CONCENTRATED  50 Units Subcutaneous TID WC  . ipratropium-albuterol  3 mL  Nebulization TID  . pantoprazole  80 mg Oral Daily  . predniSONE  40 mg Oral Q breakfast  . sodium chloride flush  3 mL Intravenous Q12H  . vortioxetine HBr  20 mg Oral Daily   Continuous Infusions: . sodium chloride    . sodium chloride    . heparin 1,050 Units/hr (05/12/17 0648)  . meropenem (MERREM) IV 1 g (05/11/17 2131)  . vancomycin      Principal Problem:   Respiratory failure with hypoxia (HCC) Active Problems:   Hypertension   Diabetes mellitus with stage 3 chronic kidney disease (HCC)   CKD (chronic kidney disease), stage III   Pneumonia   Acute diastolic CHF (congestive heart failure) (HCC)  Time spent:   Standley Dakinslanford Lowella Kindley, MD, FAAFP [facility] Pager [phone]  If 7PM-7AM, please contact night-coverage [email or web address] Password [email or web address] 05/12/2017, 9:09 AM    LOS: 1 day

## 2017-05-12 NOTE — Progress Notes (Signed)
05/12/2017 6:46 PM  RN called as patient is wanting to eat now after cath, since she is on insulin infusion will go ahead and order diet, give prandial insulin and stop the drip, also ordered supplemental sliding scale coverage, CBGs, HS coverage, given a dose of lantus yesterday evening as well and will order for evening lantus dose.  Follow and adjust regimen as needed.   Teresa Hart

## 2017-05-12 NOTE — Progress Notes (Signed)
Progress Note  Patient Name: Teresa Hart Date of Encounter: 05/12/2017  Primary Cardiologist: Diona BrownerMcDowell   Subjective   Breathing has improved this morning. Able to lay flat in the bed. No further chest pressure.   Inpatient Medications    Scheduled Meds: . ARIPiprazole  10 mg Oral QPM  . aspirin  81 mg Oral Daily  . atorvastatin  80 mg Oral q1800  . carvedilol  25 mg Oral BID WC  . clopidogrel  75 mg Oral Daily  . guaiFENesin  600 mg Oral BID  . hydrALAZINE  50 mg Oral TID  . insulin aspart  0-15 Units Subcutaneous TID WC  . insulin glargine  20 Units Subcutaneous Daily  . insulin regular human CONCENTRATED  50 Units Subcutaneous TID WC  . pantoprazole  80 mg Oral Daily  . sodium chloride flush  3 mL Intravenous Q12H  . vortioxetine HBr  20 mg Oral Daily   Continuous Infusions: . sodium chloride    . sodium chloride    . heparin 1,050 Units/hr (05/12/17 0648)  . meropenem (MERREM) IV Stopped (05/12/17 1015)  . vancomycin 1,250 mg (05/12/17 1129)   PRN Meds: sodium chloride, acetaminophen, albuterol, diazepam, hydrALAZINE, morphine injection, nitroGLYCERIN, ondansetron (ZOFRAN) IV, sodium chloride flush   Vital Signs    Vitals:   05/12/17 0517 05/12/17 0718 05/12/17 0918 05/12/17 0948  BP: (!) 101/59  (!) 119/40 (!) 125/53  Pulse: 79  78   Resp: 18  18   Temp: 98.4 F (36.9 C)  98.5 F (36.9 C)   TempSrc:   Oral   SpO2: 99% 98% 96%   Weight:      Height:        Intake/Output Summary (Last 24 hours) at 05/12/17 1141 Last data filed at 05/12/17 1000  Gross per 24 hour  Intake           1349.8 ml  Output                0 ml  Net           1349.8 ml   Filed Weights   05/11/17 0900 05/11/17 2017  Weight: 218 lb (98.9 kg) 225 lb (102.1 kg)    Telemetry    SR - Personally Reviewed  ECG    SR with anterolateral ST depression and TWIs - Personally Reviewed  Physical Exam   General:Obese older W female appearing in no acute distress. Head:  Normocephalic, atraumatic.  Neck: Supple without bruits, JVD. Lungs:  Resp regular and unlabored, Diminished bilaterally in lower lobes. Heart: RRR, S1, S2, no S3, S4, or murmur; no rub. Abdomen: Soft, non-tender, non-distended with normoactive bowel sounds. No hepatomegaly. No rebound/guarding. No obvious abdominal masses. Extremities: No clubbing, cyanosis, edema. Distal pedal pulses are 2+ bilaterally. Neuro: Alert and oriented X 3. Moves all extremities spontaneously. Psych: Normal affect.  Labs    Chemistry Recent Labs Lab 05/11/17 1015 05/12/17 0501  NA 134* 135  K 4.4 5.1  CL 101 101  CO2 22 22  GLUCOSE 335* 286*  BUN 20 36*  CREATININE 1.74* 1.86*  CALCIUM 9.3 9.1  PROT 7.1  --   ALBUMIN 3.7  --   AST 52*  --   ALT 23  --   ALKPHOS 92  --   BILITOT 0.4  --   GFRNONAA 30* 28*  GFRAA 35* 32*  ANIONGAP 11 12     Hematology Recent Labs Lab 05/11/17 1015 05/12/17 0501  WBC  11.9* 19.6*  RBC 3.96 3.64*  HGB 11.8* 10.9*  HCT 37.2 34.7*  MCV 93.9 95.3  MCH 29.8 29.9  MCHC 31.7 31.4  RDW 14.4 14.6  PLT 207 206    Cardiac Enzymes Recent Labs Lab 05/11/17 1015 05/11/17 1435 05/11/17 2146  TROPONINI 4.62* 8.87* 11.71*   No results for input(s): TROPIPOC in the last 168 hours.   BNP Recent Labs Lab 05/11/17 1435  BNP 478.5*     DDimer No results for input(s): DDIMER in the last 168 hours.    Radiology    Dg Chest 2 View  Result Date: 05/11/2017 CLINICAL DATA:  Shortness of breath, pneumonia. Preprocedural evaluation for cardiac catheterization. History of hypertension, hyperlipidemia, diabetes, respiratory failure. EXAM: CHEST  2 VIEW COMPARISON:  Chest radiograph May 11, 2017 1115 hours FINDINGS: Stable cardiomegaly. Mediastinal silhouette is nonsuspicious, mildly calcified aortic knob. No pleural effusion or focal consolidation. No pneumothorax. Soft tissue planes and included osseous structures are nonsuspicious. IMPRESSION: Stable cardiomegaly,  no acute pulmonary process. Electronically Signed   By: Awilda Metro M.D.   On: 05/11/2017 22:04   Dg Chest Port 1 View  Result Date: 05/11/2017 CLINICAL DATA:  Pneumonia.  Dry cough. EXAM: PORTABLE CHEST 1 VIEW COMPARISON:  02/02/2016 FINDINGS: Patient slightly rotated to the right. Lungs are adequately inflated without focal consolidation or effusion. Subtle linear atelectasis/scarring left midlung. Stable moderate cardiomegaly. Calcified plaque over the aortic arch. Remainder of the exam is unchanged. IMPRESSION: No acute cardiopulmonary disease. Stable moderate cardiomegaly. Aortic atherosclerosis. Electronically Signed   By: Elberta Fortis M.D.   On: 05/11/2017 11:24    Cardiac Studies   N/a   Patient Profile     66 y.o. female with PMH of hx of CAD, CKD stage 3, chronic diastolic HF, and asthma who presented with dyspnea and chest tightness.    Assessment & Plan    1. NSTEMI: Trop peaked at 11.71. No further chest pain. Last cath in 3/17 noted OM2 disease. EKG noted ST depression and TWI in anterolateral leads.  -- on IV heparin.  -- continue ASA, plavix, statin (allergic to Brilinta) -- planned for [facility] today  2. Acute on Chronic combined HF: Presented with dyspnea. BNP 478. Given IV lasix 60mg  x1. No documented UOP, but reports good UOP. States her breathing has improved today. Able to lay flat in the bed.  -- hold IV lasix today given plan for [facility], will likely need further diuresis post cath -- echo pending  3. PNA: Treated with antibiotics on admission. CXR yesterday with no acute findings. WBC 19.6.  -- management per primary  4. CKD: Cr 1.7>>1.86 today. Baseline appears around 1.5. Will need to monitor closely with plans for cardiac cath.   Signed, Laverda Page, NP  05/12/2017, 11:41 AM    Patient seen, examined. Available data reviewed. Agree with findings, assessment, and plan as outlined by Laverda Page, NP-C. My exam today: Vitals:   05/12/17 0918  05/12/17 0948  BP: (!) 119/40 (!) 125/53  Pulse: 78   Resp: 18   Temp: 98.5 F (36.9 C)    Pt is A pleasant, morbidly obese woman, NAD HEENT: normal Neck: JVP - unable to visualize because of body habitus Lungs: Rhonchi bilaterally CV: RRR without murmur or gallop Abd: soft, NT, Positive BS, no hepatomegaly, obese Ext: no C/C/E, distal pulses intact and equal Skin: warm/dry no rash  Patient for right/left heart catheterization today. Previous cath note reviewed. Patient now with acute coronary syndrome/non-STEMI with markedly  elevated troponin. Anticipate new culprit lesion based on her clinical episode with elevated troponin. Her presentation is complicated by acute heart failure and chronic kidney disease. Her breathing is marginally better after receiving IV Lasix. I think it is best to move forward with catheterization today and assess her volume status better with invasive hemodynamics.  Tonny Bollman, M.D. 05/12/2017 12:11 PM

## 2017-05-12 NOTE — Progress Notes (Signed)
ANTICOAGULATION CONSULT NOTE  Pharmacy Consult for heparin Indication: chest pain/ACS  Allergies  Allergen Reactions  . Brilinta [Ticagrelor] Anaphylaxis  . Cinnamon Anaphylaxis and Other (See Comments)    Also, blisters in mouth  . Fish-Derived Products Anaphylaxis and Hives  . Other Anaphylaxis and Hives    NO TREE NUTS (Pecans and Walnuts especially)  . Penicillins Anaphylaxis, Hives and Swelling    Has patient had a PCN reaction causing immediate rash, facial/tongue/throat swelling, SOB or lightheadedness with hypotension: Yes Has patient had a PCN reaction causing severe rash involving mucus membranes or skin necrosis: No Has patient had a PCN reaction that required hospitalization: No Has patient had a PCN reaction occurring within the last 10 years: No If all of the above answers are "NO", then may proceed with Cephalosporin use.   . Strawberry Extract Anaphylaxis and Hives  . Amoxicillin-Pot Clavulanate Nausea And Vomiting  . Crestor [Rosuvastatin]     Muscle spasms and cramps  . Levaquin [Levofloxacin] Other (See Comments)    Per the patient's notes...Marland Kitchen."MD also had concerns regarding levaquin given cardiac history.."    Patient Measurements: Height: 5\' 5"  (165.1 cm) Weight: 225 lb (102.1 kg) IBW/kg (Calculated) : 57 Heparin Dosing Weight: 79 kg  Vital Signs: Temp: 98.4 F (36.9 C) (06/25 0517) BP: 101/59 (06/25 0517) Pulse Rate: 79 (06/25 0517)  Labs:  Recent Labs  05/11/17 1015 05/11/17 1435 05/11/17 1822 05/11/17 2146 05/11/17 2335 05/12/17 0501  HGB 11.8*  --   --   --   --  10.9*  HCT 37.2  --   --   --   --  34.7*  PLT 207  --   --   --   --  206  HEPARINUNFRC  --   --  0.43  --  0.39 0.37  CREATININE 1.74*  --   --   --   --  1.86*  TROPONINI 4.62* 8.87*  --  11.71*  --   --     Estimated Creatinine Clearance: 36.2 mL/min (A) (by C-G formula based on SCr of 1.86 mg/dL (H)).  Assessment: [age] YOF with history of CAD transferred from OSH  following de-sats to 75%. Initial troponin was 4.62 and has trended up. Pharmacy consulted to manage IV heparin for ACS. Plan is for cath.  Heparin level is therapeutic at 0.37 on 1050 units/hr. No bleeding noted, Hgb stable at 10.9, platelets are normal.  Goal of Therapy:  Heparin level 0.3-0.7 units/ml Monitor platelets by anticoagulation protocol: Yes   Plan:  -Continue heparin drip at 1050 units/hr -Daily CBC and heparin level -Monitor for s/sx of bleeding -F/u after cath  Thank you for allowing pharmacy to be part of this patients care team.  Loura BackJennifer Martinsburg, PharmD, BCPS Clinical Pharmacist Phone for today [phone] Main pharmacy - [phone] 05/12/2017 7:56 AM

## 2017-05-12 NOTE — Interval H&P Note (Signed)
Cath Lab Visit (complete for each Cath Lab visit)  Clinical Evaluation Leading to the Procedure:   ACS: Yes.    Non-ACS:    Anginal Classification: CCS IV  Anti-ischemic medical therapy: Minimal Therapy (1 class of medications)  Non-Invasive Test Results: No non-invasive testing performed  Prior CABG: No previous CABG      History and Physical Interval Note:  05/12/2017 3:49 PM  Teresa Hart  has presented today for surgery, with the diagnosis of NSTEMI  The various methods of treatment have been discussed with the patient and family. After consideration of risks, benefits and other options for treatment, the patient has consented to  Procedure(s): Right/Left Heart Cath and Coronary Angiography (N/A) as a surgical intervention .  The patient's history has been reviewed, patient examined, no change in status, stable for surgery.  I have reviewed the patient's chart and labs.  Questions were answered to the patient's satisfaction.     Lance MussJayadeep Varanasi

## 2017-05-12 NOTE — Progress Notes (Signed)
05/12/2017 1:02 PM  BS remain uncontrolled, given NSTEMI and NPO status  I am starting patient on IV insulin infusion GlucoStabilizer to achieve glycemic control in setting of acute illness and will transfer to SDU.   Clanford Regions Financial CorporationJohnson

## 2017-05-12 NOTE — Progress Notes (Signed)
Pt placed on glucostabilizer per MD order, has new order for NS at 50cc/hr, aware cardiology following fluids and they had voiced concerns this am about fluids, lasix yesterday, pt being able breath laying down for cath today, etc. ... Notified cards NP Laverda PageLindsay Roberts that saw pt this am of medical MD placing on glucostabilizer, regular insulin gtt and NS at 50cc/hr.  Order received to hold the NS for now, and they will reassess IVF needs once cath done this afternoon/evening.  Pt informed of plan.

## 2017-05-13 ENCOUNTER — Inpatient Hospital Stay (HOSPITAL_COMMUNITY): Payer: Medicare Other

## 2017-05-13 ENCOUNTER — Encounter (HOSPITAL_COMMUNITY): Payer: Self-pay | Admitting: Interventional Cardiology

## 2017-05-13 DIAGNOSIS — R079 Chest pain, unspecified: Secondary | ICD-10-CM

## 2017-05-13 LAB — CBC
HCT: 35.7 % — ABNORMAL LOW (ref 36.0–46.0)
Hemoglobin: 11 g/dL — ABNORMAL LOW (ref 12.0–15.0)
MCH: 30 pg (ref 26.0–34.0)
MCHC: 30.8 g/dL (ref 30.0–36.0)
MCV: 97.3 fL (ref 78.0–100.0)
Platelets: 244 10*3/uL (ref 150–400)
RBC: 3.67 MIL/uL — ABNORMAL LOW (ref 3.87–5.11)
RDW: 15.1 % (ref 11.5–15.5)
WBC: 17.4 10*3/uL — AB (ref 4.0–10.5)

## 2017-05-13 LAB — ECHOCARDIOGRAM COMPLETE
AOPV: 0.67 m/s
AOVTI: 46.4 cm
AV Area VTI index: 0.74 cm2/m2
AV Mean grad: 10 mmHg
AV Peak grad: 17 mmHg
AV VEL mean LVOT/AV: 0.65
AV peak Index: 0.78
AV pk vel: 204 cm/s
AV vel: 1.63
AVA: 1.63 cm2
AVAREAMEANV: 1.65 cm2
AVAREAMEANVIN: 0.75 cm2/m2
AVAREAVTI: 1.71 cm2
CHL CUP MV DEC (S): 190
DOP CAL AO MEAN VELOCITY: 141 cm/s
E decel time: 190 msec
E/e' ratio: 28.64
FS: 31 % (ref 28–44)
Height: 65 in
IVS/LV PW RATIO, ED: 1.52
LA diam end sys: 36 mm
LA vol: 38 mL
LADIAMINDEX: 1.63 cm/m2
LASIZE: 36 mm
LAVOLA4C: 23.8 mL
LAVOLIN: 17.3 mL/m2
LV SIMPSON'S DISK: 57
LV TDI E'LATERAL: 4.4
LV TDI E'MEDIAL: 4.4
LVDIAVOL: 90 mL (ref 46–106)
LVDIAVOLIN: 41 mL/m2
LVEEAVG: 28.64
LVEEMED: 28.64
LVELAT: 4.4 cm/s
LVOT VTI: 29.8 cm
LVOT area: 2.54 cm2
LVOT diameter: 18 mm
LVOT peak grad rest: 8 mmHg
LVOT peak vel: 137 cm/s
LVOTSV: 76 mL
LVOTVTI: 0.64 cm
LVSYSVOL: 39 mL (ref 14–42)
LVSYSVOLIN: 18 mL/m2
Lateral S' vel: 11.3 cm/s
MV Peak grad: 6 mmHg
MV pk E vel: 126 m/s
MVPKAVEL: 110 m/s
PW: 8.81 mm — AB (ref 0.6–1.1)
Stroke v: 51 ml
TAPSE: 16.9 mm
VTI: 153 cm
Valve area index: 0.74
Weight: 3574.4 oz

## 2017-05-13 LAB — BASIC METABOLIC PANEL
ANION GAP: 8 (ref 5–15)
BUN: 46 mg/dL — ABNORMAL HIGH (ref 6–20)
CO2: 26 mmol/L (ref 22–32)
CREATININE: 2.1 mg/dL — AB (ref 0.44–1.00)
Calcium: 9.5 mg/dL (ref 8.9–10.3)
Chloride: 105 mmol/L (ref 101–111)
GFR, EST AFRICAN AMERICAN: 28 mL/min — AB (ref >60)
GFR, EST NON AFRICAN AMERICAN: 24 mL/min — AB (ref >60)
Glucose, Bld: 185 mg/dL — ABNORMAL HIGH (ref 65–99)
Potassium: 4.8 mmol/L (ref 3.5–5.1)
SODIUM: 139 mmol/L (ref 135–145)

## 2017-05-13 LAB — POCT I-STAT 3, VENOUS BLOOD GAS (G3P V)
BICARBONATE: 26.8 mmol/L (ref 20.0–28.0)
O2 Saturation: 64 %
PCO2 VEN: 53.2 mmHg (ref 44.0–60.0)
PH VEN: 7.31 (ref 7.250–7.430)
PO2 VEN: 37 mmHg (ref 32.0–45.0)
TCO2: 28 mmol/L (ref 0–100)

## 2017-05-13 LAB — GLUCOSE, CAPILLARY
GLUCOSE-CAPILLARY: 150 mg/dL — AB (ref 65–99)
Glucose-Capillary: 101 mg/dL — ABNORMAL HIGH (ref 65–99)

## 2017-05-13 LAB — HEMOGLOBIN A1C
Hgb A1c MFr Bld: 8.3 % — ABNORMAL HIGH (ref 4.8–5.6)
Mean Plasma Glucose: 192 mg/dL

## 2017-05-13 LAB — T3: T3, Total: 86 ng/dL (ref 71–180)

## 2017-05-13 MED ORDER — POTASSIUM CHLORIDE ER 10 MEQ PO TBCR: 10.0000 meq | EXTENDED_RELEASE_TABLET | Freq: Every day | ORAL | 0 refills | Status: DC

## 2017-05-13 MED ORDER — FUROSEMIDE 40 MG PO TABS: 40.0000 mg | ORAL_TABLET | Freq: Every day | ORAL | Status: DC

## 2017-05-13 MED ORDER — PERFLUTREN LIPID MICROSPHERE
INTRAVENOUS | Status: AC
  Administered 2017-05-13: 3 mL via INTRAVENOUS
  Filled 2017-05-13: qty 10

## 2017-05-13 MED ORDER — PERFLUTREN LIPID MICROSPHERE
1.0000 mL | INTRAVENOUS | Status: AC | PRN
  Administered 2017-05-13: 3 mL via INTRAVENOUS
  Filled 2017-05-13: qty 10

## 2017-05-13 MED ORDER — INSULIN GLARGINE 100 UNIT/ML ~~LOC~~ SOLN: 50.0000 [IU] | Freq: Every day | SUBCUTANEOUS | Status: DC

## 2017-05-13 MED ORDER — FUROSEMIDE 40 MG PO TABS: 40.0000 mg | ORAL_TABLET | Freq: Every day | ORAL | 0 refills | Status: DC

## 2017-05-13 NOTE — Progress Notes (Signed)
PROGRESS NOTE    Teresa Hart  NGE:[id number]  DOB: [birth year]  DOA: 05/11/2017 PCP: Abran Richard, PA-C   Brief Admission Hx: Teresa Hart is a 66 y.o. female  with past medical history significant for coronary artery disease, CK D, hypothyroid and diabetes presented to outside hospital emergency room for evaluation of shortness of breath. Patient states that she's had a cough for a little over a week. She did not seek medical care. She felt it was allergies. Went to the emergency room last night due to sudden onset of shortness of breath and crushing chest pain. Patient did try to take a nitroglycerin at home which minimally helped her symptoms. Patient denies any sick contacts. Pt developed elevated troponins and diagnosed with NSTEMI.  MDM/Assessment & Plan:   Acute resp failure with hypoxia - improved now Doubt this is pneumonia given clear CXR, suspect this is from MI Scheduled DuoNeb's When necessary albuterol  DC antibiotics  DC steroids Oxygen therapy Continuous pulse oximetry IS RT consult Flutter valve Hx of asthma  NSTEMI - s/p Cath 6/25 with occluded RCA noted - see full cath report - zofran prn for nausea -Currently without chest pain -Mgmt per cardiology   Diastolic CHF with mild exacerbation  Cont Coreg bid Lasix per cardio recs Echocardiogram pending  Hypertension When necessary hydralazine 10 mg IV as needed for severe blood pressure Cont PO hydralazine  CKD III Monitor Cr daily Cr at baseline,  1.6, now at 2.10 1.87 on admit I&Os  Anxiety/Depression Cont vortioxetine, abilify No SI/HI Prn ativan  Insulin Resistant Type 2 Diabetes Mellitus, 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 Likely exacerbated by steroids which have been discontinued Continue basal bolus while in hospital until we are sure she is eating well SSI ACHS Hold victoza  CAD Mgmt per cardiology  Hypothyroidism Low TSH noted, check Free t4/T3 Pt has a private  endocrinologist to follow up with No signs of hyper or hypothyroidism  Hyperlipidemia Continue statin  GERD Cont PPI & zantc-> pepcid  Code Status: full  DVT Prophylaxis: scd Family Communication: husband Disposition Plan: home when ok with cardiology team  Consultants:  cardiology  Subjective:   Pt says her SOB is much better, not completely resolved but better.  No chest pain.  Says she might be going home today  Objective: Vitals:   05/13/17 0001 05/13/17 0500 05/13/17 0752 05/13/17 0759  BP: 138/66 116/62  (!) 154/73  Pulse: 77 79 86   Resp: 20 (!) 21 19   Temp: 98 F (36.7 C) 97.8 F (36.6 C) 97.8 F (36.6 C)   TempSrc: Oral Oral Oral   SpO2: 99% 99% 94%   Weight:  101.3 kg (223 lb 6.4 oz)    Height:        Intake/Output Summary (Last 24 hours) at 05/13/17 0836 Last data filed at 05/13/17 0800  Gross per 24 hour  Intake          1341.82 ml  Output             1250 ml  Net            91.82 ml   Filed Weights   05/11/17 0900 05/11/17 2017 05/13/17 0500  Weight: 98.9 kg (218 lb) 102.1 kg (225 lb) 101.3 kg (223 lb 6.4 oz)   REVIEW OF SYSTEMS  As per history otherwise all reviewed and reported negative  Exam:  General exam: awake, alert, NAD, cooperative Respiratory system:  No increased work of  breathing. Cardiovascular system: S1 & S2 heard,. No JVD, murmurs, gallops, clicks or pedal edema. Gastrointestinal system: Abdomen is nondistended, soft and nontender. Normal bowel sounds heard. Central nervous system: Alert and oriented. No focal neurological deficits. Extremities: no cyanosis.  Data Reviewed: Basic Metabolic Panel:  Recent Labs Lab 05/11/17 1015 05/12/17 0501 05/13/17 0505  NA 134* 135 139  K 4.4 5.1 4.8  CL 101 101 105  CO2 22 22 26   GLUCOSE 335* 286* 185*  BUN 20 36* 46*  CREATININE 1.74* 1.86* 2.10*  CALCIUM 9.3 9.1 9.5  MG 1.8  --   --   PHOS 3.3  --   --    Liver Function Tests:  Recent Labs Lab 05/11/17 1015  AST  52*  ALT 23  ALKPHOS 92  BILITOT 0.4  PROT 7.1  ALBUMIN 3.7   No results for input(s): LIPASE, AMYLASE in the last 168 hours. No results for input(s): AMMONIA in the last 168 hours. CBC:  Recent Labs Lab 05/11/17 1015 05/12/17 0501 05/13/17 0505  WBC 11.9* 19.6* 17.4*  NEUTROABS 11.3*  --   --   HGB 11.8* 10.9* 11.0*  HCT 37.2 34.7* 35.7*  MCV 93.9 95.3 97.3  PLT 207 206 244   Cardiac Enzymes:  Recent Labs Lab 05/11/17 1015 05/11/17 1435 05/11/17 2146  TROPONINI 4.62* 8.87* 11.71*   CBG (last 3)   Recent Labs  05/12/17 1819 05/12/17 2123 05/13/17 0801  GLUCAP 274* 273* 150*   Recent Results (from the past 240 hour(s))  Surgical PCR screen     Status: None   Collection Time: 05/11/17  2:21 PM  Result Value Ref Range Status   MRSA, PCR NEGATIVE NEGATIVE Final   Staphylococcus aureus NEGATIVE NEGATIVE Final    Comment:        The Xpert SA Assay (FDA approved for NASAL specimens in patients over 65 years of age), is one component of a comprehensive surveillance program.  Test performance has been validated by [facility] for patients greater than or equal to 65 year old. It is not intended to diagnose infection nor to guide or monitor treatment.      Studies: Dg Chest 2 View  Result Date: 05/11/2017 CLINICAL DATA:  Shortness of breath, pneumonia. Preprocedural evaluation for cardiac catheterization. History of hypertension, hyperlipidemia, diabetes, respiratory failure. EXAM: CHEST  2 VIEW COMPARISON:  Chest radiograph May 11, 2017 1115 hours FINDINGS: Stable cardiomegaly. Mediastinal silhouette is nonsuspicious, mildly calcified aortic knob. No pleural effusion or focal consolidation. No pneumothorax. Soft tissue planes and included osseous structures are nonsuspicious. IMPRESSION: Stable cardiomegaly, no acute pulmonary process. Electronically Signed   By: Awilda Metroourtnay  Bloomer M.D.   On: 05/11/2017 22:04   Dg Chest Port 1 View  Result Date:  05/11/2017 CLINICAL DATA:  Pneumonia.  Dry cough. EXAM: PORTABLE CHEST 1 VIEW COMPARISON:  02/02/2016 FINDINGS: Patient slightly rotated to the right. Lungs are adequately inflated without focal consolidation or effusion. Subtle linear atelectasis/scarring left midlung. Stable moderate cardiomegaly. Calcified plaque over the aortic arch. Remainder of the exam is unchanged. IMPRESSION: No acute cardiopulmonary disease. Stable moderate cardiomegaly. Aortic atherosclerosis. Electronically Signed   By: Elberta Fortisaniel  Boyle M.D.   On: 05/11/2017 11:24   Scheduled Meds: . ARIPiprazole  10 mg Oral QPM  . aspirin  81 mg Oral Daily  . atorvastatin  80 mg Oral q1800  . carvedilol  25 mg Oral BID WC  . clopidogrel  75 mg Oral Daily  . guaiFENesin  600 mg Oral  BID  . hydrALAZINE  50 mg Oral TID  . insulin aspart  0-20 Units Subcutaneous TID WC  . insulin aspart  0-5 Units Subcutaneous QHS  . insulin aspart  30 Units Subcutaneous TID WC  . insulin glargine  50 Units Subcutaneous QHS  . pantoprazole  80 mg Oral Daily  . sodium chloride flush  3 mL Intravenous Q12H  . vortioxetine HBr  20 mg Oral Daily   Continuous Infusions: . sodium chloride      Principal Problem:   Non-ST elevation (NSTEMI) myocardial infarction [facility]) Active Problems:   Hypertension   Diabetes mellitus with stage 3 chronic kidney disease (HCC)   CKD (chronic kidney disease), stage III   Respiratory failure with hypoxia (HCC)   Pneumonia   Acute diastolic CHF (congestive heart failure) (HCC)  Time spent:   Standley Dakins, MD, FAAFP [facility] Pager [phone]  If 7PM-7AM, please contact night-coverage [email or web address] Password [facility] 05/13/2017, 8:36 AM    LOS: 2 days

## 2017-05-13 NOTE — Discharge Instructions (Signed)
Follow with Primary MD  Carlton AdamBaucom, Shaune PollackJenny B, PA-C  and other consultant's as instructed your Hospitalist MD  Please get a complete blood count and chemistry panel checked by your Primary MD at your next visit, and again as instructed by your Primary MD.  Get Medicines reviewed and adjusted: Please take all your medications with you for your next visit with your Primary MD  Laboratory/radiological data: Please request your Primary MD to go over all hospital tests and procedure/radiological results at the follow up, please ask your Primary MD to get all Hospital records sent to his/her office.  In some cases, they will be blood work, cultures and biopsy results pending at the time of your discharge. Please request that your primary care M.D. follows up on these results.  Also Note the following: If you experience worsening of your admission symptoms, develop shortness of breath, life threatening emergency, suicidal or homicidal thoughts you must seek medical attention immediately by calling 911 or calling your MD immediately  if symptoms less severe.  You must read complete instructions/literature along with all the possible adverse reactions/side effects for all the Medicines you take and that have been prescribed to you. Take any new Medicines after you have completely understood and accpet all the possible adverse reactions/side effects.   Do not drive when taking Pain medications or sleeping medications (Benzodaizepines)  Do not take more than prescribed Pain, Sleep and Anxiety Medications. It is not advisable to combine anxiety,sleep and pain medications without talking with your primary care practitioner  Special Instructions: If you have smoked or chewed Tobacco  in the last 2 yrs please stop smoking, stop any regular Alcohol  and or any Recreational drug use.  Wear Seat belts while driving.  Please note: You were cared for by a hospitalist during your hospital stay. Once you are  discharged, your primary care physician will handle any further medical issues. Please note that NO REFILLS for any discharge medications will be authorized once you are discharged, as it is imperative that you return to your primary care physician (or establish a relationship with a primary care physician if you do not have one) for your post hospital discharge needs so that they can reassess your need for medications and monitor your lab values.

## 2017-05-13 NOTE — Progress Notes (Signed)
  Echocardiogram 2D Echocardiogram has been performed.  Zetta Stoneman G Lourene Hoston 05/13/2017, 11:01 AM

## 2017-05-13 NOTE — Discharge Summary (Signed)
Physician Discharge Summary  Teresa Hart GNF:[id number] DOB: 03/12/1951 DOA: 05/11/2017  PCP: Abran Richard, PA-C  Admit date: 05/11/2017 Discharge date: 05/13/2017  Admitted From:Home  Disposition: Home  Recommendations for Outpatient Follow-up:  1. Follow up with cardiologist and PCP in 1 weeks 2. Please obtain BMP/CBC in one week 3. Please follow up on the following pending results: final echocardiogram results  Discharge Condition: stable   CODE STATUS: full    Brief Hospitalization Summary: Please see all hospital notes, images, labs for full details of the hospitalization. Brief Admission Hx: Teresa Hart a 66 y.o.femalewith past medical history significant for coronary artery disease, CK D, hypothyroid and diabetes presented to outside hospital emergency room for evaluation of shortness of breath. Patient states that she's had a cough for a little over a week. She did not seek medical care. She felt it was allergies. Went to the emergency room last night due to sudden onset of shortness of breath and crushing chest pain. Patient did try to take a nitroglycerin at home which minimally helped her symptoms. Patient denies any sick contacts. Pt developed elevated troponins and diagnosed with NSTEMI.  Pt went to cardiac cath on 6/25.   MDM/Assessment & Plan:   Acute resp failure with hypoxia - improved now Doubt this is pneumonia given clear CXR, suspect this is from MI Scheduled DuoNeb's When necessary albuterol  DC antibiotics  DC steroids Oxygen therapy Continuous pulse oximetry IS RT consult Flutter valve Hx of asthma  NSTEMI - s/p Cath 6/25 with occluded RCA noted - see full cath report - zofran prn for nausea -Currently without chest pain -Mgmt per cardiology - see notes, says it is ok to discharge home with follow up in 1 week.   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 Likely exacerbated by steroids which have been discontinued Continue basal bolus while in hospital until we are sure she is eating well SSI ACHS Hold victoza  CAD Mgmt per cardiology  Hypothyroidism Low TSH noted, check Free t4/T3 Pt has a private endocrinologist to follow up with No signs of hyper or hypothyroidism  Hyperlipidemia Continue statin  GERD Cont PPI & zantc-> pepcid  Code Status:full DVT Prophylaxis: scd Family Communication:husband Disposition Plan:home   Consultants:  cardiology  Brief Admission Hx: Teresa Hart a 66 y.o.femalewith past medical history significant for coronary artery disease, CK D, hypothyroid and diabetes presented to outside hospital emergency room for evaluation of shortness of breath. Patient states that she's had a cough for a little over a week. She did not seek medical care. She felt it was allergies. Went to the emergency room last night due to sudden onset of shortness of breath and crushing chest pain. Patient did try to take a nitroglycerin at home which minimally helped her symptoms. Patient denies any sick contacts. Pt developed elevated troponins and diagnosed with NSTEMI.  MDM/Assessment & Plan:   Acute resp failure with hypoxia - improved now Doubt this is pneumonia given clear CXR, suspect this is from MI Scheduled DuoNeb's When necessary albuterol  DC antibiotics  DC steroids Oxygen therapy Continuous pulse oximetry IS RT consult Flutter valve Hx of asthma  NSTEMI - s/p Cath 6/25 with occluded  RCA noted - see full cath report - zofran prn for nausea -Currently without chest pain -Mgmt per cardiology   Diastolic CHF with mild exacerbation  Cont Coreg bid Lasix per cardio recs Echocardiogram pending  Hypertension When necessary hydralazine 10 mg IV as needed for severe blood pressure Cont PO hydralazine  CKD III Monitor Cr daily Cr at baseline, 1.6, now at 2.10 1.87 on admit I&Os  Anxiety/Depression Cont vortioxetine, abilify No SI/HI Prn ativan  Insulin Resistant Type 2 Diabetes Mellitus, 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 Likely exacerbated by steroids which have been discontinued Continue basal bolus while in hospital until we are sure she is eating well SSI ACHS Hold victoza  CAD Mgmt per cardiology  Hypothyroidism Low TSH noted, check Free t4/T3 Pt has a private endocrinologist to follow up with No signs of hyper or hypothyroidism  Hyperlipidemia Continue statin  GERD Cont PPI & zantc-> pepcid  Code Status:full DVT Prophylaxis: scd Family Communication:husband Disposition Plan:home when ok with cardiology team  Consultants:  cardiology  Discharge Diagnoses:  Principal Problem:   Non-ST elevation (NSTEMI) myocardial infarction [facility]) Active Problems:   Hypertension   Diabetes mellitus with stage 3 chronic kidney disease (HCC)   CKD (chronic kidney disease), stage III   Respiratory failure with hypoxia (HCC)   Pneumonia   Acute diastolic CHF (congestive heart failure) (HCC)    Discharge Instructions: Discharge Instructions    Increase activity slowly    Complete by:  As directed      Allergies as of 05/13/2017      Reactions   Brilinta [ticagrelor] Anaphylaxis   Cinnamon Anaphylaxis, Other (See Comments)   Also, blisters in mouth   Fish-derived Products Anaphylaxis, Hives   Other Anaphylaxis, Hives   NO TREE NUTS (Pecans and Walnuts especially)   Penicillins Anaphylaxis, Hives, Swelling   Has patient had a PCN  reaction causing immediate rash, facial/tongue/throat swelling, SOB or lightheadedness with hypotension: Yes Has patient had a PCN reaction causing severe rash involving mucus membranes or skin necrosis: No Has patient had a PCN reaction that required hospitalization: No Has patient had a PCN reaction occurring within the last 10 years: No If all of the above answers are "NO", then may proceed with Cephalosporin use.   Strawberry Extract Anaphylaxis, Hives   Amoxicillin-pot Clavulanate Nausea And Vomiting   Crestor [rosuvastatin]    Muscle spasms and cramps   Levaquin [levofloxacin] Other (See Comments)   Per the patient's notes...Marland Kitchen"MD also had concerns regarding levaquin given cardiac history.."      Medication List    TAKE these medications   ACCU-CHEK AVIVA PLUS test strip Generic drug:  glucose blood CHECK BLOOD SUGAR 4 TIMES DAILY.   PROAIR HFA 108 (90 Base) MCG/ACT inhaler Generic drug:  albuterol Inhale 2 puffs into the lungs every 4 (four) hours as needed for wheezing or shortness of breath.   albuterol 108 (90 Base) MCG/ACT inhaler Commonly known as:  PROVENTIL HFA;VENTOLIN HFA Inhale 2 puffs into the lungs every 6 (six) hours as needed.   ARIPiprazole 20 MG tablet Commonly known as:  ABILIFY Take 10 mg by mouth every evening.   aspirin EC 81 MG tablet Take 1 tablet (81 mg total) by mouth daily.   atorvastatin 80 MG tablet Commonly known as:  LIPITOR TAKE 1 TABLET BY MOUTH AT BEDTIME FOR CHOLESTEROL  carvedilol 25 MG tablet Commonly known as:  COREG TAKE (1) TABLET TWICE A DAY WITH FOOD---BREAKFAST AND SUPPER. What changed:  See the new instructions.   clopidogrel 75 MG tablet Commonly known as:  PLAVIX TAKE 1 TABLET BY MOUTH ONCE DAILY. What changed:  See the new instructions.   diazepam 5 MG tablet Commonly known as:  VALIUM Take 5 mg by mouth every 6 (six) hours as needed for anxiety.   furosemide 40 MG tablet Commonly known as:  LASIX Take 1 tablet  (40 mg total) by mouth daily. Start taking on:  05/14/2017 What changed:  medication strength  how much to take   HUMULIN R U-500 KWIKPEN 500 UNIT/ML kwikpen Generic drug:  insulin regular human CONCENTRATED Inject 50-60 Units into the skin 3 (three) times daily with meals.   hydrALAZINE 50 MG tablet Commonly known as:  APRESOLINE Take 1 tablet (50 mg total) by mouth 3 (three) times daily.   Insulin Pen Needle 31G X 8 MM Misc Commonly known as:  B-D ULTRAFINE III SHORT PEN 1 each by Does not apply route as directed.   levocetirizine 5 MG tablet Commonly known as:  XYZAL Take 1 tablet (5 mg total) by mouth every evening. What changed:  when to take this  reasons to take this   liraglutide 18 MG/3ML Sopn Commonly known as:  VICTOZA Inject 0.3 mLs (1.8 mg total) into the skin at bedtime.   nitroGLYCERIN 0.4 MG SL tablet Commonly known as:  NITROSTAT Place 1 tablet (0.4 mg total) under the tongue every 5 (five) minutes as needed for chest pain.   omeprazole 40 MG capsule Commonly known as:  PRILOSEC Take 40 mg by mouth daily before breakfast.   potassium chloride 10 MEQ tablet Commonly known as:  K-DUR Take 1 tablet (10 mEq total) by mouth daily.   ranitidine 150 MG tablet Commonly known as:  ZANTAC Take 1 tablet (150 mg total) by mouth daily. What changed:  when to take this   traMADol 50 MG tablet Commonly known as:  ULTRAM Take 1 tablet (50 mg total) by mouth every 6 (six) hours as needed. What changed:  reasons to take this   TRINTELLIX 20 MG Tabs Generic drug:  vortioxetine HBr Take 20 mg by mouth daily.   Vitamin D3 5000 units Caps Take 5,000 Units by mouth daily.      Follow-up Information    Jonelle Sidle, MD Follow up on 05/27/2017.   Specialty:  Cardiology Why:  at 9:40am for your follow up appt.  Contact information: [address] [place] [address] [phone]        Abran Richard, PA-C. Schedule an appointment as soon as  possible for a visit in 1 week(s).   Specialty:  Physician Assistant Why:  Hospital Follow Up  Contact information: [address] [place] [address] [phone]          Allergies  Allergen Reactions  . Brilinta [Ticagrelor] Anaphylaxis  . Cinnamon Anaphylaxis and Other (See Comments)    Also, blisters in mouth  . Fish-Derived Products Anaphylaxis and Hives  . Other Anaphylaxis and Hives    NO TREE NUTS (Pecans and Walnuts especially)  . Penicillins Anaphylaxis, Hives and Swelling    Has patient had a PCN reaction causing immediate rash, facial/tongue/throat swelling, SOB or lightheadedness with hypotension: Yes Has patient had a PCN reaction causing severe rash involving mucus membranes or skin necrosis: No Has patient had a PCN reaction  that required hospitalization: No Has patient had a PCN reaction occurring within the last 10 years: No If all of the above answers are "NO", then may proceed with Cephalosporin use.   . Strawberry Extract Anaphylaxis and Hives  . Amoxicillin-Pot Clavulanate Nausea And Vomiting  . Crestor [Rosuvastatin]     Muscle spasms and cramps  . Levaquin [Levofloxacin] Other (See Comments)    Per the patient's notes...Marland Kitchen"MD also had concerns regarding levaquin given cardiac history.."   Current Discharge Medication List    START taking these medications   Details  potassium chloride (K-DUR) 10 MEQ tablet Take 1 tablet (10 mEq total) by mouth daily. Qty: 30 tablet, Refills: 0      CONTINUE these medications which have CHANGED   Details  furosemide (LASIX) 40 MG tablet Take 1 tablet (40 mg total) by mouth daily. Qty: 30 tablet, Refills: 0      CONTINUE these medications which have NOT CHANGED   Details  !! albuterol (PROAIR HFA) 108 (90 Base) MCG/ACT inhaler Inhale 2 puffs into the lungs every 4 (four) hours as needed for wheezing or shortness of breath.    ARIPiprazole (ABILIFY) 20 MG tablet Take 10 mg by mouth every evening.      aspirin EC 81 MG tablet Take 1 tablet (81 mg total) by mouth daily. Qty: 150 tablet, Refills: 2    atorvastatin (LIPITOR) 80 MG tablet TAKE 1 TABLET BY MOUTH AT BEDTIME FOR CHOLESTEROL Qty: 90 tablet, Refills: 0    carvedilol (COREG) 25 MG tablet TAKE (1) TABLET TWICE A DAY WITH FOOD---BREAKFAST AND SUPPER. Qty: 180 tablet, Refills: 0    Cholecalciferol (VITAMIN D3) 5000 units CAPS Take 5,000 Units by mouth daily.    clopidogrel (PLAVIX) 75 MG tablet TAKE 1 TABLET BY MOUTH ONCE DAILY. Qty: 90 tablet, Refills: 0    diazepam (VALIUM) 5 MG tablet Take 5 mg by mouth every 6 (six) hours as needed for anxiety.    HUMULIN R U-500 KWIKPEN 500 UNIT/ML kwikpen Inject 50-60 Units into the skin 3 (three) times daily with meals.     hydrALAZINE (APRESOLINE) 50 MG tablet Take 1 tablet (50 mg total) by mouth 3 (three) times daily. Qty: 270 tablet, Refills: 3    levocetirizine (XYZAL) 5 MG tablet Take 1 tablet (5 mg total) by mouth every evening. Qty: 30 tablet, Refills: 5    Liraglutide (VICTOZA) 18 MG/3ML SOPN Inject 0.3 mLs (1.8 mg total) into the skin at bedtime. Qty: 27 pen, Refills: 0    nitroGLYCERIN (NITROSTAT) 0.4 MG SL tablet Place 1 tablet (0.4 mg total) under the tongue every 5 (five) minutes as needed for chest pain. Qty: 25 tablet, Refills: 3    omeprazole (PRILOSEC) 40 MG capsule Take 40 mg by mouth daily before breakfast.     ranitidine (ZANTAC) 150 MG tablet Take 1 tablet (150 mg total) by mouth daily. Qty: 90 tablet, Refills: 3    traMADol (ULTRAM) 50 MG tablet Take 1 tablet (50 mg total) by mouth every 6 (six) hours as needed. Qty: 56 tablet, Refills: 0   Associated Diagnoses: Contusion of left knee, initial encounter    Vortioxetine HBr (TRINTELLIX) 20 MG TABS Take 20 mg by mouth daily.     ACCU-CHEK AVIVA PLUS test strip CHECK BLOOD SUGAR 4 TIMES DAILY. Qty: 450 each, Refills: 2    !! albuterol (PROVENTIL HFA;VENTOLIN HFA) 108 (90 BASE) MCG/ACT inhaler Inhale 2  puffs into the lungs every 6 (six) hours as needed. Qty: 18  g, Refills: 2   Associated Diagnoses: Asthma    Insulin Pen Needle (B-D ULTRAFINE III SHORT PEN) 31G X 8 MM MISC 1 each by Does not apply route as directed. Qty: 150 each, Refills: 3     !! - Potential duplicate medications found. Please discuss with provider.      Procedures/Studies: Dg Chest 2 View  Result Date: 05/11/2017 CLINICAL DATA:  Shortness of breath, pneumonia. Preprocedural evaluation for cardiac catheterization. History of hypertension, hyperlipidemia, diabetes, respiratory failure. EXAM: CHEST  2 VIEW COMPARISON:  Chest radiograph May 11, 2017 1115 hours FINDINGS: Stable cardiomegaly. Mediastinal silhouette is nonsuspicious, mildly calcified aortic knob. No pleural effusion or focal consolidation. No pneumothorax. Soft tissue planes and included osseous structures are nonsuspicious. IMPRESSION: Stable cardiomegaly, no acute pulmonary process. Electronically Signed   By: Awilda Metro M.D.   On: 05/11/2017 22:04   Dg Chest Port 1 View  Result Date: 05/11/2017 CLINICAL DATA:  Pneumonia.  Dry cough. EXAM: PORTABLE CHEST 1 VIEW COMPARISON:  02/02/2016 FINDINGS: Patient slightly rotated to the right. Lungs are adequately inflated without focal consolidation or effusion. Subtle linear atelectasis/scarring left midlung. Stable moderate cardiomegaly. Calcified plaque over the aortic arch. Remainder of the exam is unchanged. IMPRESSION: No acute cardiopulmonary disease. Stable moderate cardiomegaly. Aortic atherosclerosis. Electronically Signed   By: Elberta Fortis M.D.   On: 05/11/2017 11:24      Subjective: Pt says she feels well to go home.  No chest pain and SOB nearly resolved fully.   Discharge Exam: Vitals:   05/13/17 0759 05/13/17 1136  BP: (!) 154/73 130/67  Pulse:  71  Resp:  (!) 23  Temp:  97.7 F (36.5 C)   Vitals:   05/13/17 0500 05/13/17 0752 05/13/17 0759 05/13/17 1136  BP: 116/62  (!) 154/73  130/67  Pulse: 79 86  71  Resp: (!) 21 19  (!) 23  Temp: 97.8 F (36.6 C) 97.8 F (36.6 C)  97.7 F (36.5 C)  TempSrc: Oral Oral  Oral  SpO2: 99% 94%  99%  Weight: 101.3 kg (223 lb 6.4 oz)     Height:       General: Pt is alert, awake, not in acute distress Cardiovascular: RRR, S1/S2 +, no rubs, no gallops Respiratory: CTA bilaterally, no wheezing, no rhonchi Abdominal: Soft, NT, ND, bowel sounds + Extremities: no edema, no cyanosis   The results of significant diagnostics from this hospitalization (including imaging, microbiology, ancillary and laboratory) are listed below for reference.     Microbiology: Recent Results (from the past 240 hour(s))  Surgical PCR screen     Status: None   Collection Time: 05/11/17  2:21 PM  Result Value Ref Range Status   MRSA, PCR NEGATIVE NEGATIVE Final   Staphylococcus aureus NEGATIVE NEGATIVE Final    Comment:        The Xpert SA Assay (FDA approved for NASAL specimens in patients over 54 years of age), is one component of a comprehensive surveillance program.  Test performance has been validated by [facility] for patients greater than or equal to 106 year old. It is not intended to diagnose infection nor to guide or monitor treatment.      Labs: BNP (last 3 results)  Recent Labs  05/11/17 1435  BNP 478.5*   Basic Metabolic Panel:  Recent Labs Lab 05/11/17 1015 05/12/17 0501 05/13/17 0505  NA 134* 135 139  K 4.4 5.1 4.8  CL 101 101 105  CO2 22 22 26   GLUCOSE  335* 286* 185*  BUN 20 36* 46*  CREATININE 1.74* 1.86* 2.10*  CALCIUM 9.3 9.1 9.5  MG 1.8  --   --   PHOS 3.3  --   --    Liver Function Tests:  Recent Labs Lab 05/11/17 1015  AST 52*  ALT 23  ALKPHOS 92  BILITOT 0.4  PROT 7.1  ALBUMIN 3.7   No results for input(s): LIPASE, AMYLASE in the last 168 hours. No results for input(s): AMMONIA in the last 168 hours. CBC:  Recent Labs Lab 05/11/17 1015 05/12/17 0501 05/13/17 0505  WBC 11.9*  19.6* 17.4*  NEUTROABS 11.3*  --   --   HGB 11.8* 10.9* 11.0*  HCT 37.2 34.7* 35.7*  MCV 93.9 95.3 97.3  PLT 207 206 244   Cardiac Enzymes:  Recent Labs Lab 05/11/17 1015 05/11/17 1435 05/11/17 2146  TROPONINI 4.62* 8.87* 11.71*   BNP: Invalid input(s): POCBNP CBG:  Recent Labs Lab 05/12/17 1712 05/12/17 1819 05/12/17 2123 05/13/17 0801 05/13/17 1134  GLUCAP 283* 274* 273* 150* 101*   D-Dimer No results for input(s): DDIMER in the last 72 hours. Hgb A1c  Recent Labs  05/12/17 0501  HGBA1C 8.3*   Lipid Profile No results for input(s): CHOL, HDL, LDLCALC, TRIG, CHOLHDL, LDLDIRECT in the last 72 hours. Thyroid function studies  Recent Labs  05/11/17 1822  TSH 0.040*   Anemia work up No results for input(s): VITAMINB12, FOLATE, FERRITIN, TIBC, IRON, RETICCTPCT in the last 72 hours. Urinalysis    Component Value Date/Time   BILIRUBINUR neg 11/16/2012 1319   PROTEINUR neg 11/16/2012 1319   UROBILINOGEN 0.2 11/16/2012 1319   NITRITE neg 11/16/2012 1319   LEUKOCYTESUR Trace 11/16/2012 1319   Sepsis Labs Invalid input(s): PROCALCITONIN,  WBC,  LACTICIDVEN Microbiology Recent Results (from the past 240 hour(s))  Surgical PCR screen     Status: None   Collection Time: 05/11/17  2:21 PM  Result Value Ref Range Status   MRSA, PCR NEGATIVE NEGATIVE Final   Staphylococcus aureus NEGATIVE NEGATIVE Final    Comment:        The Xpert SA Assay (FDA approved for NASAL specimens in patients over 65 years of age), is one component of a comprehensive surveillance program.  Test performance has been validated by [facility] for patients greater than or equal to 65 year old. It is not intended to diagnose infection nor to guide or monitor treatment.     Time coordinating discharge: 33 mins  SIGNED:  Standley Dakinslanford Kylle Lall, MD  [facility] 05/13/2017, 12:54 PM Pager [phone]  If 7PM-7AM, please contact night-coverage [email or web address] Password  [email or web address]

## 2017-05-13 NOTE — Progress Notes (Signed)
Progress Note  Patient Name: Teresa Hart Date of Encounter: 05/13/2017  Primary Cardiologist: Diona Browner   Subjective   The patient is laying flat in bed looking and stating that she is comfortable.  Inpatient Medications    Scheduled Meds: . ARIPiprazole  10 mg Oral QPM  . aspirin  81 mg Oral Daily  . atorvastatin  80 mg Oral q1800  . carvedilol  25 mg Oral BID WC  . clopidogrel  75 mg Oral Daily  . guaiFENesin  600 mg Oral BID  . hydrALAZINE  50 mg Oral TID  . insulin aspart  0-20 Units Subcutaneous TID WC  . insulin aspart  0-5 Units Subcutaneous QHS  . insulin aspart  30 Units Subcutaneous TID WC  . insulin glargine  50 Units Subcutaneous QHS  . pantoprazole  80 mg Oral Daily  . perflutren lipid microspheres (DEFINITY) IV suspension      . sodium chloride flush  3 mL Intravenous Q12H  . vortioxetine HBr  20 mg Oral Daily   Continuous Infusions: . sodium chloride     PRN Meds: sodium chloride, acetaminophen, albuterol, dextrose, diazepam, hydrALAZINE, morphine injection, nitroGLYCERIN, ondansetron (ZOFRAN) IV, sodium chloride flush   Vital Signs    Vitals:   05/13/17 0001 05/13/17 0500 05/13/17 0752 05/13/17 0759  BP: 138/66 116/62  (!) 154/73  Pulse: 77 79 86   Resp: 20 (!) 21 19   Temp: 98 F (36.7 C) 97.8 F (36.6 C) 97.8 F (36.6 C)   TempSrc: Oral Oral Oral   SpO2: 99% 99% 94%   Weight:  223 lb 6.4 oz (101.3 kg)    Height:        Intake/Output Summary (Last 24 hours) at 05/13/17 1031 Last data filed at 05/13/17 0900  Gross per 24 hour  Intake          1081.82 ml  Output             1250 ml  Net          -168.18 ml   Filed Weights   05/11/17 0900 05/11/17 2017 05/13/17 0500  Weight: 218 lb (98.9 kg) 225 lb (102.1 kg) 223 lb 6.4 oz (101.3 kg)    Telemetry    SR - Personally Reviewed  ECG    SR with anterolateral ST depression and TWIs - Personally Reviewed  Physical Exam   General:Obese older W female appearing in no acute  distress. Head: Normocephalic, atraumatic.  Neck: Supple without bruits, JVD. Lungs:  Resp regular and unlabored, Diminished bilaterally in lower lobes. No crackles. Heart: RRR, S1, S2, no S3, S4, or murmur; no rub. Abdomen: Soft, non-tender, non-distended with normoactive bowel sounds. No hepatomegaly. No rebound/guarding. No obvious abdominal masses. Extremities: No clubbing, cyanosis, edema. Distal pedal pulses are 2+ bilaterally. Neuro: Alert and oriented X 3. Moves all extremities spontaneously. Psych: Normal affect.  Labs    Chemistry  Recent Labs Lab 05/11/17 1015 05/12/17 0501 05/13/17 0505  NA 134* 135 139  K 4.4 5.1 4.8  CL 101 101 105  CO2 22 22 26   GLUCOSE 335* 286* 185*  BUN 20 36* 46*  CREATININE 1.74* 1.86* 2.10*  CALCIUM 9.3 9.1 9.5  PROT 7.1  --   --   ALBUMIN 3.7  --   --   AST 52*  --   --   ALT 23  --   --   ALKPHOS 92  --   --   BILITOT 0.4  --   --  GFRNONAA 30* 28* 24*  GFRAA 35* 32* 28*  ANIONGAP 11 12 8      Hematology  Recent Labs Lab 05/11/17 1015 05/12/17 0501 05/13/17 0505  WBC 11.9* 19.6* 17.4*  RBC 3.96 3.64* 3.67*  HGB 11.8* 10.9* 11.0*  HCT 37.2 34.7* 35.7*  MCV 93.9 95.3 97.3  MCH 29.8 29.9 30.0  MCHC 31.7 31.4 30.8  RDW 14.4 14.6 15.1  PLT 207 206 244    Cardiac Enzymes  Recent Labs Lab 05/11/17 1015 05/11/17 1435 05/11/17 2146  TROPONINI 4.62* 8.87* 11.71*   No results for input(s): TROPIPOC in the last 168 hours.   BNP  Recent Labs Lab 05/11/17 1435  BNP 478.5*     DDimer No results for input(s): DDIMER in the last 168 hours.    Radiology    Dg Chest 2 View  Result Date: 05/11/2017 CLINICAL DATA:  Shortness of breath, pneumonia. Preprocedural evaluation for cardiac catheterization. History of hypertension, hyperlipidemia, diabetes, respiratory failure. EXAM: CHEST  2 VIEW COMPARISON:  Chest radiograph May 11, 2017 1115 hours FINDINGS: Stable cardiomegaly. Mediastinal silhouette is nonsuspicious,  mildly calcified aortic knob. No pleural effusion or focal consolidation. No pneumothorax. Soft tissue planes and included osseous structures are nonsuspicious. IMPRESSION: Stable cardiomegaly, no acute pulmonary process. Electronically Signed   By: Awilda Metro M.D.   On: 05/11/2017 22:04   Dg Chest Port 1 View  Result Date: 05/11/2017 CLINICAL DATA:  Pneumonia.  Dry cough. EXAM: PORTABLE CHEST 1 VIEW COMPARISON:  02/02/2016 FINDINGS: Patient slightly rotated to the right. Lungs are adequately inflated without focal consolidation or effusion. Subtle linear atelectasis/scarring left midlung. Stable moderate cardiomegaly. Calcified plaque over the aortic arch. Remainder of the exam is unchanged. IMPRESSION: No acute cardiopulmonary disease. Stable moderate cardiomegaly. Aortic atherosclerosis. Electronically Signed   By: Elberta Fortis M.D.   On: 05/11/2017 11:24    Cardiac Studies   Left cardiac cath: 05/12/2017   Patent proximal to mid RCA stent.  Ost LAD to Prox LAD lesion, 30 %stenosed.  Mid LAD to Dist LAD lesion, 60 %stenosed.  Ost 2nd Mrg to 2nd Mrg lesion, 90 %stenosed.  2nd Mrg lesion, 99 %stenosed. Diffusely diseased vessel.  Mid RCA lesion, 100 %stenosed. This is the culprit. There are left to right collaterals.  LV end diastolic pressure is moderately elevated.  There is no aortic valve stenosis.  Hemodynamic findings consistent with mild to moderate pulmonary hypertension.  CO 5.6 L/min. CI 2.7. PA sat 64 %. Unable to wedge the catheter.   Occluded RCA appears to be the cause of her positive troponin.  Her SOB is from volume overload and her elevated LVEDP.  No chest pain in over 24 hours.  No benefit from revascularization at this point.    Continue diuresis with careful check of her renal function.  Aggressive secondary prevention.    Patient Profile     66 y.o. female with PMH of hx of CAD, CKD stage 3, chronic diastolic HF, and asthma who presented with  dyspnea and chest tightness.    Assessment & Plan    1. NSTEMI: Trop peaked at 11.71. No further chest pain. The cath yesterday showed occluded RCA that appears to be the cause of her positive troponin.  Her SOB is from volume overload and her elevated LVEDP.  No chest pain in over 24 hours.  No benefit from revascularization at this point.   - Crea up this am 1.86-> 2.1 - she doesn't appear fluid overloaded and is laying comfortably  in bed in flat position, she walked this morning without significant SOB - bedside echo is being performed, I have reviewed it, it shows normal LVEF 55-60% with no regional wall motion abnormalities including the inferior wall - the patient is insisting on discharge - She is stable to be discharged home on Lasix 40 mg po daily starting tomorrow - we will arrange for an outpatient follow up within 7 days with BMP and BNP at that time -- continue ASA, plavix, statin (allergic to Brilinta)  2. Acute on Chronic combined HF: Presented with dyspnea. BNP 478. Given IV lasix 60mg  x1. No documented UOP, but reports good UOP. States her breathing has improved today. Able to lay flat in the bed.  -- [facility], will likely need further diuresis post cath -- echo pending  3. PNA: Treated with antibiotics on admission. CXR yesterday with no acute findings. WBC 19.6.  -- management per primary  4. CKD: Cr 1.7>>1.86 today. Baseline appears around 1.5. Will need to monitor closely with plans for cardiac cath.   Tobias AlexanderKatarina Kalisi Bevill, MD 05/13/2017 10:31 AM

## 2017-05-13 NOTE — Progress Notes (Signed)
Patient and her husband received all discharge information and education, as well as her medication that was held in pharmacy. Patient and her husband verbalize understanding of the discharge plan.
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
## 2017-05-20 ENCOUNTER — Ambulatory Visit (INDEPENDENT_AMBULATORY_CARE_PROVIDER_SITE_OTHER): Payer: Medicare Other | Admitting: Psychiatry

## 2017-05-20 ENCOUNTER — Encounter (HOSPITAL_COMMUNITY): Payer: Self-pay | Admitting: Psychiatry

## 2017-05-20 ENCOUNTER — Encounter (INDEPENDENT_AMBULATORY_CARE_PROVIDER_SITE_OTHER): Payer: Self-pay

## 2017-05-20 VITALS — BP 124/76 | HR 67 | Ht 65.0 in | Wt 223.0 lb

## 2017-05-20 DIAGNOSIS — Z881 Allergy status to other antibiotic agents status: Secondary | ICD-10-CM

## 2017-05-20 DIAGNOSIS — E785 Hyperlipidemia, unspecified: Secondary | ICD-10-CM | POA: Diagnosis not present

## 2017-05-20 DIAGNOSIS — E1022 Type 1 diabetes mellitus with diabetic chronic kidney disease: Secondary | ICD-10-CM | POA: Diagnosis not present

## 2017-05-20 DIAGNOSIS — F3340 Major depressive disorder, recurrent, in remission, unspecified: Secondary | ICD-10-CM | POA: Diagnosis not present

## 2017-05-20 DIAGNOSIS — Z7982 Long term (current) use of aspirin: Secondary | ICD-10-CM | POA: Diagnosis not present

## 2017-05-20 DIAGNOSIS — Z818 Family history of other mental and behavioral disorders: Secondary | ICD-10-CM

## 2017-05-20 DIAGNOSIS — Z794 Long term (current) use of insulin: Secondary | ICD-10-CM | POA: Diagnosis not present

## 2017-05-20 DIAGNOSIS — F419 Anxiety disorder, unspecified: Secondary | ICD-10-CM

## 2017-05-20 DIAGNOSIS — Z79891 Long term (current) use of opiate analgesic: Secondary | ICD-10-CM

## 2017-05-20 DIAGNOSIS — N183 Chronic kidney disease, stage 3 (moderate): Secondary | ICD-10-CM

## 2017-05-20 DIAGNOSIS — I214 Non-ST elevation (NSTEMI) myocardial infarction: Secondary | ICD-10-CM | POA: Diagnosis not present

## 2017-05-20 DIAGNOSIS — Z888 Allergy status to other drugs, medicaments and biological substances status: Secondary | ICD-10-CM

## 2017-05-20 DIAGNOSIS — Z79899 Other long term (current) drug therapy: Secondary | ICD-10-CM

## 2017-05-20 DIAGNOSIS — Z811 Family history of alcohol abuse and dependence: Secondary | ICD-10-CM

## 2017-05-20 DIAGNOSIS — Z880 Allergy status to penicillin: Secondary | ICD-10-CM

## 2017-05-20 DIAGNOSIS — I129 Hypertensive chronic kidney disease with stage 1 through stage 4 chronic kidney disease, or unspecified chronic kidney disease: Secondary | ICD-10-CM | POA: Diagnosis not present

## 2017-05-20 DIAGNOSIS — F3342 Major depressive disorder, recurrent, in full remission: Secondary | ICD-10-CM | POA: Insufficient documentation

## 2017-05-20 MED ORDER — VORTIOXETINE HBR 20 MG PO TABS: 20.0000 mg | ORAL_TABLET | Freq: Every day | ORAL | 1 refills | Status: DC

## 2017-05-20 NOTE — Patient Instructions (Addendum)
1. Continue Trintellix 20 mg daily 2. Continue Abilify 10 mg daily 3. Continue Valium 5 mg daily as needed for anxiety 4. Return to clinic in one month

## 2017-05-26 NOTE — Progress Notes (Signed)
Cardiology Office Note  Date: 05/27/2017   ID: Teresa Hart, DOB 05/20/1951, MRN [id number]  PCP: Phyllis Ginger  Primary Cardiologist: Nona Dell, MD   Chief Complaint  Patient presents with  . Coronary Artery Disease    History of Present Illness: Teresa Hart is a 67 y.o. female that I last saw in January. I reviewed interval records, she was just recently discharged from [facility]: [facility] in late June. She was diagnosed with NSTEMI, peak troponin I of 11.71, associated with diastolic heart failure exacerbation. Cardiac catheterization revealed patent RCA stent sites, nonobstructive disease within the LAD, diffusely diseased obtuse marginal system, and culprit lesion felt to be mid RCA which was occluded and associated with left to right collaterals. This was managed medically.  She presents today for follow-up. Reports no chest pain but complains of recurrent cough and wheezing. Her weight is down compared to June and she states she has been compliant with Lasix. She states that she has a history of recurrent bronchitis. She is on MDIs at home. She does not follow with a pulmonologist. She denies any fevers or chills.  I reviewed her medications. Current cardiac regimen includes aspirin, Plavix, Coreg, Lipitor, Lasix, potassium supplements, and hydralazine.  Past Medical History:  Diagnosis Date  . Aortic stenosis, mild    a. per echo Jan 2013; EF is 61%  . Asthma   . CAD (coronary artery disease)    a. 11/2011 abnl MV; b. 11/2011 Cath: No obstructive disease in the large caliber vessels, disease in the small OM2 and distal LAD, not amenable to PCI or grafting; c. 01/2016 PCI: LM nl, LAD 30ost, 68m, LCX nl, OM1 small, OM2 90 diff, OM3 small, RCA  95p/m 2.75 x 24 Promus Premier DES, 68m. Severe OM2 managed medically.  . Cardiac enlargement   . Carotid stenosis, bilateral    a. per doppler Jan 2013; 60-79% RICA, 40-59% LICA  . CKD (chronic kidney disease), stage III     . GERD (gastroesophageal reflux disease)   . Hyperlipidemia   . Hypertensive heart disease   . Hypothyroid   . Obese   . Type I diabetes mellitus (HCC)     Past Surgical History:  Procedure Laterality Date  . ABDOMINAL HYSTERECTOMY    . APPENDECTOMY    . CARDIAC CATHETERIZATION  Jan 2013   No major obstructive disease in the large caliber vessels, with disease in a small OM2 and distal LAD; not amenable to PCI or grafting.   Marland Kitchen CARDIAC CATHETERIZATION N/A 02/05/2016   Procedure: Left Heart Cath and Coronary Angiography;  Surgeon: Peter M Swaziland, MD;  Location: [facility] INVASIVE CV LAB;  Service: Cardiovascular;  Laterality: N/A;  . CARDIAC CATHETERIZATION  02/05/2016   Procedure: Coronary Stent Intervention;  Surgeon: Peter M Swaziland, MD;  Location: [facility] INVASIVE CV LAB;  Service: Cardiovascular;;  . CARDIAC CATHETERIZATION N/A 02/20/2016   Procedure: Coronary Stent Intervention;  Surgeon: Lyn Records, MD;  Location: [facility] INVASIVE CV LAB;  Service: Cardiovascular;  Laterality: N/A;  . CATARACT EXTRACTION  08/2016  . KNEE ARTHROSCOPY  06/24/2012   Procedure: ARTHROSCOPY KNEE;  Surgeon: Nestor Lewandowsky, MD;  Location:  SURGERY CENTER;  Service: Orthopedics;  Laterality: Right;  DEBRIDEMENT OF CHONDROMALACIA  . KNEE SURGERY    . RIGHT/LEFT HEART CATH AND CORONARY ANGIOGRAPHY N/A 05/12/2017   Procedure: Right/Left Heart Cath and Coronary Angiography;  Surgeon: Corky Crafts, MD;  Location: [facility] INVASIVE CV LAB;  Service: Cardiovascular;  Laterality: N/A;  . TONSILLECTOMY      Current Outpatient Prescriptions  Medication Sig Dispense Refill  . ACCU-CHEK AVIVA PLUS test strip CHECK BLOOD SUGAR 4 TIMES DAILY. 450 each 2  . albuterol (PROAIR HFA) 108 (90 Base) MCG/ACT inhaler Inhale 2 puffs into the lungs every 4 (four) hours as needed for wheezing or shortness of breath.    Marland Kitchen albuterol (PROVENTIL HFA;VENTOLIN HFA) 108 (90 BASE) MCG/ACT inhaler Inhale 2 puffs into the lungs every 6 (six) hours  as needed. 18 g 2  . ARIPiprazole (ABILIFY) 20 MG tablet Take 10 mg by mouth every evening.     Marland Kitchen aspirin EC 81 MG tablet Take 1 tablet (81 mg total) by mouth daily. 150 tablet 2  . atorvastatin (LIPITOR) 80 MG tablet TAKE 1 TABLET BY MOUTH AT BEDTIME FOR CHOLESTEROL 90 tablet 0  . carvedilol (COREG) 25 MG tablet TAKE (1) TABLET TWICE A DAY WITH FOOD---BREAKFAST AND SUPPER. (Patient taking differently: Take 25 mg by mouth two times a day with food (BREAKFAST AND SUPPER)) 180 tablet 0  . Cholecalciferol (VITAMIN D3) 5000 units CAPS Take 5,000 Units by mouth daily.    . clopidogrel (PLAVIX) 75 MG tablet TAKE 1 TABLET BY MOUTH ONCE DAILY. (Patient taking differently: Take 75 mg by mouth once a day) 90 tablet 0  . diazepam (VALIUM) 5 MG tablet Take 5 mg by mouth every 6 (six) hours as needed for anxiety.    . furosemide (LASIX) 40 MG tablet Take 1 tablet (40 mg total) by mouth daily. 90 tablet 2  . HUMULIN R U-500 KWIKPEN 500 UNIT/ML kwikpen Inject 50-60 Units into the skin 3 (three) times daily with meals.     . hydrALAZINE (APRESOLINE) 50 MG tablet Take 1 tablet (50 mg total) by mouth 3 (three) times daily. 270 tablet 3  . HYDROcodone-acetaminophen (NORCO/VICODIN) 5-325 MG tablet Take 1 tablet by mouth every 6 (six) hours as needed for moderate pain.    . Insulin Pen Needle (B-D ULTRAFINE III SHORT PEN) 31G X 8 MM MISC 1 each by Does not apply route as directed. 150 each 3  . levocetirizine (XYZAL) 5 MG tablet Take 1 tablet (5 mg total) by mouth every evening. (Patient taking differently: Take 5 mg by mouth daily as needed (for seasonal allergies). ) 30 tablet 5  . Liraglutide (VICTOZA) 18 MG/3ML SOPN Inject 0.3 mLs (1.8 mg total) into the skin at bedtime. 27 pen 0  . nitroGLYCERIN (NITROSTAT) 0.4 MG SL tablet Place 1 tablet (0.4 mg total) under the tongue every 5 (five) minutes as needed for chest pain. 25 tablet 3  . omeprazole (PRILOSEC) 40 MG capsule Take 40 mg by mouth daily before breakfast.       . potassium chloride (K-DUR) 10 MEQ tablet Take 1 tablet (10 mEq total) by mouth daily. 90 tablet 2  . ranitidine (ZANTAC) 150 MG tablet Take 1 tablet (150 mg total) by mouth daily. (Patient taking differently: Take 150 mg by mouth 2 (two) times daily. ) 90 tablet 3  . traMADol (ULTRAM) 50 MG tablet Take 1 tablet (50 mg total) by mouth every 6 (six) hours as needed. (Patient taking differently: Take 50 mg by mouth every 6 (six) hours as needed (for pain). ) 56 tablet 0  . vortioxetine HBr (TRINTELLIX) 20 MG TABS Take 20 mg by mouth daily. 30 tablet 1   No current facility-administered medications for this visit.    Allergies:  Brilinta [ticagrelor]; Cinnamon; Fish-derived products; Other; Penicillins;  Strawberry extract; Amoxicillin-pot clavulanate; Crestor [rosuvastatin]; and Levaquin [levofloxacin]   Social History: The patient  reports that she has never smoked. She has never used smokeless tobacco. She reports that she does not drink alcohol or use drugs.   ROS:  Please see the history of present illness. Otherwise, complete review of systems is positive for recurrent cough and chest congestion.  All other systems are reviewed and negative.   Physical Exam: VS:  BP 140/74   Pulse 82   Ht 5\' 5"  (1.651 m)   Wt 218 lb (98.9 kg)   SpO2 92%   BMI 36.28 kg/m , BMI Body mass index is 36.28 kg/m.  Wt Readings from Last 3 Encounters:  05/27/17 218 lb (98.9 kg)  05/13/17 223 lb 6.4 oz (101.3 kg)  03/21/17 216 lb 0.8 oz (98 kg)    General: Overweight woman, no distress. HEENT: Conjunctiva and lids normal, oropharynx clear. Neck: Supple, no elevated JVP or carotid bruits, no thyromegaly. Lungs: Prolonged expiratory phase with wheezing and scattered rhonchi, nonlabored breathing at rest. Cardiac: Regular rate and rhythm, no S3, 2/6 basalsystolic murmur, no pericardial rub. Abdomen: Soft, nontender, bowel sounds present, no guarding or rebound. Extremities: No pitting edema, distal pulses  2+. Skin: Warm and dry. Musculoskeletal: No kyphosis. Neuropsychiatric: Alert and oriented x3, affect grossly appropriate.  ECG: I personally reviewed the tracing from 05/11/2009 which showed normal sinus rhythm with nonspecific ST-T changes.  Recent Labwork: 05/11/2017: ALT 23; AST 52; B Natriuretic Peptide 478.5; Magnesium 1.8; TSH 0.040 05/13/2017: BUN 46; Creatinine, Ser 2.10; Hemoglobin 11.0; Platelets 244; Potassium 4.8; Sodium 139     Component Value Date/Time   CHOL 154 06/11/2016 1007   TRIG 174 (H) 06/11/2016 1007   HDL 47 06/11/2016 1007   CHOLHDL 3.3 06/11/2016 1007   VLDL 35 (H) 06/11/2016 1007   LDLCALC 72 06/11/2016 1007    Other Studies Reviewed Today:  Cardiac catheterization 05/12/2017:  Patent proximal to mid RCA stent.  Ost LAD to Prox LAD lesion, 30 %stenosed.  Mid LAD to Dist LAD lesion, 60 %stenosed.  Ost 2nd Mrg to 2nd Mrg lesion, 90 %stenosed.  2nd Mrg lesion, 99 %stenosed. Diffusely diseased vessel.  Mid RCA lesion, 100 %stenosed. This is the culprit. There are left to right collaterals.  LV end diastolic pressure is moderately elevated.  There is no aortic valve stenosis.  Hemodynamic findings consistent with mild to moderate pulmonary hypertension.  CO 5.6 L/min. CI 2.7. PA sat 64 %. Unable to wedge the catheter.   Occluded RCA appears to be the cause of her positive troponin.  Her SHOB is from volume overload and her elevated LVEDP.  No chest pain in over 24 hours.  No benefit from revascularization at this point.    Continue diuresis with careful check of her renal function.  Aggressive secondary prevention.  Echocardiogram 05/13/2017: Study Conclusions  - Left ventricle: The cavity size was normal. Wall thickness was   increased in a pattern of moderate LVH. Systolic function was   vigorous. The estimated ejection fraction was in the range of 65%   to 70%. Wall motion was normal; there were no regional wall   motion abnormalities.  Doppler parameters are consistent with   pseudonormal left ventricular relaxation (grade 2 diastolic   dysfunction). The E/e&' ratio is >20, suggesting elevated LV   filling pressure. - Aortic valve: Poorly visualized. Mildly calcified leaflets. Mild   stenosis. Valve area (VTI): 1.63 cm^2. Valve area (Vmax): 1.71   cm^2.  Valve area (Vmean): 1.65 cm^2. - Left atrium: The atrium was normal in size. - Right ventricle: RVH is noted. Systolic function is normal - the   ventricle is not dilated. Lateral annulus peak S velocity: 11.3   cm/s. - Right atrium: The atrium was normal in size. - Pulmonic valve: Poorly visualized. Elevated gradient consistent   with mild stenosis. - Inferior vena cava: The vessel was normal in size. The   respirophasic diameter changes were in the normal range (>= 50%),   consistent with normal central venous pressure.  Impressions:  - Compared to a prior study in 01/2016, there are few changes. There   is mild aortic and pulmonic stenosis, RVH is noted. Grade 2 DD   with elevated LV filling pressure is present.  Assessment and Plan:  1. Recurrent cough, chest congestion, and wheezing. She did have diastolic heart failure during hospitalization in June, but chronic lung disease is also suspected, and her weight is down with diuresis. Plan is to obtain a PA and lateral chest x-ray, provide Medrol Dosepak, and schedule consultation for pulmonary assessment with Dr. Juanetta GoslingHawkins.  2. Multivessel CAD as outlined above. She is being managed medically based on coronary anatomy at recent cardiac catheterization. She does not report active angina symptoms at this time. Continue dual antiplatelet therapy with recent NSTEMI.  3. CKD stage III, last creatinine 2.1. She follows with Nephrology.  4. Mild aortic stenosis, asymptomatic.  5. Hyperlipidemia on Lipitor.  Current medicines were reviewed with the patient today.   Orders Placed This Encounter  Procedures  . DG  Chest 2 View  . Ambulatory referral to Pulmonology    Disposition: Follow-up in 3 months.  Signed, Jonelle SidleSamuel G. Lars Jeziorski, MD, [facility] RehabilitationFACC 05/27/2017 10:02 AM    [facility] [address], [place], [place] [address] Phone: [phone]; Fax: [phone]

## 2017-05-27 ENCOUNTER — Ambulatory Visit (HOSPITAL_COMMUNITY)
Admission: RE | Admit: 2017-05-27 | Discharge: 2017-05-27 | Disposition: A | Discharge status: Home / Self Care | Payer: Medicare Other | Source: Ambulatory Visit | Attending: Cardiology | Admitting: Cardiology

## 2017-05-27 ENCOUNTER — Encounter: Payer: Self-pay | Admitting: Cardiology

## 2017-05-27 ENCOUNTER — Ambulatory Visit (INDEPENDENT_AMBULATORY_CARE_PROVIDER_SITE_OTHER): Payer: Medicare Other | Admitting: Cardiology

## 2017-05-27 VITALS — BP 140/74 | HR 82 | Ht 65.0 in | Wt 218.0 lb

## 2017-05-27 DIAGNOSIS — R059 Cough, unspecified: Secondary | ICD-10-CM

## 2017-05-27 DIAGNOSIS — J42 Unspecified chronic bronchitis: Secondary | ICD-10-CM | POA: Diagnosis not present

## 2017-05-27 DIAGNOSIS — R05 Cough: Secondary | ICD-10-CM | POA: Diagnosis not present

## 2017-05-27 DIAGNOSIS — I5032 Chronic diastolic (congestive) heart failure: Secondary | ICD-10-CM

## 2017-05-27 DIAGNOSIS — I214 Non-ST elevation (NSTEMI) myocardial infarction: Secondary | ICD-10-CM

## 2017-05-27 DIAGNOSIS — N183 Chronic kidney disease, stage 3 unspecified: Secondary | ICD-10-CM

## 2017-05-27 DIAGNOSIS — I25119 Atherosclerotic heart disease of native coronary artery with unspecified angina pectoris: Secondary | ICD-10-CM | POA: Diagnosis not present

## 2017-05-27 DIAGNOSIS — I517 Cardiomegaly: Secondary | ICD-10-CM | POA: Diagnosis not present

## 2017-05-27 DIAGNOSIS — R062 Wheezing: Secondary | ICD-10-CM

## 2017-05-27 DIAGNOSIS — E782 Mixed hyperlipidemia: Secondary | ICD-10-CM | POA: Diagnosis not present

## 2017-05-27 MED ORDER — FUROSEMIDE 40 MG PO TABS: 40.0000 mg | ORAL_TABLET | Freq: Every day | ORAL | 2 refills | Status: DC

## 2017-05-27 MED ORDER — POTASSIUM CHLORIDE ER 10 MEQ PO TBCR: 10.0000 meq | EXTENDED_RELEASE_TABLET | Freq: Every day | ORAL | 2 refills | Status: DC

## 2017-05-27 MED ORDER — METHYLPREDNISOLONE 4 MG PO TBPK: ORAL_TABLET | ORAL | 0 refills | Status: DC

## 2017-05-27 NOTE — Patient Instructions (Addendum)
Your physician recommends that you schedule a follow-up appointment in:  3 months with Dr Diona BrownerMcDowell   Get chest x-ray today   You have been referred to Dr Eric FormEdward Hawkins,Pulmonologist   [phone], they will call you for an apt.    Take Medrol dose pack as directed    I refilled your lasix and potassium    Your physician recommends that you continue on your current medications as directed. Please refer to the Current Medication list given to you today.      Thank you for choosing [facility] !
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
## 2017-06-17 ENCOUNTER — Ambulatory Visit: Payer: Medicare Other | Admitting: Cardiology

## 2017-06-20 ENCOUNTER — Telehealth (HOSPITAL_COMMUNITY): Payer: Self-pay | Admitting: *Deleted

## 2017-06-20 ENCOUNTER — Ambulatory Visit (HOSPITAL_COMMUNITY): Payer: Medicare Other | Admitting: Psychiatry

## 2017-06-20 NOTE — Telephone Encounter (Signed)
Returned phone call to patient, left voice message.  she left VOICE MESSAGE THIS A.M. , SAID SHE JUST GOT OUT OF THE HOSPITAL YESTERDAY.
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
## 2017-06-22 IMAGING — CR DG CHEST 1V PORT
1 series · 1 of 1 positions shown · non-contrast
Comparison: 01/28/2012

CLINICAL DATA: Chest pain

EXAM:
PORTABLE CHEST 1 VIEW

[ap]
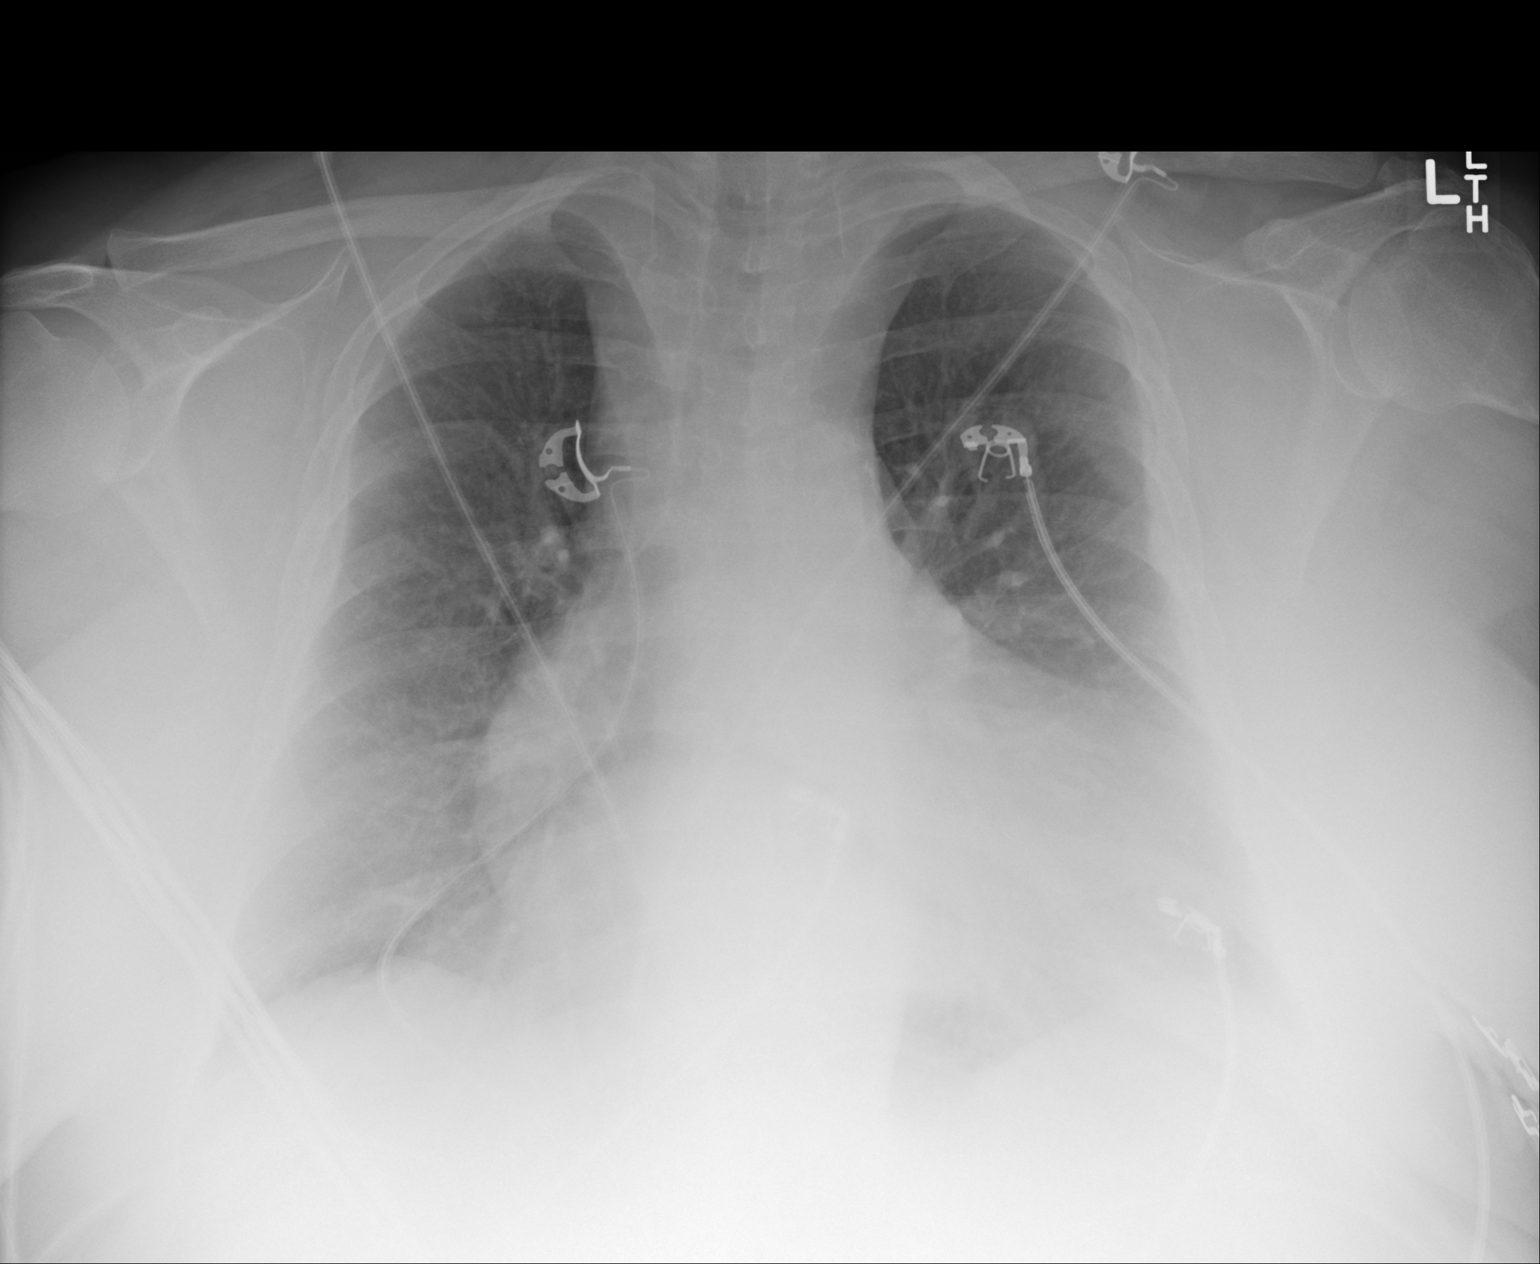

[1 of 1 positions shown; findings below may reference images not displayed]

FINDINGS: Cardiopericardial enlargement which is increased from prior. There
is no edema, consolidation, effusion, or pneumothorax. No acute
osseous finding.
IMPRESSION: 1. Cardiopericardial enlargement which has increased from 2973.
Pericardial effusion is possible.
2. No edema or pneumonia.

## 2017-06-23 NOTE — Progress Notes (Signed)
Cardiology Office Note   Date:  06/24/2017   ID:  KATHALINA OSTERMANN, DOB 1951/08/18, MRN [id number]  PCP:  Phyllis Ginger  Cardiologist:   Chief Complaint  Patient presents with  . Coronary Artery Disease  . Shortness of Breath      History of Present Illness: Teresa Hart is a 66 y.o. female who presents for ongoing assessment and management of coronary artery disease, with recent hospitalization in June, with cardiac catheterization revealing patent right coronary artery stents, nonobstructive  In the LAD, diffusely diseased OM, and culprit lesion felt to be mid RCA which was occluded, but did right total left or right collaterals. She was last seen in the office by Dr. Diona Browner on 05/27/2017 complaining of recurrent cough and wheezing.  During that office visit AP and lateral chest x-ray was ordered, and Medrol Dosepak was provided and she was referred to pulmonology with Dr. Juanetta Gosling. She was continued on her other medication regimen without changes.  Since being seen last, she has been hospitalized 2 times. She has gone to [facility]. She was admitted with NSTEMI in [place] on July 17, and again on August 2nd for CHF. During first hospitalization she had a repeat of her cardiac cath with both LHC and RHCdue to rising troponin levels and worsening dyspnea.   Cardiac cath; Dated 06/03/2017 performed by Dr. Laural Golden (please see scanned report, but transcribed from document from [facility])  Impression: 1. Diffuse and severe three-vessel coronary artery disease was unfortunately poor targets, in particular poor target and a very small distal LAD and diffuse severe disease in the left circumflex artery, an occluded PDA with collaterals from the left. Overall description is very similar to that that was obtained at [facility] last month. The findings do not explain the patient's recent event. This was supported by a remarkable elevation of cardiac troponin  up to 1.81 with flash pulmonary edema.  2. Biventricular heart failure: Despite receiving diuresis and marked clinical improvement, the patient still has elevation of filling pressures both of the right and left ventricle in addition with pronounced retro-phasic variation of both right ventriclular and left ventricular pressures likely due to associated underlying chronic obstructive pulmonary disease/morbid obesity with pickwickian physiology.  3. Management at this point besides diuresis medications for secondary prevention of coronary artery disease and biventricular heart failure will be adjusted. There currently does not appear to be room for either surgical or percutaneous revascularization.   Echocardiogram 06/02/2017 (transcribed from document received from [facility]) Left ventricular size is normal with mild concentric left ventricular hypertrophy and basal inferior akinesis, already demonstrated on 05/29/2017 study. There is also more pronounced distal septal apical hypokinesis and ejection fraction is visually estimated currently at 45% to 50% measured by biplane Simpson at 48%. Left ventricular diastolic filling pattern at the time of the study, which follows vigorous intravenous diuresis is actually grade 1 indicating no significant elevation of left ventricular filling pressure.   Summary: 1. Ischemic cardiomyopathy with prior event in the right coronary artery distribution and currently worse and wall motion in the LAD distribution, which may represent an acute event.  2. Doppler analysis of left ventricular filling pressure at the time of the study indicates no significant elevation, however, the study was performed following significant improvement of the patient's pulmonary edema with intravenous diuresis.   Since being discharged on 06/19/2017 medications have been changed, this includes discontinuation of carvedilol and changed to metoprolol 50 mg twice a day.  Hydralazine was  added at 50 mg twice a day and lisinopril was discontinued due to hyperkalemia. The patient was not placed on potassium replacement despite use of furosemide 40 mg daily.   She has been placed on home O2 at 2 L since being discharged, and is due to see Dr. Juanetta Gosling in one week.  She states she still feels tired but is slowly improving. She is trying to become more active although this is difficult for her due to her breathing status. She is weighing daily with weight remaining essentially the same since discharge ranged between 217-218 pounds.  Past Medical History:  Diagnosis Date  . Aortic stenosis, mild    a. per echo Jan 2013; EF is 61%  . Asthma   . CAD (coronary artery disease)    a. 11/2011 abnl MV; b. 11/2011 Cath: No obstructive disease in the large caliber vessels, disease in the small OM2 and distal LAD, not amenable to PCI or grafting; c. 01/2016 PCI: LM nl, LAD 30ost, 82m, LCX nl, OM1 small, OM2 90 diff, OM3 small, RCA  95p/m 2.75 x 24 Promus Premier DES, 35m. Severe OM2 managed medically.  . Cardiac enlargement   . Carotid stenosis, bilateral    a. per doppler Jan 2013; 60-79% RICA, 40-59% LICA  . CKD (chronic kidney disease), stage III   . GERD (gastroesophageal reflux disease)   . Hyperlipidemia   . Hypertensive heart disease   . Hypothyroid   . Obese   . Type I diabetes mellitus (HCC)     Past Surgical History:  Procedure Laterality Date  . ABDOMINAL HYSTERECTOMY    . APPENDECTOMY    . CARDIAC CATHETERIZATION  Jan 2013   No major obstructive disease in the large caliber vessels, with disease in a small OM2 and distal LAD; not amenable to PCI or grafting.   Marland Kitchen CARDIAC CATHETERIZATION N/A 02/05/2016   Procedure: Left Heart Cath and Coronary Angiography;  Surgeon: Peter M Swaziland, MD;  Location: [facility] INVASIVE CV LAB;  Service: Cardiovascular;  Laterality: N/A;  . CARDIAC CATHETERIZATION  02/05/2016   Procedure: Coronary Stent Intervention;  Surgeon: Peter M Swaziland, MD;   Location: [facility] INVASIVE CV LAB;  Service: Cardiovascular;;  . CARDIAC CATHETERIZATION N/A 02/20/2016   Procedure: Coronary Stent Intervention;  Surgeon: Lyn Records, MD;  Location: [facility] INVASIVE CV LAB;  Service: Cardiovascular;  Laterality: N/A;  . CATARACT EXTRACTION  08/2016  . KNEE ARTHROSCOPY  06/24/2012   Procedure: ARTHROSCOPY KNEE;  Surgeon: Nestor Lewandowsky, MD;  Location: [facility];  Service: Orthopedics;  Laterality: Right;  DEBRIDEMENT OF CHONDROMALACIA  . KNEE SURGERY    . RIGHT/LEFT HEART CATH AND CORONARY ANGIOGRAPHY N/A 05/12/2017   Procedure: Right/Left Heart Cath and Coronary Angiography;  Surgeon: Corky Crafts, MD;  Location: [facility] INVASIVE CV LAB;  Service: Cardiovascular;  Laterality: N/A;  . TONSILLECTOMY       Current Outpatient Prescriptions  Medication Sig Dispense Refill  . ACCU-CHEK AVIVA PLUS test strip CHECK BLOOD SUGAR 4 TIMES DAILY. 450 each 2  . albuterol (PROAIR HFA) 108 (90 Base) MCG/ACT inhaler Inhale 2 puffs into the lungs every 4 (four) hours as needed for wheezing or shortness of breath.    Marland Kitchen albuterol (PROVENTIL HFA;VENTOLIN HFA) 108 (90 BASE) MCG/ACT inhaler Inhale 2 puffs into the lungs every 6 (six) hours as needed. 18 g 2  . ARIPiprazole (ABILIFY) 20 MG tablet Take 0.5 tablets (10 mg total) by mouth every evening. 90 tablet 0  .  aspirin EC 81 MG tablet Take 1 tablet (81 mg total) by mouth daily. 150 tablet 2  . atorvastatin (LIPITOR) 80 MG tablet TAKE 1 TABLET BY MOUTH AT BEDTIME FOR CHOLESTEROL 90 tablet 0  . Cholecalciferol (VITAMIN D3) 5000 units CAPS Take 5,000 Units by mouth daily.    . clopidogrel (PLAVIX) 75 MG tablet TAKE 1 TABLET BY MOUTH ONCE DAILY. (Patient taking differently: Take 75 mg by mouth once a day) 90 tablet 0  . diazepam (VALIUM) 5 MG tablet Take 5 mg by mouth every 6 (six) hours as needed for anxiety.    . diazepam (VALIUM) 5 MG tablet Take 1 tablet (5 mg total) by mouth daily as needed for anxiety. 30 tablet 1  .  furosemide (LASIX) 40 MG tablet Take 1 tablet (40 mg total) by mouth daily. 90 tablet 2  . HUMALOG KWIKPEN 100 UNIT/ML KiwkPen Sliding Scale    . hydrALAZINE (APRESOLINE) 50 MG tablet Take 1 tablet (50 mg total) by mouth 3 (three) times daily. (Patient taking differently: Take 50 mg by mouth 2 (two) times daily. ) 270 tablet 3  . HYDROcodone-acetaminophen (NORCO/VICODIN) 5-325 MG tablet Take 1 tablet by mouth every 6 (six) hours as needed for moderate pain.    . Insulin Pen Needle (B-D ULTRAFINE III SHORT PEN) 31G X 8 MM MISC 1 each by Does not apply route as directed. 150 each 3  . levocetirizine (XYZAL) 5 MG tablet Take 1 tablet (5 mg total) by mouth every evening. (Patient taking differently: Take 5 mg by mouth daily. ) 30 tablet 5  . Liraglutide (VICTOZA) 18 MG/3ML SOPN Inject 0.3 mLs (1.8 mg total) into the skin at bedtime. 27 pen 0  . metoprolol tartrate (LOPRESSOR) 50 MG tablet Take 50 mg by mouth 2 (two) times daily.    . nitroGLYCERIN (NITROSTAT) 0.4 MG SL tablet Place 1 tablet (0.4 mg total) under the tongue every 5 (five) minutes as needed for chest pain. 25 tablet 3  . omeprazole (PRILOSEC) 40 MG capsule Take 40 mg by mouth daily before breakfast.     . OXYGEN Inhale 3 mLs into the lungs.    . ranitidine (ZANTAC) 150 MG tablet Take 1 tablet (150 mg total) by mouth daily. (Patient taking differently: Take 150 mg by mouth 2 (two) times daily. ) 90 tablet 3  . vortioxetine HBr (TRINTELLIX) 20 MG TABS Take 20 mg by mouth daily. 90 tablet 0   No current facility-administered medications for this visit.     Allergies:   Brilinta [ticagrelor]; Cinnamon; Fish-derived products; Other; Penicillins; Strawberry extract; Amoxicillin-pot clavulanate; Crestor [rosuvastatin]; and Levaquin [levofloxacin]    Social History:  The patient  reports that she has never smoked. She has never used smokeless tobacco. She reports that she does not drink alcohol or use drugs.   Family History:  The patient's  family history includes Coronary artery disease in her brother and brother; Heart disease in her brother.    ROS: All other systems are reviewed and negative. Unless otherwise mentioned in H&P    PHYSICAL EXAM: VS:  Ht 5\' 5"  (1.651 m)   Wt 218 lb 4.8 oz (99 kg)   BMI 36.33 kg/m  , BMI Body mass index is 36.33 kg/m. GEN: Well nourished, well developed, in no acute distress  HEENT: normal  Neck: no JVD, carotid bruits, or masses Cardiac: RRR; no murmurs, rubs, or gallops,no edema  Respiratory:  Crackles noted in the left lung, cleared with coughing. Remains in O2  via nasal cannula.  GI: soft, nontender, nondistended, + BS MS: no deformity or atrophy  Skin: warm and dry, no rash Neuro:  Strength and sensation are intact Psych: euthymic mood, full affect   Recent Labs: 05/11/2017: ALT 23; B Natriuretic Peptide 478.5; Magnesium 1.8; TSH 0.040 2017-06-05: BUN 46; Creatinine, Ser 2.10; Hemoglobin 11.0; Platelets 244; Potassium 4.8; Sodium 139    Lipid Panel    Component Value Date/Time   CHOL 154 06/11/2016 1007   TRIG 174 (H) 06/11/2016 1007   HDL 47 06/11/2016 1007   CHOLHDL 3.3 06/11/2016 1007   VLDL 35 (H) 06/11/2016 1007   LDLCALC 72 06/11/2016 1007      Wt Readings from Last 3 Encounters:  06/24/17 218 lb 4.8 oz (99 kg)  05/27/17 218 lb (98.9 kg)  06/05/2017 223 lb 6.4 oz (101.3 kg)      Other studies Reviewed: Echocardiogram 06-05-2017 Left ventricle: The cavity size was normal. Wall thickness was   increased in a pattern of moderate LVH. Systolic function was   vigorous. The estimated ejection fraction was in the range of 65%   to 70%. Wall motion was normal; there were no regional wall   motion abnormalities. Doppler parameters are consistent with   pseudonormal left ventricular relaxation (grade 2 diastolic   dysfunction). The E/e&' ratio is >20, suggesting elevated LV   filling pressure. - Aortic valve: Poorly visualized. Mildly calcified leaflets. Mild    stenosis. Valve area (VTI): 1.63 cm^2. Valve area (Vmax): 1.71   cm^2. Valve area (Vmean): 1.65 cm^2. - Left atrium: The atrium was normal in size. - Right ventricle: RVH is noted. Systolic function is normal - the   ventricle is not dilated. Lateral annulus peak S velocity: 11.3   cm/s. - Right atrium: The atrium was normal in size. - Pulmonic valve: Poorly visualized. Elevated gradient consistent   with mild stenosis. - Inferior vena cava: The vessel was normal in size. The   respirophasic diameter changes were in the normal range (>= 50%),   consistent with normal central venous pressure.  Cardiac Catheterization 05/12/2017 Conclusion     Patent proximal to mid RCA stent.  Ost LAD to Prox LAD lesion, 30 %stenosed.  Mid LAD to Dist LAD lesion, 60 %stenosed.  Ost 2nd Mrg to 2nd Mrg lesion, 90 %stenosed.  2nd Mrg lesion, 99 %stenosed. Diffusely diseased vessel.  Mid RCA lesion, 100 %stenosed. This is the culprit. There are left to right collaterals.  LV end diastolic pressure is moderately elevated.  There is no aortic valve stenosis.  Hemodynamic findings consistent with mild to moderate pulmonary hypertension.  CO 5.6 L/min. CI 2.7. PA sat 64 %. Unable to wedge the catheter.   Occluded RCA appears to be the cause of her positive troponin.  Her SHOB is from volume overload and her elevated LVEDP.  No chest pain in over 24 hours.  No benefit from revascularization at this point.      ASSESSMENT AND PLAN:  1. Coronary artery disease: Patient with severe coronary artery disease not amenable to percutaneous intervention or surgical intervention based upon 2 previous catheterizations within the last month. One at [facility], one in [facility]. Please see above dictation of both procedures. (These have also been scanned). Currently the patient is on beta blocker, metoprolol 50 mg twice a day, continues on Plavix 75 mg daily, and statin therapy with atorvastatin  80 mg daily.  With continue secondary prevention. May need to consider referral to advanced  heart failure clinic for ongoing management, question need for ICD pacemaker for prophylaxis for VT.Marland Kitchen. The patient has no complaints of rapid heart rhythm dizziness or palpitations at this time.  2. Oxygen dependent COPD: She is due to be seen by pulmonology next week, Dr. Juanetta GoslingHawkins, for management of oxygen and lung disease.  3. Hypertension: She has been taken off of lisinopril in the setting of hyperkalemia, remains on hydralazine 50 mg twice a day with blood pressure well controlled. She states she takes her blood pressure at home as well and it matches the blood pressure reading we are having in her office. We'll continue to monitor.  4. Diabetes: Followed by PCP, Dr. Debbe BalesBacum, [facility].     Current medicines are reviewed at length with the patient today.  Will follow up in one month. Continue daily weight and salt restriction.   Labs/ tests ordered today include: BMET, PA and lateral chest x-ray. I have spent over 35 minutes with this patient and her husband going over records from 2 hospitalizations in [place] for CHF and non-ST elevation MI with review of cardiac catheterization echocardiogram and treatment.   Bettey MareKathryn M. Liborio NixonLawrence DNP, ANP, AACC   06/24/2017 3:45 PM    [facility] [address], [place], [place] [address] Phone: [phone]; Fax: [phone]

## 2017-06-24 ENCOUNTER — Ambulatory Visit (INDEPENDENT_AMBULATORY_CARE_PROVIDER_SITE_OTHER): Payer: Medicare Other | Admitting: Psychiatry

## 2017-06-24 ENCOUNTER — Encounter: Payer: Self-pay | Admitting: Adult Health

## 2017-06-24 ENCOUNTER — Encounter (HOSPITAL_COMMUNITY): Payer: Self-pay | Admitting: Psychiatry

## 2017-06-24 ENCOUNTER — Ambulatory Visit (HOSPITAL_COMMUNITY)
Admission: RE | Admit: 2017-06-24 | Discharge: 2017-06-24 | Disposition: A | Discharge status: Home / Self Care | Payer: Medicare Other | Source: Ambulatory Visit | Attending: Adult Health | Admitting: Adult Health

## 2017-06-24 ENCOUNTER — Ambulatory Visit (INDEPENDENT_AMBULATORY_CARE_PROVIDER_SITE_OTHER): Payer: Medicare Other | Admitting: Adult Health

## 2017-06-24 VITALS — BP 118/62 | HR 61 | Ht 65.0 in | Wt 218.3 lb

## 2017-06-24 VITALS — BP 111/41 | HR 58 | Ht 65.0 in | Wt 218.0 lb

## 2017-06-24 DIAGNOSIS — I5031 Acute diastolic (congestive) heart failure: Secondary | ICD-10-CM | POA: Diagnosis present

## 2017-06-24 DIAGNOSIS — I10 Essential (primary) hypertension: Secondary | ICD-10-CM | POA: Diagnosis not present

## 2017-06-24 DIAGNOSIS — F3342 Major depressive disorder, recurrent, in full remission: Secondary | ICD-10-CM

## 2017-06-24 DIAGNOSIS — I43 Cardiomyopathy in diseases classified elsewhere: Secondary | ICD-10-CM | POA: Diagnosis not present

## 2017-06-24 DIAGNOSIS — I2510 Atherosclerotic heart disease of native coronary artery without angina pectoris: Secondary | ICD-10-CM | POA: Diagnosis not present

## 2017-06-24 DIAGNOSIS — J430 Unilateral pulmonary emphysema [MacLeod's syndrome]: Secondary | ICD-10-CM | POA: Diagnosis not present

## 2017-06-24 DIAGNOSIS — F419 Anxiety disorder, unspecified: Secondary | ICD-10-CM

## 2017-06-24 MED ORDER — VORTIOXETINE HBR 20 MG PO TABS: 20.0000 mg | ORAL_TABLET | Freq: Every day | ORAL | 0 refills | Status: DC

## 2017-06-24 MED ORDER — DIAZEPAM 5 MG PO TABS: 5.0000 mg | ORAL_TABLET | Freq: Every day | ORAL | 1 refills | Status: DC | PRN

## 2017-06-24 MED ORDER — CLOPIDOGREL BISULFATE 75 MG PO TABS: ORAL_TABLET | ORAL | 3 refills | Status: DC

## 2017-06-24 MED ORDER — ARIPIPRAZOLE 20 MG PO TABS: 10.0000 mg | ORAL_TABLET | Freq: Every evening | ORAL | 0 refills | Status: DC

## 2017-06-24 MED ORDER — METOPROLOL TARTRATE 50 MG PO TABS: 50.0000 mg | ORAL_TABLET | Freq: Two times a day (BID) | ORAL | 6 refills | Status: DC

## 2017-06-24 MED ORDER — FUROSEMIDE 40 MG PO TABS: 40.0000 mg | ORAL_TABLET | Freq: Every day | ORAL | 2 refills | Status: DC

## 2017-06-24 NOTE — Patient Instructions (Signed)
Medication Instructions:  Your physician recommends that you continue on your current medications as directed. Please refer to the Current Medication list given to you today.   Labwork: Your physician recommends that you return for lab work in: Next week    Testing/Procedures: Chest X-Ray Today   Follow-Up: Your physician recommends that you schedule a follow-up appointment in: September with Joni ReiningKathryn Lawrence, DNP   Any Other Special Instructions Will Be Listed Below (If Applicable).     If you need a refill on your cardiac medications before your next appointment, please call your pharmacy.

## 2017-06-24 NOTE — Patient Instructions (Signed)
1. Continue Trintellix 20 mg daily 2. Continue Abilify 10 mg daily  3. Continue Vailum 5 mg daily as needed for anxiety 4. Return to clinic in three months

## 2017-06-26 ENCOUNTER — Telehealth: Payer: Self-pay

## 2017-06-26 DIAGNOSIS — Z79899 Other long term (current) drug therapy: Secondary | ICD-10-CM

## 2017-06-26 MED ORDER — FUROSEMIDE 40 MG PO TABS: 40.0000 mg | ORAL_TABLET | Freq: Two times a day (BID) | ORAL | 2 refills | Status: DC

## 2017-06-26 NOTE — Telephone Encounter (Signed)
Pt made aware, she voiced understanding.  

## 2017-06-26 NOTE — Telephone Encounter (Signed)
-----   Message from Jodelle GrossKathryn M Lawrence, NP sent at 06/26/2017  8:12 AM EDT ----- Evidence of CHF on CXR. Have her go up on lasix to 40 mg BID. Repeat BMET on Monday. Continue to weigh daily.  She may not have lower dry weight. Will need to follow closely.

## 2017-07-01 ENCOUNTER — Other Ambulatory Visit: Payer: Self-pay | Admitting: Physician Assistant

## 2017-07-01 DIAGNOSIS — Z1231 Encounter for screening mammogram for malignant neoplasm of breast: Secondary | ICD-10-CM

## 2017-07-15 ENCOUNTER — Other Ambulatory Visit (HOSPITAL_COMMUNITY): Payer: Self-pay | Admitting: Respiratory Therapy

## 2017-07-15 DIAGNOSIS — R0602 Shortness of breath: Secondary | ICD-10-CM

## 2017-07-17 ENCOUNTER — Other Ambulatory Visit: Payer: Self-pay | Admitting: Adult Health

## 2017-07-25 ENCOUNTER — Ambulatory Visit (INDEPENDENT_AMBULATORY_CARE_PROVIDER_SITE_OTHER): Payer: Medicare Other | Admitting: Adult Health

## 2017-07-25 ENCOUNTER — Ambulatory Visit (HOSPITAL_COMMUNITY)
Admission: RE | Admit: 2017-07-25 | Discharge: 2017-07-25 | Disposition: A | Discharge status: Home / Self Care | Payer: Medicare Other | Source: Ambulatory Visit | Attending: Pulmonary Disease | Admitting: Pulmonary Disease

## 2017-07-25 ENCOUNTER — Encounter: Payer: Self-pay | Admitting: Adult Health

## 2017-07-25 VITALS — BP 118/58 | HR 85 | Ht 65.0 in | Wt 210.0 lb

## 2017-07-25 DIAGNOSIS — I2510 Atherosclerotic heart disease of native coronary artery without angina pectoris: Secondary | ICD-10-CM | POA: Diagnosis not present

## 2017-07-25 DIAGNOSIS — R942 Abnormal results of pulmonary function studies: Secondary | ICD-10-CM | POA: Diagnosis not present

## 2017-07-25 DIAGNOSIS — I5032 Chronic diastolic (congestive) heart failure: Secondary | ICD-10-CM

## 2017-07-25 DIAGNOSIS — R0602 Shortness of breath: Secondary | ICD-10-CM | POA: Insufficient documentation

## 2017-07-25 DIAGNOSIS — I10 Essential (primary) hypertension: Secondary | ICD-10-CM

## 2017-07-25 DIAGNOSIS — E7800 Pure hypercholesterolemia, unspecified: Secondary | ICD-10-CM

## 2017-07-25 MED ORDER — ALBUTEROL SULFATE (2.5 MG/3ML) 0.083% IN NEBU
2.5000 mg | INHALATION_SOLUTION | Freq: Once | RESPIRATORY_TRACT | Status: AC
  Administered 2017-07-25: 2.5 mg via RESPIRATORY_TRACT

## 2017-07-25 MED ORDER — METOPROLOL SUCCINATE ER 50 MG PO TB24: 50.0000 mg | ORAL_TABLET | Freq: Every day | ORAL | 6 refills | Status: DC

## 2017-07-25 NOTE — Patient Instructions (Signed)
Medication Instructions:  Your physician has recommended you make the following change in your medication: Stop Taking Lopressor  Start Taking Toprol XL 50 mg Daily at Night    Labwork: NONE  Testing/Procedures: NONE  Follow-Up: Your physician wants you to follow-up in: 6 Months with Dr. Diona BrownerMcDowell. You will receive a reminder letter in the mail two months in advance. If you don't receive a letter, please call our office to schedule the follow-up appointment.   Any Other Special Instructions Will Be Listed Below (If Applicable).     If you need a refill on your cardiac medications before your next appointment, please call your pharmacy.  Thank you for choosing [facility]!

## 2017-07-25 NOTE — Progress Notes (Signed)
Cardiology Office Note   Date:  07/25/2017   ID:  Teresa Hart, DOB 05/09/1951, MRN [id number]  PCP:  Abran RichardBaucom, Jenny B, PA-C  Cardiologist:  Diona BrownerMcDowell Chief Complaint  Patient presents with  . Congestive Heart Failure  . Shortness of Breath      History of Present Illness: Teresa Hart is a 66 y.o. female who presents for  ongoing assessment and management of coronary artery disease, most recent cardiac catheterization in June 2018 revealing patent right coronary artery stents, nonobstructive LAD, with diffuse disease OM. The patient had complaint of chest pain and was found at the culprit lesion was mid RCA which was occluded but did have right to left collaterals.  The patient was last seen in our office on 06/25/2007. She was seen on follow-up after being admitted twice to the hospital since last office visit with Dr. Diona BrownerMcDowell. She had repeat cardiac catheter[place] in [place], revealing diffuse and severe three-vessel coronary artery disease with port targets. The patient was treated medically. Carvedilol was discontinued and she was changed to metoprolol 50 mg twice a day, continued on hydralazine, and furosemide. She continued to weigh daily with average weight between 217 pounds and 218 pounds. She is here for close follow-up after frequent hospitalizations.  BMET and a chest x-ray was ordered chest x-ray revealed mild congestive heart failure with small bilateral pleural effusions. She was advised to increase her Lasix to 40 mg twice a day. She was to have a follow-up BMET labs are pending.   She is followed up with pulmonology and has had a PFT test this morning. Labs were completed by primary care but we do not have copies. Her main complaint is fatigue.  Past Medical History:  Diagnosis Date  . Aortic stenosis, mild    a. per echo Jan 2013; EF is 61%  . Asthma   . CAD (coronary artery disease)    a. 11/2011 abnl MV; b. 11/2011 Cath: No obstructive disease in the large  caliber vessels, disease in the small OM2 and distal LAD, not amenable to PCI or grafting; c. 01/2016 PCI: LM nl, LAD 30ost, 4424m, LCX nl, OM1 small, OM2 90 diff, OM3 small, RCA  95p/m 2.75 x 24 Promus Premier DES, 236m. Severe OM2 managed medically.  . Cardiac enlargement   . Carotid stenosis, bilateral    a. per doppler Jan 2013; 60-79% RICA, 40-59% LICA  . CKD (chronic kidney disease), stage III   . GERD (gastroesophageal reflux disease)   . Hyperlipidemia   . Hypertensive heart disease   . Hypothyroid   . Obese   . Type I diabetes mellitus (HCC)     Past Surgical History:  Procedure Laterality Date  . ABDOMINAL HYSTERECTOMY    . APPENDECTOMY    . CARDIAC CATHETERIZATION  Jan 2013   No major obstructive disease in the large caliber vessels, with disease in a small OM2 and distal LAD; not amenable to PCI or grafting.   Marland Kitchen. CARDIAC CATHETERIZATION N/A 02/05/2016   Procedure: Left Heart Cath and Coronary Angiography;  Surgeon: Peter M SwazilandJordan, MD;  [facility];  Service: Cardiovascular;  Laterality: N/A;  . CARDIAC CATHETERIZATION  02/05/2016   Procedure: Coronary Stent Intervention;  Surgeon: Peter M SwazilandJordan, MD;  [facility];  Service: Cardiovascular;;  . CARDIAC CATHETERIZATION N/A 02/20/2016   Procedure: Coronary Stent Intervention;  Surgeon: Lyn RecordsHenry W Smith, MD;  [facility];  Service: Cardiovascular;  Laterality: N/A;  .  CATARACT EXTRACTION  08/2016  . KNEE ARTHROSCOPY  06/24/2012   Procedure: ARTHROSCOPY KNEE;  Surgeon: Nestor Lewandowsky, MD;  Location: [facility];  Service: Orthopedics;  Laterality: Right;  DEBRIDEMENT OF CHONDROMALACIA  . KNEE SURGERY    . RIGHT/LEFT HEART CATH AND CORONARY ANGIOGRAPHY N/A 05/12/2017   Procedure: Right/Left Heart Cath and Coronary Angiography;  Surgeon: Corky Crafts, MD;  Location: [facility] INVASIVE CV LAB;  Service: Cardiovascular;  Laterality: N/A;  . TONSILLECTOMY       Current Outpatient  Prescriptions  Medication Sig Dispense Refill  . ACCU-CHEK AVIVA PLUS test strip CHECK BLOOD SUGAR 4 TIMES DAILY. 450 each 2  . albuterol (PROVENTIL HFA;VENTOLIN HFA) 108 (90 BASE) MCG/ACT inhaler Inhale 2 puffs into the lungs every 6 (six) hours as needed. 18 g 2  . ARIPiprazole (ABILIFY) 20 MG tablet Take 0.5 tablets (10 mg total) by mouth every evening. 90 tablet 0  . aspirin EC 81 MG tablet Take 1 tablet (81 mg total) by mouth daily. 150 tablet 2  . atorvastatin (LIPITOR) 80 MG tablet TAKE 1 TABLET BY MOUTH AT BEDTIME FOR CHOLESTEROL 90 tablet 1  . Cholecalciferol (VITAMIN D3) 5000 units CAPS Take 5,000 Units by mouth daily.    . clopidogrel (PLAVIX) 75 MG tablet Take 75 mg by mouth once a day 90 tablet 3  . diazepam (VALIUM) 5 MG tablet Take 5 mg by mouth every 6 (six) hours as needed for anxiety.    . furosemide (LASIX) 40 MG tablet Take 40 mg by mouth daily.    . hydrALAZINE (APRESOLINE) 50 MG tablet Take 50 mg by mouth 2 (two) times daily.    Marland Kitchen HYDROcodone-acetaminophen (NORCO/VICODIN) 5-325 MG tablet Take 1 tablet by mouth every 6 (six) hours as needed for moderate pain.    Marland Kitchen insulin lispro (HUMALOG) 100 UNIT/ML injection Inject into the skin 3 (three) times daily before meals.    . Insulin Pen Needle (B-D ULTRAFINE III SHORT PEN) 31G X 8 MM MISC 1 each by Does not apply route as directed. 150 each 3  . levocetirizine (XYZAL) 5 MG tablet Take 1 tablet (5 mg total) by mouth every evening. (Patient taking differently: Take 5 mg by mouth daily. ) 30 tablet 5  . Liraglutide (VICTOZA) 18 MG/3ML SOPN Inject 0.3 mLs (1.8 mg total) into the skin at bedtime. 27 pen 0  . metoprolol tartrate (LOPRESSOR) 50 MG tablet Take 1 tablet (50 mg total) by mouth 2 (two) times daily. 60 tablet 6  . nitroGLYCERIN (NITROSTAT) 0.4 MG SL tablet Place 1 tablet (0.4 mg total) under the tongue every 5 (five) minutes as needed for chest pain. 25 tablet 3  . omeprazole (PRILOSEC) 40 MG capsule Take 40 mg by mouth daily  before breakfast.     . OXYGEN Inhale 3 mLs into the lungs.    . ranitidine (ZANTAC) 150 MG tablet Take 1 tablet (150 mg total) by mouth daily. (Patient taking differently: Take 150 mg by mouth 2 (two) times daily. ) 90 tablet 3  . vortioxetine HBr (TRINTELLIX) 20 MG TABS Take 20 mg by mouth daily. 90 tablet 0   No current facility-administered medications for this visit.     Allergies:   Brilinta [ticagrelor]; Cinnamon; Fish-derived products; Other; Penicillins; Strawberry extract; Amoxicillin-pot clavulanate; Crestor [rosuvastatin]; and Levaquin [levofloxacin]    Social History:  The patient  reports that she has never smoked. She has never used smokeless tobacco. She reports that she does not drink alcohol  or use drugs.   Family History:  The patient's family history includes Coronary artery disease in her brother and brother; Heart disease in her brother.    ROS: All other systems are reviewed and negative. Unless otherwise mentioned in H&P    PHYSICAL EXAM: VS:  BP (!) 118/58   Pulse 85   Ht  (1.651 m)   Wt 210 lb (95.3 kg)   SpO2 93%   BMI 34.95 kg/m  , BMI Body mass index is 34.95 kg/m. GEN: Well nourished, well developed, in no acute distress  HEENT: normal  Neck: no JVD, carotid bruits, or masses Cardiac: RRR; 1/6 holosystolic murmur murmurs, rubs, or gallops,no edema Respiratory:  Some expiratory wheezing, wearing O2. Nasal cannula GI: soft, nontender, nondistended, + BS MS: no deformity or atrophy  Skin: warm and dry, no rash Neuro:  Strength and sensation are intact Psych: euthymic mood, full affect   Recent Labs: 05/11/2017: ALT 23; B Natriuretic Peptide 478.5; Magnesium 1.8; TSH 0.040 24-May-2017: BUN 46; Creatinine, Ser 2.10; Hemoglobin 11.0; Platelets 244; Potassium 4.8; Sodium 139    Lipid Panel    Component Value Date/Time   CHOL 154 06/11/2016 1007   TRIG 174 (H) 06/11/2016 1007   HDL 47 06/11/2016 1007   CHOLHDL 3.3 06/11/2016 1007   VLDL 35  (H) 06/11/2016 1007   LDLCALC 72 06/11/2016 1007      Wt Readings from Last 3 Encounters:  07/25/17 210 lb (95.3 kg)  06/24/17 218 lb 4.8 oz (99 kg)  05/27/17 218 lb (98.9 kg)    Other studies Reviewed: Echocardiogram May 24, 2017 Left ventricle: The cavity size was normal. Wall thickness was   increased in a pattern of moderate LVH. Systolic function was   vigorous. The estimated ejection fraction was in the range of 65%   to 70%. Wall motion was normal; there were no regional wall   motion abnormalities. Doppler parameters are consistent with   pseudonormal left ventricular relaxation (grade 2 diastolic   dysfunction). The E/e&' ratio is >20, suggesting elevated LV   filling pressure. - Aortic valve: Poorly visualized. Mildly calcified leaflets. Mild   stenosis. Valve area (VTI): 1.63 cm^2. Valve area (Vmax): 1.71   cm^2. Valve area (Vmean): 1.65 cm^2. - Left atrium: The atrium was normal in size. - Right ventricle: RVH is noted. Systolic function is normal - the   ventricle is not dilated. Lateral annulus peak S velocity: 11.3   cm/s. - Right atrium: The atrium was normal in size. - Pulmonic valve: Poorly visualized. Elevated gradient consistent   with mild stenosis. - Inferior vena cava: The vessel was normal in size. The   respirophasic diameter changes were in the normal range (>= 50%),   consistent with normal central venous pressure.  Cardiac Cath 05/12/2017 Conclusion     Patent proximal to mid RCA stent.  Ost LAD to Prox LAD lesion, 30 %stenosed.  Mid LAD to Dist LAD lesion, 60 %stenosed.  Ost 2nd Mrg to 2nd Mrg lesion, 90 %stenosed.  2nd Mrg lesion, 99 %stenosed. Diffusely diseased vessel.  Mid RCA lesion, 100 %stenosed. This is the culprit. There are left to right collaterals.  LV end diastolic pressure is moderately elevated.  There is no aortic valve stenosis.  Hemodynamic findings consistent with mild to moderate pulmonary hypertension.  CO 5.6  L/min. CI 2.7. PA sat 64 %. Unable to wedge the catheter.   Occluded RCA appears to be the cause of her positive troponin.  Her SHOB is  from volume overload and her elevated LVEDP.  No chest pain in over 24 hours.  No benefit from revascularization at this point.        ASSESSMENT AND PLAN:  1. CAD: She offers no complaints of worsening chest discomfort, or palpitations. She is medically compliant. She is complaining of overall fatigue. This may be multifactorial in the setting of chronic COPD, oxygen dependence, and obesity. She is on metoprolol tartrate 50 mg twice a day. I will change to metoprolol sucicnate 50 mg at at bedtime to assist in feelings of fatigue during the day. She will continue dual antiplatelet therapy, and statin therapy.  2. O2 dependent COPD: She is being followed by pulmonary now. Had PFT study completed today with additional lab work. Dr. Juanetta Gosling to manage  3.  Hypertension: Blood pressure is well-controlled currently. Continue hydralazine 50 mg twice a day.  Current medicines are reviewed at length with the patient today.    Labs/ tests ordered today include:  Bettey Mare. Liborio Nixon, ANP, AACC   07/25/2017 1:33 PM    [facility] [address] [place], [place] [address] Phone: [phone]; Fax: [phone]

## 2017-07-27 LAB — PULMONARY FUNCTION TEST
DL/VA % pred: 101 %
DL/VA: 4.99 ml/min/mmHg/L
DLCO COR % PRED: 31 %
DLCO UNC: 8.12 ml/min/mmHg
DLCO cor: 8.07 ml/min/mmHg
DLCO unc % pred: 31 %
FEF 25-75 Post: 1.13 L/sec
FEF 25-75 Pre: 0.97 L/sec
FEF2575-%Change-Post: 16 %
FEF2575-%Pred-Post: 51 %
FEF2575-%Pred-Pre: 44 %
FEV1-%CHANGE-POST: 5 %
FEV1-%PRED-POST: 32 %
FEV1-%Pred-Pre: 30 %
FEV1-POST: 0.81 L
FEV1-PRE: 0.77 L
FEV1FVC-%CHANGE-POST: 2 %
FEV1FVC-%Pred-Pre: 114 %
FEV6-%Change-Post: 2 %
FEV6-%PRED-PRE: 27 %
FEV6-%Pred-Post: 28 %
FEV6-POST: 0.89 L
FEV6-PRE: 0.87 L
FEV6FVC-%PRED-POST: 104 %
FEV6FVC-%Pred-Pre: 104 %
FVC-%Change-Post: 2 %
FVC-%PRED-POST: 27 %
FVC-%PRED-PRE: 26 %
FVC-POST: 0.89 L
FVC-PRE: 0.87 L
POST FEV6/FVC RATIO: 100 %
Post FEV1/FVC ratio: 91 %
Pre FEV1/FVC ratio: 88 %
Pre FEV6/FVC Ratio: 100 %
RV % PRED: 56 %
RV: 1.21 L
TLC % PRED: 37 %
TLC: 1.97 L

## 2017-07-28 ENCOUNTER — Telehealth: Payer: Self-pay | Admitting: Cardiology

## 2017-07-28 ENCOUNTER — Ambulatory Visit: Payer: Self-pay

## 2017-07-28 ENCOUNTER — Other Ambulatory Visit: Payer: Self-pay

## 2017-07-28 MED ORDER — FUROSEMIDE 40 MG PO TABS: 40.0000 mg | ORAL_TABLET | Freq: Every day | ORAL | 3 refills | Status: DC

## 2017-07-28 NOTE — Telephone Encounter (Signed)
Patient has questions regarding her Furosemide dosage. / tg

## 2017-07-28 NOTE — Telephone Encounter (Signed)
Spoke with pt. Verified she should be on lasix 40 mg daily.

## 2017-08-06 ENCOUNTER — Ambulatory Visit: Payer: Self-pay

## 2017-08-18 ENCOUNTER — Ambulatory Visit: Payer: Self-pay

## 2017-08-27 ENCOUNTER — Ambulatory Visit: Payer: Self-pay | Admitting: Cardiology
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
## 2017-09-23 ENCOUNTER — Ambulatory Visit (HOSPITAL_COMMUNITY): Payer: Medicare Other | Admitting: Psychiatry

## 2017-09-23 ENCOUNTER — Encounter (HOSPITAL_COMMUNITY): Payer: Self-pay | Admitting: Psychiatry

## 2017-09-23 VITALS — BP 122/80 | HR 82 | Wt 200.0 lb

## 2017-09-23 DIAGNOSIS — F3342 Major depressive disorder, recurrent, in full remission: Secondary | ICD-10-CM

## 2017-09-23 DIAGNOSIS — R450 Nervousness: Secondary | ICD-10-CM | POA: Diagnosis not present

## 2017-09-23 DIAGNOSIS — N183 Chronic kidney disease, stage 3 (moderate): Secondary | ICD-10-CM | POA: Diagnosis not present

## 2017-09-23 DIAGNOSIS — E108 Type 1 diabetes mellitus with unspecified complications: Secondary | ICD-10-CM | POA: Diagnosis not present

## 2017-09-23 DIAGNOSIS — I10 Essential (primary) hypertension: Secondary | ICD-10-CM | POA: Diagnosis not present

## 2017-09-23 DIAGNOSIS — F419 Anxiety disorder, unspecified: Secondary | ICD-10-CM

## 2017-09-23 MED ORDER — DIAZEPAM 5 MG PO TABS: 5.0000 mg | ORAL_TABLET | Freq: Every day | ORAL | 2 refills | Status: DC | PRN

## 2017-09-23 MED ORDER — VORTIOXETINE HBR 20 MG PO TABS: 20.0000 mg | ORAL_TABLET | Freq: Every day | ORAL | 0 refills | Status: DC

## 2017-09-23 MED ORDER — ARIPIPRAZOLE 20 MG PO TABS: 10.0000 mg | ORAL_TABLET | Freq: Every evening | ORAL | 0 refills | Status: DC

## 2017-09-23 NOTE — Patient Instructions (Addendum)
1. Continue Trintellix 20 mg daily  2. Continue Abilify 10 mg daily  3. Continue Valium 5 mg daily as needed for anxiety  4. Return to clinic in three months for 15 mins

## 2017-09-25 ENCOUNTER — Ambulatory Visit
Admission: RE | Admit: 2017-09-25 | Discharge: 2017-09-25 | Disposition: A | Discharge status: Home / Self Care | Payer: Medicare Other | Source: Ambulatory Visit | Attending: Physician Assistant | Admitting: Physician Assistant

## 2017-09-25 DIAGNOSIS — Z1231 Encounter for screening mammogram for malignant neoplasm of breast: Secondary | ICD-10-CM

## 2017-10-19 ENCOUNTER — Other Ambulatory Visit: Payer: Self-pay | Admitting: Adult Health

## 2017-12-24 ENCOUNTER — Ambulatory Visit (HOSPITAL_COMMUNITY): Payer: Self-pay | Admitting: Psychiatry

## 2017-12-26 ENCOUNTER — Ambulatory Visit (HOSPITAL_COMMUNITY): Payer: Self-pay | Admitting: Psychiatry
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
## 2018-01-07 ENCOUNTER — Encounter (HOSPITAL_COMMUNITY): Payer: Self-pay | Admitting: Psychiatry

## 2018-01-07 ENCOUNTER — Ambulatory Visit (HOSPITAL_COMMUNITY): Payer: Medicare Other | Admitting: Psychiatry

## 2018-01-07 VITALS — BP 134/63 | HR 86 | Ht 65.0 in | Wt 210.0 lb

## 2018-01-07 DIAGNOSIS — F3342 Major depressive disorder, recurrent, in full remission: Secondary | ICD-10-CM | POA: Diagnosis not present

## 2018-01-07 DIAGNOSIS — F419 Anxiety disorder, unspecified: Secondary | ICD-10-CM | POA: Diagnosis not present

## 2018-01-07 DIAGNOSIS — Z599 Problem related to housing and economic circumstances, unspecified: Secondary | ICD-10-CM

## 2018-01-07 DIAGNOSIS — R450 Nervousness: Secondary | ICD-10-CM | POA: Diagnosis not present

## 2018-01-07 MED ORDER — ARIPIPRAZOLE 20 MG PO TABS: 10.0000 mg | ORAL_TABLET | Freq: Every evening | ORAL | 0 refills | Status: DC

## 2018-01-07 MED ORDER — DIAZEPAM 5 MG PO TABS: 5.0000 mg | ORAL_TABLET | Freq: Every day | ORAL | 0 refills | Status: DC | PRN

## 2018-01-07 MED ORDER — VORTIOXETINE HBR 20 MG PO TABS: 20.0000 mg | ORAL_TABLET | Freq: Every day | ORAL | 0 refills | Status: DC

## 2018-01-07 NOTE — Patient Instructions (Addendum)
1. Continue Trintellix 20 mg daily  2. Continue Abilify 10 mg daily  3. Continue Valium 5 mg daily as needed for anxiety  4. Return to clinic in three months for 15 mins 5. Referral to therapy

## 2018-01-22 NOTE — Progress Notes (Signed)
Cardiology Office Note  Date: 01/23/2018   ID: Teresa Hart, DOB 10/16/1951, MRN [id number]  PCP: Altamease OilerHarris, Meredith L, FNP  Primary Cardiologist: Nona DellSamuel Linsey Hirota, MD   Chief Complaint  Patient presents with  . Coronary Artery Disease    History of Present Illness: Teresa Hart is a 67 y.o. female last seen by Ms. Teresa Hart in September 2018.  She presents for a routine follow-up visit.  Overall reports doing well.  She is [facility] 3 days a week at [facility].  Reports no angina symptoms at this time.  States that she has been taking her medications regularly and has follow-up lab work with her PCP and endocrinologist.  Cardiac catheterization in June 2018 revealed patent RCA stent sites, nonobstructive disease within the LAD, diffusely diseased obtuse marginal system, and culprit lesion felt to be mid RCA which was occluded and associated with left to right collaterals. This was managed medically.  Includes aspirin, Lipitor, Plavix, Lasix, hydralazine, Toprol-XL, and as needed nitroglycerin.  Past Medical History:  Diagnosis Date  . Aortic stenosis, mild    a. per echo Jan 2013; EF is 61%  . Asthma   . CAD (coronary artery disease)    a. 11/2011 abnl MV; b. 11/2011 Cath: No obstructive disease in the large caliber vessels, disease in the small OM2 and distal LAD, not amenable to PCI or grafting; c. 01/2016 PCI: LM nl, LAD 30ost, 267m, LCX nl, OM1 small, OM2 90 diff, OM3 small, RCA  95p/m 2.75 x 24 Promus Premier DES, 2371m. Severe OM2 managed medically.  . Cardiac enlargement   . Carotid stenosis, bilateral    a. per doppler Jan 2013; 60-79% RICA, 40-59% LICA  . CKD (chronic kidney disease), stage III (HCC)   . GERD (gastroesophageal reflux disease)   . Hyperlipidemia   . Hypertensive heart disease   . Hypothyroid   . Obese   . Type I diabetes mellitus (HCC)     Past Surgical History:  Procedure Laterality Date  . ABDOMINAL HYSTERECTOMY    . APPENDECTOMY      . CARDIAC CATHETERIZATION  Jan 2013   No major obstructive disease in the large caliber vessels, with disease in a small OM2 and distal LAD; not amenable to PCI or grafting.   Teresa Hart. CARDIAC CATHETERIZATION N/A 02/05/2016   Procedure: Left Heart Cath and Coronary Angiography;  Surgeon: Peter M SwazilandJordan, MD;  [facility];  Service: Cardiovascular;  Laterality: N/A;  . CARDIAC CATHETERIZATION  02/05/2016   Procedure: Coronary Stent Intervention;  Surgeon: Peter M SwazilandJordan, MD;  [facility];  Service: Cardiovascular;;  . CARDIAC CATHETERIZATION N/A 02/20/2016   Procedure: Coronary Stent Intervention;  Surgeon: Lyn RecordsHenry W Smith, MD;  [facility];  Service: Cardiovascular;  Laterality: N/A;  . CATARACT EXTRACTION  08/2016  . KNEE ARTHROSCOPY  06/24/2012   Procedure: ARTHROSCOPY KNEE;  Surgeon: Nestor LewandowskyFrank J Rowan, MD;  Location: [facility];  Service: Orthopedics;  Laterality: Right;  DEBRIDEMENT OF CHONDROMALACIA  . KNEE SURGERY    . RIGHT/LEFT HEART CATH AND CORONARY ANGIOGRAPHY N/A 05/12/2017   Procedure: Right/Left Heart Cath and Coronary Angiography;  Surgeon: Corky CraftsVaranasi, Jayadeep S, MD;  [facility];  Service: Cardiovascular;  Laterality: N/A;  . TONSILLECTOMY      Current Outpatient Medications  Medication Sig Dispense Refill  . ACCU-CHEK AVIVA PLUS test strip CHECK BLOOD SUGAR 4 TIMES DAILY. 450 each 2  . albuterol (  PROVENTIL HFA;VENTOLIN HFA) 108 (90 BASE) MCG/ACT inhaler Inhale 2 puffs into the lungs every 6 (six) hours as needed. 18 g 2  . ARIPiprazole (ABILIFY) 20 MG tablet Take 0.5 tablets (10 mg total) by mouth every evening. 90 tablet 0  . aspirin EC 81 MG tablet Take 1 tablet (81 mg total) by mouth daily. 150 tablet 2  . atorvastatin (LIPITOR) 80 MG tablet TAKE 1 TABLET BY MOUTH AT BEDTIME FOR CHOLESTEROL 90 tablet 3  . Cholecalciferol (VITAMIN D3) 5000 units CAPS Take 5,000 Units by mouth daily.    . clopidogrel (PLAVIX) 75  MG tablet Take 75 mg by mouth once a day 90 tablet 3  . diazepam (VALIUM) 5 MG tablet Take 1 tablet (5 mg total) by mouth daily as needed for anxiety. 90 tablet 0  . Fluticasone-Umeclidin-Vilant (TRELEGY ELLIPTA) 100-62.5-25 MCG/INH AEPB Inhale daily into the lungs.    . furosemide (LASIX) 40 MG tablet Take 1 tablet (40 mg total) by mouth daily. 90 tablet 3  . hydrALAZINE (APRESOLINE) 50 MG tablet Take 50 mg by mouth 2 (two) times daily.    Teresa Hart HYDROcodone-acetaminophen (NORCO/VICODIN) 5-325 MG tablet Take 1 tablet by mouth every 6 (six) hours as needed for moderate pain.    Teresa Hart insulin lispro (HUMALOG) 100 UNIT/ML injection Inject into the skin 3 (three) times daily before meals.    . Insulin Pen Needle (B-D ULTRAFINE III SHORT PEN) 31G X 8 MM MISC 1 each by Does not apply route as directed. 150 each 3  . insulin regular human CONCENTRATED (HUMULIN R) 500 UNIT/ML kwikpen Use as directed. Max daily dose is 300 units.    Teresa Hart levocetirizine (XYZAL) 5 MG tablet Take 1 tablet (5 mg total) by mouth every evening. (Patient taking differently: Take 5 mg by mouth daily. ) 30 tablet 5  . Liraglutide (VICTOZA) 18 MG/3ML SOPN Inject 0.3 mLs (1.8 mg total) into the skin at bedtime. 27 pen 0  . metoprolol succinate (TOPROL-XL) 50 MG 24 hr tablet Take 1 tablet (50 mg total) by mouth at bedtime. 90 tablet 3  . nitroGLYCERIN (NITROSTAT) 0.4 MG SL tablet Place 1 tablet (0.4 mg total) under the tongue every 5 (five) minutes as needed for chest pain. 25 tablet 3  . omeprazole (PRILOSEC) 40 MG capsule Take 40 mg by mouth daily before breakfast.     . OXYGEN Inhale 3 mLs into the lungs.    . ranitidine (ZANTAC) 150 MG tablet Take 1 tablet (150 mg total) by mouth daily. (Patient taking differently: Take 150 mg by mouth 2 (two) times daily. ) 90 tablet 3  . vortioxetine HBr (TRINTELLIX) 20 MG TABS tablet Take 1 tablet (20 mg total) by mouth daily. 90 tablet 0   No current facility-administered medications for this visit.     Allergies:  Brilinta [ticagrelor]; Cinnamon; Fish-derived products; Other; Penicillins; Strawberry extract; Amoxicillin-pot clavulanate; Crestor [rosuvastatin]; and Levaquin [levofloxacin]   Social History: The patient  reports that  has never smoked. she has never used smokeless tobacco. She reports that she does not drink alcohol or use drugs.   ROS:  Please see the history of present illness. Otherwise, complete review of systems is positive for intermittent muscle soreness.  All other systems are reviewed and negative.   Physical Exam: VS:  BP 134/66   Pulse 71   Ht 5\' 5"  (1.651 m)   Wt 213 lb (96.6 kg)   SpO2 97%   BMI 35.45 kg/m , BMI Body mass index is  35.45 kg/m.  Wt Readings from Last 3 Encounters:  01/23/18 213 lb (96.6 kg)  07/25/17 210 lb (95.3 kg)  06/24/17 218 lb 4.8 oz (99 kg)    General: Patient appears comfortable at rest. HEENT: Conjunctiva and lids normal, oropharynx clear. Neck: Supple, no elevated JVP or carotid bruits, no thyromegaly. Lungs: Clear to auscultation, nonlabored breathing at rest. Cardiac: Regular rate and rhythm, no S3, 2/6 systolic murmur, no pericardial rub. Abdomen: Soft, nontender, bowel sounds present. Extremities: No pitting edema, distal pulses 2+. Skin: Warm and dry. Musculoskeletal: No kyphosis. Neuropsychiatric: Alert and oriented x3, affect grossly appropriate.  ECG: I personally reviewed the tracing from 05/11/2017 which showed sinus rhythm with diffuse ST-T wave abnormalities.  Recent Labwork: 05/11/2017: ALT 23; AST 52; B Natriuretic Peptide 478.5; Magnesium 1.8; TSH 0.040 05/13/2017: BUN 46; Creatinine, Ser 2.10; Hemoglobin 11.0; Platelets 244; Potassium 4.8; Sodium 139     Component Value Date/Time   CHOL 154 06/11/2016 1007   TRIG 174 (H) 06/11/2016 1007   HDL 47 06/11/2016 1007   CHOLHDL 3.3 06/11/2016 1007   VLDL 35 (H) 06/11/2016 1007   LDLCALC 72 06/11/2016 1007  August 2018: BUN 19, creatinine 1.88, potassium  4.5  Other Studies Reviewed Today:  Cardiac catheterization 05/12/2017:  Patent proximal to mid RCA stent.  Ost LAD to Prox LAD lesion, 30 %stenosed.  Mid LAD to Dist LAD lesion, 60 %stenosed.  Ost 2nd Mrg to 2nd Mrg lesion, 90 %stenosed.  2nd Mrg lesion, 99 %stenosed. Diffusely diseased vessel.  Mid RCA lesion, 100 %stenosed. This is the culprit. There are left to right collaterals.  LV end diastolic pressure is moderately elevated.  There is no aortic valve stenosis.  Hemodynamic findings consistent with mild to moderate pulmonary hypertension.  CO 5.6 L/min. CI 2.7. PA sat 64 %. Unable to wedge the catheter.  Occluded RCA appears to be the cause of her positive troponin. Her SHOB is from volume overload and her elevated LVEDP. No chest pain in over 24 hours. No benefit from revascularization at this point.   Continue diuresis with careful check of her renal function. Aggressive secondary prevention.  Echocardiogram 05/13/2017: Study Conclusions  - Left ventricle: The cavity size was normal. Wall thickness was increased in a pattern of moderate LVH. Systolic function was vigorous. The estimated ejection fraction was in the range of 65% to 70%. Wall motion was normal; there were no regional wall motion abnormalities. Doppler parameters are consistent with pseudonormal left ventricular relaxation (grade 2 diastolic dysfunction). The E/e&' ratio is >20, suggesting elevated LV filling pressure. - Aortic valve: Poorly visualized. Mildly calcified leaflets. Mild stenosis. Valve area (VTI): 1.63 cm^2. Valve area (Vmax): 1.71 cm^2. Valve area (Vmean): 1.65 cm^2. - Left atrium: The atrium was normal in size. - Right ventricle: RVH is noted. Systolic function is normal - the ventricle is not dilated. Lateral annulus peak S velocity: 11.3 cm/s. - Right atrium: The atrium was normal in size. - Pulmonic valve: Poorly visualized. Elevated gradient  consistent with mild stenosis. - Inferior vena cava: The vessel was normal in size. The respirophasic diameter changes were in the normal range (>= 50%), consistent with normal central venous pressure.  Impressions:  - Compared to a prior study in 01/2016, there are few changes. There is mild aortic and pulmonic stenosis, RVH is noted. Grade 2 DD with elevated LV filling pressure is present.  Assessment and Plan:  1.  Multivessel CAD as discussed above, continuing medical therapy with adequate symptom control.  I encouraged regular exercise plan.  No changes were made today in regimen.  2.  CKD stage III, last creatinine 2.1.  Follows with a nephrologist.  Not on ACE inhibitor or ARB.  3.  Mixed hyperlipidemia, on Lipitor.  Keep follow-up lab work with endocrinology.  Aiming for LDL of 70 or less.  4.  Mild aortic stenosis, asymptomatic.  Current medicines were reviewed with the patient today.  Disposition: Follow-up in 6 months.  Signed, Jonelle Sidle, MD, [facility] 01/23/2018 10:05 AM    [facility] at [facility] [address] [place], [place], [place] [address] Phone: [phone]; Fax: [phone]

## 2018-01-23 ENCOUNTER — Ambulatory Visit: Payer: Medicare Other | Admitting: Cardiology

## 2018-01-23 ENCOUNTER — Encounter: Payer: Self-pay | Admitting: Cardiology

## 2018-01-23 VITALS — BP 134/66 | HR 71 | Ht 65.0 in | Wt 213.0 lb

## 2018-01-23 DIAGNOSIS — N183 Chronic kidney disease, stage 3 unspecified: Secondary | ICD-10-CM

## 2018-01-23 DIAGNOSIS — E782 Mixed hyperlipidemia: Secondary | ICD-10-CM

## 2018-01-23 DIAGNOSIS — I350 Nonrheumatic aortic (valve) stenosis: Secondary | ICD-10-CM | POA: Diagnosis not present

## 2018-01-23 DIAGNOSIS — I25119 Atherosclerotic heart disease of native coronary artery with unspecified angina pectoris: Secondary | ICD-10-CM | POA: Diagnosis not present

## 2018-01-23 MED ORDER — FUROSEMIDE 40 MG PO TABS: 40.0000 mg | ORAL_TABLET | Freq: Every day | ORAL | 3 refills | Status: DC

## 2018-01-23 MED ORDER — CLOPIDOGREL BISULFATE 75 MG PO TABS: ORAL_TABLET | ORAL | 3 refills | Status: DC

## 2018-01-23 MED ORDER — ATORVASTATIN CALCIUM 80 MG PO TABS: ORAL_TABLET | ORAL | 3 refills | Status: DC

## 2018-01-23 MED ORDER — NITROGLYCERIN 0.4 MG SL SUBL: 0.4000 mg | SUBLINGUAL_TABLET | SUBLINGUAL | 3 refills | Status: AC | PRN

## 2018-01-23 MED ORDER — METOPROLOL SUCCINATE ER 50 MG PO TB24: 50.0000 mg | ORAL_TABLET | Freq: Every day | ORAL | 3 refills | Status: DC

## 2018-01-23 NOTE — Patient Instructions (Addendum)
Your physician wants you to follow-up in: 6 months with Dr.McDowell You will receive a reminder letter in the mail two months in advance. If you don't receive a letter, please call our office to schedule the follow-up appointment.    Your physician recommends that you continue on your current medications as directed. Please refer to the Current Medication list given to you today.   I refilled your cardiac medications to [facility]    No lab work or tests ordered today.    Thank you for choosing [facility] !

## 2018-02-17 ENCOUNTER — Ambulatory Visit (HOSPITAL_COMMUNITY): Payer: Self-pay | Admitting: Psychiatry

## 2018-02-20 ENCOUNTER — Telehealth: Payer: Self-pay | Admitting: Cardiology

## 2018-02-20 MED ORDER — ATORVASTATIN CALCIUM 40 MG PO TABS: ORAL_TABLET | ORAL | 3 refills | Status: DC

## 2018-02-20 NOTE — Telephone Encounter (Signed)
Patient would like to speak with nurse regarding not being able to tolerate the Atorvastatin. / tg

## 2018-02-20 NOTE — Telephone Encounter (Signed)
Consider cutting the dose back to 40 mg daily.

## 2018-02-20 NOTE — Telephone Encounter (Signed)
Patient states she continues to have arm pain on atorvastatin

## 2018-02-20 NOTE — Telephone Encounter (Signed)
Patient will reduce atorvastatin to 40 mg daily

## 2018-02-23 ENCOUNTER — Emergency Department (HOSPITAL_COMMUNITY): Payer: Medicare Other

## 2018-02-23 ENCOUNTER — Other Ambulatory Visit: Payer: Self-pay

## 2018-02-23 ENCOUNTER — Emergency Department (HOSPITAL_COMMUNITY)
Admission: EM | Admit: 2018-02-23 | Discharge: 2018-02-23 | Disposition: A | Discharge status: Home / Self Care | Payer: Medicare Other | Attending: Emergency Medicine | Admitting: Emergency Medicine

## 2018-02-23 ENCOUNTER — Encounter (HOSPITAL_COMMUNITY): Payer: Self-pay | Admitting: Emergency Medicine

## 2018-02-23 DIAGNOSIS — E1022 Type 1 diabetes mellitus with diabetic chronic kidney disease: Secondary | ICD-10-CM | POA: Insufficient documentation

## 2018-02-23 DIAGNOSIS — Y33XXXA Other specified events, undetermined intent, initial encounter: Secondary | ICD-10-CM | POA: Diagnosis not present

## 2018-02-23 DIAGNOSIS — S8991XA Unspecified injury of right lower leg, initial encounter: Secondary | ICD-10-CM | POA: Insufficient documentation

## 2018-02-23 DIAGNOSIS — E039 Hypothyroidism, unspecified: Secondary | ICD-10-CM | POA: Diagnosis not present

## 2018-02-23 DIAGNOSIS — Y929 Unspecified place or not applicable: Secondary | ICD-10-CM | POA: Diagnosis not present

## 2018-02-23 DIAGNOSIS — Z794 Long term (current) use of insulin: Secondary | ICD-10-CM | POA: Diagnosis not present

## 2018-02-23 DIAGNOSIS — I130 Hypertensive heart and chronic kidney disease with heart failure and stage 1 through stage 4 chronic kidney disease, or unspecified chronic kidney disease: Secondary | ICD-10-CM | POA: Insufficient documentation

## 2018-02-23 DIAGNOSIS — Y998 Other external cause status: Secondary | ICD-10-CM | POA: Diagnosis not present

## 2018-02-23 DIAGNOSIS — I2510 Atherosclerotic heart disease of native coronary artery without angina pectoris: Secondary | ICD-10-CM | POA: Diagnosis not present

## 2018-02-23 DIAGNOSIS — J449 Chronic obstructive pulmonary disease, unspecified: Secondary | ICD-10-CM | POA: Insufficient documentation

## 2018-02-23 DIAGNOSIS — Z7902 Long term (current) use of antithrombotics/antiplatelets: Secondary | ICD-10-CM | POA: Diagnosis not present

## 2018-02-23 DIAGNOSIS — N183 Chronic kidney disease, stage 3 (moderate): Secondary | ICD-10-CM | POA: Insufficient documentation

## 2018-02-23 DIAGNOSIS — Z7982 Long term (current) use of aspirin: Secondary | ICD-10-CM | POA: Diagnosis not present

## 2018-02-23 DIAGNOSIS — E785 Hyperlipidemia, unspecified: Secondary | ICD-10-CM | POA: Insufficient documentation

## 2018-02-23 DIAGNOSIS — I5032 Chronic diastolic (congestive) heart failure: Secondary | ICD-10-CM | POA: Insufficient documentation

## 2018-02-23 DIAGNOSIS — Z79899 Other long term (current) drug therapy: Secondary | ICD-10-CM | POA: Insufficient documentation

## 2018-02-23 DIAGNOSIS — Y939 Activity, unspecified: Secondary | ICD-10-CM | POA: Insufficient documentation

## 2018-02-23 HISTORY — DX: Chronic obstructive pulmonary disease, unspecified: J44.9

## 2018-02-23 MED ORDER — HYDROCODONE-ACETAMINOPHEN 5-325 MG PO TABS: ORAL_TABLET | ORAL | 0 refills | Status: DC

## 2018-02-23 MED ORDER — OXYCODONE-ACETAMINOPHEN 5-325 MG PO TABS
1.0000 | ORAL_TABLET | Freq: Once | ORAL | Status: AC
  Administered 2018-02-23: 1 via ORAL
  Filled 2018-02-23: qty 1

## 2018-02-23 NOTE — ED Provider Notes (Signed)
[facility] EMERGENCY DEPARTMENT Provider Note   CSN: [id number] Arrival date & time: 02/23/18  1921     History   Chief Complaint Chief Complaint  Patient presents with  . Fall    HPI Teresa Hart is a 67 y.o. female.  HPI   Teresa Hart is a 67 y.o. female who presents to the Emergency Department complaining of right knee pain secondary to a fall.  She describes a mechanical fall with a twisting injury to the medial knee.  She reports being able to bend her knee, but it causes pain.  She has applied ice without relief.  Denies other injuries. Also denies swelling, numbness, or skin changes.   Past Medical History:  Diagnosis Date  . Aortic stenosis, mild    a. per echo Jan 2013; EF is 61%  . Asthma   . CAD (coronary artery disease)    a. 11/2011 abnl MV; b. 11/2011 Cath: No obstructive disease in the large caliber vessels, disease in the small OM2 and distal LAD, not amenable to PCI or grafting; c. 01/2016 PCI: LM nl, LAD 30ost, 24m, LCX nl, OM1 small, OM2 90 diff, OM3 small, RCA  95p/m 2.75 x 24 Promus Premier DES, 69m. Severe OM2 managed medically.  . Cardiac enlargement   . Carotid stenosis, bilateral    a. per doppler Jan 2013; 60-79% RICA, 40-59% LICA  . CKD (chronic kidney disease), stage III (HCC)   . COPD (chronic obstructive pulmonary disease) (HCC)   . GERD (gastroesophageal reflux disease)   . Hyperlipidemia   . Hypertensive heart disease   . Hypothyroid   . Obese   . Type I diabetes mellitus [facility])     Patient Active Problem List   Diagnosis Date Noted  . MDD (major depressive disorder), recurrent, in full remission (HCC) 05/20/2017  . Anxiety disorder 05/20/2017  . Respiratory failure with hypoxia (HCC) 05/11/2017  . Pneumonia 05/11/2017  . Acute diastolic CHF (congestive heart failure) (HCC)   . DM type 2 causing vascular disease (HCC) 03/04/2016  . Bilateral carotid bruits 02/13/2016  . Normocytic anemia 02/06/2016  . Nocturnal Sinus  bradycardia 02/06/2016  . Snoring 02/06/2016  . Hypertensive heart disease   . CKD (chronic kidney disease), stage III (HCC)   . Non-ST elevation (NSTEMI) myocardial infarction (HCC) 02/02/2016  . Diabetes mellitus with stage 3 chronic kidney disease (HCC) 11/30/2015  . Chronic renal failure 01/28/2012  . CAD (coronary artery disease) 12/27/2011  . Aortic stenosis, mild 12/27/2011  . Nonspecific abnormal unspecified cardiovascular function study 12/10/2011  . Cerebrovascular disease 12/10/2011  . Dyspnea 10/29/2011  . Hypertension 10/29/2011  . Hyperlipidemia 10/29/2011  . Murmur 10/29/2011  . Bruit 10/29/2011  . Cardiac enlargement 10/29/2011    Past Surgical History:  Procedure Laterality Date  . ABDOMINAL HYSTERECTOMY    . APPENDECTOMY    . CARDIAC CATHETERIZATION  Jan 2013   No major obstructive disease in the large caliber vessels, with disease in a small OM2 and distal LAD; not amenable to PCI or grafting.   Marland Kitchen CARDIAC CATHETERIZATION N/A 02/05/2016   Procedure: Left Heart Cath and Coronary Angiography;  Surgeon: Peter M Swaziland, MD;  Location: [facility] INVASIVE CV LAB;  Service: Cardiovascular;  Laterality: N/A;  . CARDIAC CATHETERIZATION  02/05/2016   Procedure: Coronary Stent Intervention;  Surgeon: Peter M Swaziland, MD;  Location: [facility] INVASIVE CV LAB;  Service: Cardiovascular;;  . CARDIAC CATHETERIZATION N/A 02/20/2016   Procedure: Coronary Stent Intervention;  Surgeon: Lyn Records,  MD;  [facility] INV[facility] CV LAB;  Service: Cardiovascular;  Laterality: N/A;  . CATARACT EXTRACTION  08/2016  . KNEE ARTHROSCOPY  06/24/2012   Procedure: ARTHROSCOPY KNEE;  Surgeon: Nestor LewandowskyFrank J Rowan, MD;  Location: [facility];  Service: Orthopedics;  Laterality: Right;  DEBRIDEMENT OF CHONDROMALACIA  . KNEE SURGERY    . RIGHT/LEFT HEART CATH AND CORONARY ANGIOGRAPHY N/A 05/12/2017   Procedure: Right/Left Heart Cath and Coronary Angiography;  Surgeon: Corky CraftsVaranasi, Jayadeep S, MD;  [facility];  Service: Cardiovascular;  Laterality: N/A;  . TONSILLECTOMY       OB History   None      Home Medications    Prior to Admission medications   Medication Sig Start Date End Date Taking? Authorizing Provider  ACCU-CHEK AVIVA PLUS test strip CHECK BLOOD SUGAR 4 TIMES DAILY. 02/26/16   Roma KayserNida, Gebreselassie W, MD  albuterol (PROVENTIL HFA;VENTOLIN HFA) 108 (90 BASE) MCG/ACT inhaler Inhale 2 puffs into the lungs every 6 (six) hours as needed. 11/16/12   Copland, Gwenlyn FoundJessica C, MD  ARIPiprazole (ABILIFY) 20 MG tablet Take 0.5 tablets (10 mg total) by mouth every evening. 01/07/18   Neysa HotterHisada, Reina, MD  aspirin EC 81 MG tablet Take 1 tablet (81 mg total) by mouth daily. 10/29/11   Lewayne Buntingrenshaw, Brian S, MD  atorvastatin (LIPITOR) 40 MG tablet TAKE 1 TABLET BY MOUTH AT BEDTIME FOR CHOLESTEROL 02/20/18   Jonelle SidleMcDowell, Samuel G, MD  Cholecalciferol (VITAMIN D3) 5000 units CAPS Take 5,000 Units by mouth daily.    [provider]  clopidogrel (PLAVIX) 75 MG tablet Take 75 mg by mouth once a day 01/23/18   Jonelle SidleMcDowell, Samuel G, MD  diazepam (VALIUM) 5 MG tablet Take 1 tablet (5 mg total) by mouth daily as needed for anxiety. 01/07/18   Neysa HotterHisada, Reina, MD  Fluticasone-Umeclidin-Vilant (TRELEGY ELLIPTA) 100-62.5-25 MCG/INH AEPB Inhale daily into the lungs.    [provider]  furosemide (LASIX) 40 MG tablet Take 1 tablet (40 mg total) by mouth daily. 01/23/18   Jonelle SidleMcDowell, Samuel G, MD  hydrALAZINE (APRESOLINE) 50 MG tablet Take 50 mg by mouth 2 (two) times daily.    [provider]  HYDROcodone-acetaminophen (NORCO/VICODIN) 5-325 MG tablet Take 1 tablet by mouth every 6 (six) hours as needed for moderate pain.    [provider]  insulin lispro (HUMALOG) 100 UNIT/ML injection Inject into the skin 3 (three) times daily before meals.    [provider]  Insulin Pen Needle (B-D ULTRAFINE III SHORT PEN) 31G X 8 MM MISC 1 each by Does not apply route as directed. 03/04/16    Roma KayserNida, Gebreselassie W, MD  insulin regular human CONCENTRATED (HUMULIN R) 500 UNIT/ML kwikpen Use as directed. Max daily dose is 300 units. 09/03/17   [provider]  levocetirizine (XYZAL) 5 MG tablet Take 1 tablet (5 mg total) by mouth every evening. Patient taking differently: Take 5 mg by mouth daily.  02/18/13   Sherren MochaShaw, Eva N, MD  Liraglutide (VICTOZA) 18 MG/3ML SOPN Inject 0.3 mLs (1.8 mg total) into the skin at bedtime. 06/28/16   Roma KayserNida, Gebreselassie W, MD  metoprolol succinate (TOPROL-XL) 50 MG 24 hr tablet Take 1 tablet (50 mg total) by mouth at bedtime. 01/23/18   Jonelle SidleMcDowell, Samuel G, MD  nitroGLYCERIN (NITROSTAT) 0.4 MG SL tablet Place 1 tablet (0.4 mg total) under the tongue every 5 (five) minutes as needed for chest pain. 01/23/18 03/01/22  Jonelle SidleMcDowell, Samuel G, MD  omeprazole (PRILOSEC) 40 MG  capsule Take 40 mg by mouth daily before breakfast.  10/31/16   [provider]  OXYGEN Inhale 3 mLs into the lungs.    [provider]  ranitidine (ZANTAC) 150 MG tablet Take 1 tablet (150 mg total) by mouth daily. Patient taking differently: Take 150 mg by mouth 2 (two) times daily.  05/06/16   Jodelle Gross, NP  vortioxetine HBr (TRINTELLIX) 20 MG TABS tablet Take 1 tablet (20 mg total) by mouth daily. 01/07/18   Neysa Hotter, MD    Family History Family History  Problem Relation Age of Onset  . Coronary artery disease Brother        CABG 58  . Heart disease Brother   . Coronary artery disease Brother        CABG 1    Social History Social History   Tobacco Use  . Smoking status: Never Smoker  . Smokeless tobacco: Never Used  Substance Use Topics  . Alcohol use: No  . Drug use: No     Allergies   Brilinta [ticagrelor]; Cinnamon; Fish-derived products; Other; Penicillins; Strawberry extract; Amoxicillin-pot clavulanate; Crestor [rosuvastatin]; and Levaquin [levofloxacin]   Review of Systems Review of Systems  Constitutional: Negative for chills and  fever.  Musculoskeletal: Positive for arthralgias (right knee pain). Negative for back pain, joint swelling and neck pain.  Skin: Negative for color change and wound.  Neurological: Negative for weakness, numbness and headaches.  All other systems reviewed and are negative.    Physical Exam Updated Vital Signs BP 127/60 (BP Location: Right Arm)   Pulse 77   Temp 99.6 F (37.6 C) (Oral)   Resp 19   Ht 5\' 5"  (1.651 m)   Wt 96.2 kg (212 lb)   SpO2 95%   BMI 35.28 kg/m   Physical Exam  Constitutional: She is oriented to person, place, and time. She appears well-developed and well-nourished. No distress.  Neck: Normal range of motion.  Cardiovascular: Normal rate, regular rhythm and intact distal pulses.  Pulmonary/Chest: Effort normal and breath sounds normal.  Musculoskeletal: She exhibits tenderness. She exhibits no edema or deformity.  ttp of the anteromedial right knee.  No erythema, effusion, or step-off deformity.  Neg drawer test  Neurological: She is alert and oriented to person, place, and time. No sensory deficit. She exhibits normal muscle tone. Coordination normal.  Skin: Skin is warm and dry. Capillary refill takes less than 2 seconds. No erythema.  Nursing note and vitals reviewed.    ED Treatments / Results  Labs (all labs ordered are listed, but only abnormal results are displayed) Labs Reviewed - No data to display  EKG None  Radiology Dg Knee Complete 4 Views Right  Result Date: 02/23/2018 CLINICAL DATA:  Right knee pain. EXAM: RIGHT KNEE - COMPLETE 4+ VIEW COMPARISON:  None. FINDINGS: No evidence of fracture, dislocation, or joint effusion. No evidence of arthropathy or other focal bone abnormality. Soft tissues are unremarkable. IMPRESSION: Negative. Electronically Signed   By: Signa Kell M.D.   On: 02/23/2018 20:35    Procedures Procedures (including critical care time)  Medications Ordered in ED Medications  oxyCODONE-acetaminophen  (PERCOCET/ROXICET) 5-325 MG per tablet 1 tablet (1 tablet Oral Given 02/23/18 2049)     Initial Impression / Assessment and Plan / ED Course  I have reviewed the triage vital signs and the nursing notes.  Pertinent labs & imaging results that were available during my care of the patient were reviewed by me and considered in my  medical decision making (see chart for details).     Pt well appearing.  Ambulatory with steady gait.  XR neg for fx.  NV intact.  Discussed possible meniscal injury and need for ortho f/u.  Pt agrees to plan.  Knee immob applied.   Final Clinical Impressions(s) / ED Diagnoses   Final diagnoses:  Injury of right knee, initial encounter    ED Discharge Orders    None       Rosey Bath 02/25/18 2102    Terrilee Files, MD 02/27/18 [phone]

## 2018-02-23 NOTE — ED Triage Notes (Signed)
Slipped and fell landed on rt knee.  Knee is now painful and stiff

## 2018-02-23 NOTE — Discharge Instructions (Addendum)
Apply ice packs on/off to your knee.  Wear the brace as needed for weight bearing.  Call Dr. Mort SawyersHarrison's office to arrange a follow-up appt in one week if the pain is not improving

## 2018-02-25 ENCOUNTER — Other Ambulatory Visit (HOSPITAL_COMMUNITY): Payer: Self-pay | Admitting: Psychiatry

## 2018-02-25 MED ORDER — VORTIOXETINE HBR 20 MG PO TABS: 20.0000 mg | ORAL_TABLET | Freq: Every day | ORAL | 0 refills | Status: DC
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
## 2018-04-07 ENCOUNTER — Encounter (HOSPITAL_COMMUNITY): Payer: Self-pay | Admitting: Psychiatry

## 2018-04-07 ENCOUNTER — Ambulatory Visit (INDEPENDENT_AMBULATORY_CARE_PROVIDER_SITE_OTHER): Payer: Medicare Other | Admitting: Psychiatry

## 2018-04-07 VITALS — BP 111/70 | HR 79 | Ht 65.0 in | Wt 218.0 lb

## 2018-04-07 DIAGNOSIS — Z91410 Personal history of adult physical and sexual abuse: Secondary | ICD-10-CM

## 2018-04-07 DIAGNOSIS — R450 Nervousness: Secondary | ICD-10-CM | POA: Diagnosis not present

## 2018-04-07 DIAGNOSIS — Z91411 Personal history of adult psychological abuse: Secondary | ICD-10-CM

## 2018-04-07 DIAGNOSIS — F419 Anxiety disorder, unspecified: Secondary | ICD-10-CM | POA: Diagnosis not present

## 2018-04-07 DIAGNOSIS — F3342 Major depressive disorder, recurrent, in full remission: Secondary | ICD-10-CM

## 2018-04-07 MED ORDER — DIAZEPAM 5 MG PO TABS: 5.0000 mg | ORAL_TABLET | Freq: Every day | ORAL | 0 refills | Status: DC | PRN

## 2018-04-07 MED ORDER — ARIPIPRAZOLE 20 MG PO TABS: 10.0000 mg | ORAL_TABLET | Freq: Every evening | ORAL | 1 refills | Status: DC

## 2018-04-07 MED ORDER — VORTIOXETINE HBR 20 MG PO TABS: 20.0000 mg | ORAL_TABLET | Freq: Every day | ORAL | 1 refills | Status: DC

## 2018-04-07 NOTE — Patient Instructions (Signed)
1. Continue Trintellix 20 mg daily  2. Continue Abilify 10 mg daily  3. Continue Valium 5 mg daily as needed for anxiety  4. Return to clinic in four months for 15 mins

## 2018-04-08 ENCOUNTER — Telehealth: Payer: Self-pay

## 2018-04-08 MED ORDER — PANTOPRAZOLE SODIUM 40 MG PO TBEC: 40.0000 mg | DELAYED_RELEASE_TABLET | Freq: Every day | ORAL | 3 refills | Status: DC

## 2018-04-08 NOTE — Telephone Encounter (Signed)
-----   Message from Jonelle Sidle, MD sent at 04/08/2018 12:40 PM EDT ----- Regarding: RE: Pt on Omeprazole and Plavix She may change from Prilosec to Protonix.  ----- Message ----- From: Nori Riis, RN Sent: 04/08/2018  11:54 AM To: Jonelle Sidle, MD Subject: Pt on Omeprazole and Plavix                    Pharmacy suggests change to pantoprazole because of plavix and omeprazole interaction.Do you concur ?

## 2018-04-08 NOTE — Telephone Encounter (Signed)
Pt aware of change.

## 2018-06-17 ENCOUNTER — Other Ambulatory Visit (HOSPITAL_COMMUNITY): Payer: Self-pay | Admitting: Psychiatry

## 2018-06-17 ENCOUNTER — Telehealth (HOSPITAL_COMMUNITY): Payer: Self-pay | Admitting: *Deleted

## 2018-06-17 MED ORDER — DIAZEPAM 5 MG PO TABS: 5.0000 mg | ORAL_TABLET | Freq: Every day | ORAL | 0 refills | Status: DC | PRN

## 2018-06-17 NOTE — Telephone Encounter (Signed)
Dr Vanetta ShawlHisada Patient called stating that her Rx ([facility]) has requested refills for her & have been denied. She's requesting refill on her Diazepam 5 mg. Marland Kitchen. [facility] does 90 supply

## 2018-06-17 NOTE — Telephone Encounter (Signed)
Ordered. She will be able to get refill after 8/15

## 2018-07-09 ENCOUNTER — Ambulatory Visit (HOSPITAL_COMMUNITY)
Admission: RE | Admit: 2018-07-09 | Discharge: 2018-07-09 | Disposition: A | Discharge status: Home / Self Care | Payer: Medicare Other | Source: Ambulatory Visit | Attending: Pulmonary Disease | Admitting: Pulmonary Disease

## 2018-07-09 ENCOUNTER — Other Ambulatory Visit (HOSPITAL_COMMUNITY): Payer: Self-pay | Admitting: Pulmonary Disease

## 2018-07-09 DIAGNOSIS — R0602 Shortness of breath: Secondary | ICD-10-CM

## 2018-07-09 DIAGNOSIS — R079 Chest pain, unspecified: Secondary | ICD-10-CM | POA: Diagnosis present

## 2018-07-09 DIAGNOSIS — J90 Pleural effusion, not elsewhere classified: Secondary | ICD-10-CM | POA: Diagnosis not present

## 2018-07-09 DIAGNOSIS — J9811 Atelectasis: Secondary | ICD-10-CM | POA: Insufficient documentation

## 2018-07-09 DIAGNOSIS — I700 Atherosclerosis of aorta: Secondary | ICD-10-CM | POA: Diagnosis not present

## 2018-07-09 LAB — POCT I-STAT, CHEM 8
BUN: 27 mg/dL — ABNORMAL HIGH (ref 8–23)
CREATININE: 2.1 mg/dL — AB (ref 0.44–1.00)
Calcium, Ion: 1.19 mmol/L (ref 1.15–1.40)
Chloride: 99 mmol/L (ref 98–111)
Glucose, Bld: 47 mg/dL — ABNORMAL LOW (ref 70–99)
HCT: 29 % — ABNORMAL LOW (ref 36.0–46.0)
HEMOGLOBIN: 9.9 g/dL — AB (ref 12.0–15.0)
Potassium: 4 mmol/L (ref 3.5–5.1)
Sodium: 141 mmol/L (ref 135–145)
TCO2: 31 mmol/L (ref 22–32)

## 2018-08-04 ENCOUNTER — Ambulatory Visit: Payer: Self-pay | Admitting: Physician Assistant
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
## 2018-08-05 ENCOUNTER — Telehealth: Payer: Self-pay | Admitting: Cardiology

## 2018-08-05 NOTE — Telephone Encounter (Signed)
Called pt. Advised her of medication change. Requested records.

## 2018-08-05 NOTE — Telephone Encounter (Signed)
Patient is wanting to speak with nurse regarding SOB and taking extra Lasix/tg

## 2018-08-05 NOTE — Telephone Encounter (Signed)
[facility].  If she has not had an echocardiogram done as part of her recent work-up, this should be ordered as well.  Her shortness of breath could be multifactorial, if she increased her Lasix in the short-term to 80 mg in the morning and 40 mg in the afternoon, we can assess for any potential contribution from fluid gain and discuss at office follow-up.

## 2018-08-05 NOTE — Telephone Encounter (Signed)
Returned pt call. [facility]. Since coming home from hospital she has been gaining weight. She is on a 1.5 liter per day restriction, <2 G sodium diet. She is currently taking her lasix 40 mg BID (under advise of pulmonologist) but says she continues to feel more SOB. She has gained 5 lbs in the past few days but does not notice any swelling anywhere. She denies chest pain, dizziness. She asks if she could increase her lasix dose until Friday, when she will see cardiology. Please advise.

## 2018-08-07 ENCOUNTER — Encounter: Payer: Self-pay | Admitting: Cardiology

## 2018-08-07 ENCOUNTER — Ambulatory Visit: Payer: Medicare Other | Admitting: Cardiology

## 2018-08-07 VITALS — BP 113/56 | HR 88 | Ht 65.0 in | Wt 214.0 lb

## 2018-08-07 DIAGNOSIS — I10 Essential (primary) hypertension: Secondary | ICD-10-CM | POA: Diagnosis not present

## 2018-08-07 DIAGNOSIS — I25119 Atherosclerotic heart disease of native coronary artery with unspecified angina pectoris: Secondary | ICD-10-CM | POA: Diagnosis not present

## 2018-08-07 DIAGNOSIS — N183 Chronic kidney disease, stage 3 unspecified: Secondary | ICD-10-CM

## 2018-08-07 DIAGNOSIS — I255 Ischemic cardiomyopathy: Secondary | ICD-10-CM

## 2018-08-07 DIAGNOSIS — E782 Mixed hyperlipidemia: Secondary | ICD-10-CM | POA: Diagnosis not present

## 2018-08-07 MED ORDER — ATORVASTATIN CALCIUM 40 MG PO TABS: ORAL_TABLET | ORAL | 3 refills | Status: AC

## 2018-08-07 MED ORDER — FUROSEMIDE 40 MG PO TABS: 60.0000 mg | ORAL_TABLET | Freq: Every day | ORAL | 3 refills | Status: DC

## 2018-08-07 MED ORDER — CLOPIDOGREL BISULFATE 75 MG PO TABS: ORAL_TABLET | ORAL | 3 refills | Status: AC

## 2018-08-07 MED ORDER — HYDRALAZINE HCL 50 MG PO TABS: 50.0000 mg | ORAL_TABLET | Freq: Two times a day (BID) | ORAL | 3 refills | Status: AC

## 2018-08-07 MED ORDER — ISOSORBIDE MONONITRATE ER 30 MG PO TB24: 30.0000 mg | ORAL_TABLET | Freq: Every day | ORAL | 3 refills | Status: DC

## 2018-08-07 NOTE — Progress Notes (Signed)
Cardiology Office Note  Date: 08/07/2018   ID: Teresa Hart, DOB 1951-09-18, MRN [id number]  PCP: Joyice Faster, FNP  Primary Cardiologist: Rozann Lesches, MD   Chief Complaint  Patient presents with  . Cardiac follow-up    History of Present Illness: Teresa Hart is a 67 y.o. female last seen in March.  She presents for a follow-up visit.  Records indicate recent hospital stay in August at [facility] in [place].  She presented with a progressive history of dyspnea on exertion and was diagnosed with combination of acute combined heart failure and NSTEMI as well as COPD exacerbation.  She was seen by the cardiology service at that facility and managed medically with noninvasive testing undertaken.  Of note, her troponin I level rose to 31.  I do not have a copy of her ECG for review.  It was described as not being consistent with STEMI.  Lexiscan Myoview done in [place] on 07/15/2018 was abnormal reporting a large fixed perfusion defect at the apex, distal anterior wall, basal and mid to distal inferolateral wall as well as mid to distal inferior wall.  No active ischemia described.  LVEF calculated at 29%.  Echocardiogram performed in [place] on 07/13/2018 reported LVEF 35 to 40%, normal right ventricular contraction, mild mitral regurgitation, trace tricuspid regurgitation, PASP 38 mmHg.  Akinetic wall segments described in the apical and distal anterior wall as well as basal inferior wall.  She presents today for follow-up.  She tells me that she remains chronically short of breath, although has had no obvious angina symptoms.  She is on supplemental oxygen via nasal cannula.  She has been checking daily weights at home, these have fluctuated within a range of 5 pounds, she is currently taking more of her standing Lasix in the last few days.  Cardiac catheterization in June 2018 revealed patent RCA stent sites, nonobstructive disease within the LAD, diffusely diseased  obtuse marginal system, and culprit lesion felt to be mid RCA which was occluded and associated with left to right collaterals. This was managed medically.  I personally reviewed her ECG today which shows sinus rhythm with IVCD and possible old anterior infarct pattern, nonspecific T wave changes.  Today we went over her medications which are reasonable from the perspective of ischemic cardiomyopathy with renal insufficiency.  Not a good candidate for ARB or Entresto.  I have asked her to increase her Lasix to 60 mg daily on a standing basis.  At follow-up we will plan to be met in an echocardiogram for reassessment.  Past Medical History:  Diagnosis Date  . Aortic stenosis, mild    a. per echo Jan 2013; EF is 61%  . Asthma   . CAD (coronary artery disease)    a. 11/2011 abnl MV; b. 11/2011 Cath: No obstructive disease in the large caliber vessels, disease in the small OM2 and distal LAD, not amenable to PCI or grafting; c. 01/2016 PCI: LM nl, LAD 30ost, 38m LCX nl, OM1 small, OM2 90 diff, OM3 small, RCA  95p/m 2.75 x 24 Promus Premier DES, 447mSevere OM2 managed medically.  . Cardiac enlargement   . Carotid stenosis, bilateral    a. per doppler Jan 2013; 6004-88%ICA, 4089-16%ICA  . CKD (chronic kidney disease), stage III ([place]  . COPD (chronic obstructive pulmonary disease) ([place]  . GERD (gastroesophageal reflux disease)   . Hyperlipidemia   . Hypertensive heart disease   . Hypothyroid   .  Obese   . Type I diabetes mellitus ([place])     Past Surgical History:  Procedure Laterality Date  . ABDOMINAL HYSTERECTOMY    . APPENDECTOMY    . CARDIAC CATHETERIZATION  Jan 2013   No major obstructive disease in the large caliber vessels, with disease in a small OM2 and distal LAD; not amenable to PCI or grafting.   Marland Kitchen CARDIAC CATHETERIZATION N/A 02/05/2016   Procedure: Left Heart Cath and Coronary Angiography;  Surgeon: Peter M Martinique, MD;  Location: [facility];  Service: Cardiovascular;   Laterality: N/A;  . CARDIAC CATHETERIZATION  02/05/2016   Procedure: Coronary Stent Intervention;  Surgeon: Peter M Martinique, MD;  Location: [facility] [place] [facility];  Service: Cardiovascular;;  . CARDIAC CATHETERIZATION N/A 02/20/2016   Procedure: Coronary Stent Intervention;  Surgeon: Belva Crome, MD;  Location: [place] [facility];  Service: Cardiovascular;  Laterality: N/A;  . CATARACT EXTRACTION  08/2016  . KNEE ARTHROSCOPY  06/24/2012   Procedure: ARTHROSCOPY KNEE;  Surgeon: Kerin Salen, MD;  Location: [facility];  Service: Orthopedics;  Laterality: Right;  DEBRIDEMENT OF CHONDROMALACIA  . KNEE SURGERY    . RIGHT/LEFT HEART CATH AND CORONARY ANGIOGRAPHY N/A 05/12/2017   Procedure: Right/Left Heart Cath and Coronary Angiography;  Surgeon: Jettie Booze, MD;  Location: [place] [facility];  Service: Cardiovascular;  Laterality: N/A;  . TONSILLECTOMY      Current Outpatient Medications  Medication Sig Dispense Refill  . ACCU-CHEK AVIVA PLUS test strip CHECK BLOOD SUGAR 4 TIMES DAILY. 450 each 2  . albuterol (PROVENTIL HFA;VENTOLIN HFA) 108 (90 BASE) MCG/ACT inhaler Inhale 2 puffs into the lungs every 6 (six) hours as needed. 18 g 2  . ARIPiprazole (ABILIFY) 20 MG tablet Take 0.5 tablets (10 mg total) by mouth every evening. 90 tablet 1  . aspirin EC 81 MG tablet Take 1 tablet (81 mg total) by mouth daily. 150 tablet 2  . atorvastatin (LIPITOR) 40 MG tablet TAKE 1 TABLET BY MOUTH AT BEDTIME FOR CHOLESTEROL 90 tablet 3  . Cholecalciferol (VITAMIN D3) 5000 units CAPS Take 5,000 Units by mouth daily.    . clopidogrel (PLAVIX) 75 MG tablet Take 75 mg by mouth once a day 90 tablet 3  . diazepam (VALIUM) 5 MG tablet Take 1 tablet (5 mg total) by mouth daily as needed for anxiety. 90 tablet 0  . famotidine (PEPCID) 20 MG tablet Take 20 mg by mouth daily.     . Fluticasone-Umeclidin-Vilant (TRELEGY ELLIPTA) 100-62.5-25 MCG/INH AEPB Inhale daily into the lungs.    . furosemide (LASIX) 40  MG tablet Take 1.5 tablets (60 mg total) by mouth daily. 135 tablet 3  . hydrALAZINE (APRESOLINE) 50 MG tablet Take 50 mg by mouth 2 (two) times daily.    . Insulin Pen Needle (B-D ULTRAFINE III SHORT PEN) 31G X 8 MM MISC 1 each by Does not apply route as directed. 150 each 3  . insulin regular human CONCENTRATED (HUMULIN R) 500 UNIT/ML kwikpen Use as directed. Max daily dose is 300 units.    . isosorbide mononitrate (IMDUR) 30 MG 24 hr tablet Take 30 mg by mouth daily.    Marland Kitchen levocetirizine (XYZAL) 5 MG tablet Take 1 tablet (5 mg total) by mouth every evening. (Patient taking differently: Take 5 mg by mouth daily. ) 30 tablet 5  . Liraglutide (VICTOZA) 18 MG/3ML SOPN Inject 0.3 mLs (1.8 mg total) into the skin at bedtime. 27 pen 0  .  metoprolol succinate (TOPROL-XL) 50 MG 24 hr tablet Take 1 tablet (50 mg total) by mouth at bedtime. (Patient taking differently: Take 50 mg by mouth at bedtime. Takes 1 1/2 tablets hs  (75 mg)) 90 tablet 3  . nitroGLYCERIN (NITROSTAT) 0.4 MG SL tablet Place 1 tablet (0.4 mg total) under the tongue every 5 (five) minutes as needed for chest pain. 25 tablet 3  . OXYGEN Inhale 3 mLs into the lungs.    . pantoprazole (PROTONIX) 40 MG tablet Take 1 tablet (40 mg total) by mouth daily. 90 tablet 3  . vortioxetine HBr (TRINTELLIX) 20 MG TABS tablet Take 1 tablet (20 mg total) by mouth daily. 90 tablet 1  . HYDROcodone-acetaminophen (NORCO/VICODIN) 5-325 MG tablet Take one tab po q 4 hrs prn pain (Patient not taking: Reported on 08/07/2018) 10 tablet 0   No current facility-administered medications for this visit.    Allergies:  Brilinta [ticagrelor]; Cinnamon; Fish-derived products; Other; Penicillins; Strawberry extract; Amoxicillin-pot clavulanate; Crestor [rosuvastatin]; and Levaquin [levofloxacin]   Social History: The patient  reports that she has never smoked. She has never used smokeless tobacco. She reports that she does not drink alcohol or use drugs.   ROS:  Please  see the history of present illness. Otherwise, complete review of systems is positive for fatigue, chronic dyspnea on exertion..  All other systems are reviewed and negative.   Physical Exam: VS:  BP (!) 113/56   Pulse 88   Ht 5' 5"  (1.651 m)   Wt 214 lb (97.1 kg)   SpO2 90%   BMI 35.61 kg/m , BMI Body mass index is 35.61 kg/m.  Wt Readings from Last 3 Encounters:  08/07/18 214 lb (97.1 kg)  02/23/18 212 lb (96.2 kg)  01/23/18 213 lb (96.6 kg)    General: Overweight woman, wearing oxygen via nasal cannula. HEENT: Conjunctiva and lids normal, oropharynx clear. Neck: Supple, increased girth, difficult to assess JVP, no carotid bruits. Lungs: Decreased breath sounds without wheezing, nonlabored breathing at rest. Cardiac: Regular rate and rhythm, no S3 or significant systolic murmur. Abdomen: Obese, nontender, bowel sounds present. Extremities: No pitting edema, distal pulses 2+. Skin: Warm and dry. Musculoskeletal: No kyphosis. Neuropsychiatric: Alert and oriented x3, affect grossly appropriate.  ECG: I personally reviewed the tracing from 05/11/2017 which showed sinus rhythm with anterolateral ST-T wave abnormalities consistent with ischemia.  Recent Labwork: 07/09/2018: BUN 27; Creatinine, Ser 2.10; Hemoglobin 9.9; Potassium 4.0; Sodium 141     Component Value Date/Time   CHOL 154 06/11/2016 1007   TRIG 174 (H) 06/11/2016 1007   HDL 47 06/11/2016 1007   CHOLHDL 3.3 06/11/2016 1007   VLDL 35 (H) 06/11/2016 1007   LDLCALC 72 06/11/2016 1007  August 2019: Hemoglobin 8.3, platelets 249, BUN 42, creatinine 2.36, potassium 4.0, AST 62, ALT 51, peak troponin I 31.2  Other Studies Reviewed Today:  Recent echocardiogram and Lexiscan Myoview results from [place] are summarized above.  Assessment and Plan:  1.  Ischemic cardiomyopathy, LVEF 35 to 40% by echocardiogram in [place] back in August in the setting of significant NSTEMI and troponin I up to 31.  I do not have the actual  ECG for review.  She does not report any active angina but has chronic shortness of breath and requires supplemental oxygen with associated chronic lung disease as well.  I reviewed her cardiac medications.  Agree with addition of Imdur and increase in Toprol-XL dose.  Also increase Lasix to 60 mg daily.  We will have  her follow-up in the next 3 to 4 weeks with BMET and a repeat echocardiogram.  2.  Ischemic heart disease with cardiac catheterization from June 2018 showing 60% mid LAD stenosis, severe OM 2 disease that was managed medically, occluded mid RCA with left-to-right collaterals, and other small vessel disease without revascularization options.  She may have occluded vessel in the OM distribution most recently or even potentially LAD.  Plan is to continue medical therapy depending on symptoms overall.  Cardiac catheterization would carry increased risk of contrast nephropathy, but will plan to follow-up on BMET for reevaluation.  Continue dual antiplatelet therapy.  3.  Mixed hyperlipidemia, on Lipitor.  4.  Essential hypertension, blood pressure is well controlled today on current regimen.  5.  Chronic hypoxic respiratory failure with COPD, on supplemental oxygen and follows with pulmonologist.  6. CKD stage 3.  Current medicines were reviewed with the patient today.   Orders Placed This Encounter  Procedures  . Basic Metabolic Panel (BMET)  . EKG 12-Lead  . ECHOCARDIOGRAM COMPLETE    Disposition: Follow-up in 3 to 4 weeks.  Signed, Teresa Sark, MD, [facility] 08/07/2018 2:04 PM    [facility] at [facility] [address] [place], [place] [address] Phone: [phone]; Fax: [phone]

## 2018-08-07 NOTE — Addendum Note (Signed)
Addended by: Marlyn CorporalARLTON, Rosette Bellavance A on: 08/07/2018 02:15 PM   Modules accepted: Orders

## 2018-08-07 NOTE — Patient Instructions (Addendum)
Your physician wants you to follow-up in: 3-4 weeks with [place]ada[place]ittany Strader PA-C  INCREASE Lasix to 60 mg daily  If you need a refill on your cardiac medications before your next appointment, please call your pharmacy.     Your physician has requested that you have an echocardiogram at next visit. Echocardiography is a painless test that uses sound waves to create images of your heart. It provides your doctor with information about the size and shape of your heart and how well your heart's chambers and valves are working. This procedure takes approximately one hour. There are no restrictions for this procedure.   Get lab work :BMET at next visit      Thank you for choosing [facility] !

## 2018-08-10 ENCOUNTER — Ambulatory Visit (HOSPITAL_COMMUNITY): Payer: Self-pay | Admitting: Psychiatry

## 2018-08-11 ENCOUNTER — Telehealth: Payer: Self-pay | Admitting: Cardiology

## 2018-08-11 NOTE — Telephone Encounter (Signed)
I will forward to Dr.McDowell for dispo. 

## 2018-08-11 NOTE — Telephone Encounter (Signed)
Pt is very winded, she saw her kidney Dr. Maurice SmallLast Friday and she would like to go ahead and go ahead and schedule her cath

## 2018-08-12 MED ORDER — FUROSEMIDE 40 MG PO TABS: 80.0000 mg | ORAL_TABLET | Freq: Every day | ORAL | 3 refills | Status: DC

## 2018-08-12 NOTE — Telephone Encounter (Signed)
Please see my recent office note.  Cardiac catheterization is not something that we should take lightly at this time in light of her renal insufficiency and risk of contrast nephropathy.  The myocardial perfusion study done in [place] showed scar without active ischemia, and even if we do a cardiac catheterization, there may not be a culprit lesion to stent.  She was due to have a follow-up BMET anyway as well as an echocardiogram to reassess LVEF with close office follow-up.  We really need to review this further in the office prior to making a decision.

## 2018-08-12 NOTE — Telephone Encounter (Signed)
Pt states nephrologist increased lasix to 80 mg daily.She agrees to wait for f/u apt after echo

## 2018-08-24 ENCOUNTER — Encounter (HOSPITAL_COMMUNITY)
Admission: RE | Admit: 2018-08-24 | Discharge: 2018-08-24 | Disposition: A | Discharge status: Home / Self Care | Payer: Medicare Other | Source: Ambulatory Visit | Attending: Nephrology | Admitting: Nephrology

## 2018-08-24 DIAGNOSIS — D631 Anemia in chronic kidney disease: Secondary | ICD-10-CM | POA: Diagnosis present

## 2018-08-24 DIAGNOSIS — N189 Chronic kidney disease, unspecified: Secondary | ICD-10-CM | POA: Diagnosis not present

## 2018-08-24 MED ORDER — SODIUM CHLORIDE 0.9 % IV SOLN
INTRAVENOUS | Status: DC
  Administered 2018-08-24: 250 mL via INTRAVENOUS

## 2018-08-24 MED ORDER — SODIUM CHLORIDE 0.9 % IV SOLN
510.0000 mg | Freq: Once | INTRAVENOUS | Status: AC
  Administered 2018-08-24: 510 mg via INTRAVENOUS
  Filled 2018-08-24: qty 17

## 2018-08-31 ENCOUNTER — Encounter (HOSPITAL_COMMUNITY)
Admission: RE | Admit: 2018-08-31 | Discharge: 2018-08-31 | Disposition: A | Discharge status: Home / Self Care | Payer: Medicare Other | Source: Ambulatory Visit | Attending: Nephrology | Admitting: Nephrology

## 2018-08-31 DIAGNOSIS — N189 Chronic kidney disease, unspecified: Secondary | ICD-10-CM | POA: Diagnosis not present

## 2018-08-31 MED ORDER — SODIUM CHLORIDE 0.9 % IV SOLN
Freq: Once | INTRAVENOUS | Status: AC
  Administered 2018-08-31: 14:00:00 via INTRAVENOUS

## 2018-08-31 MED ORDER — SODIUM CHLORIDE 0.9 % IV SOLN
510.0000 mg | Freq: Once | INTRAVENOUS | Status: AC
  Administered 2018-08-31: 510 mg via INTRAVENOUS
  Filled 2018-08-31: qty 17
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
## 2018-09-03 ENCOUNTER — Ambulatory Visit (INDEPENDENT_AMBULATORY_CARE_PROVIDER_SITE_OTHER): Payer: Medicare Other | Admitting: Psychiatry

## 2018-09-03 ENCOUNTER — Encounter (HOSPITAL_COMMUNITY): Payer: Self-pay | Admitting: Psychiatry

## 2018-09-03 VITALS — BP 117/67 | HR 90 | Ht 65.0 in | Wt 215.0 lb

## 2018-09-03 DIAGNOSIS — F3342 Major depressive disorder, recurrent, in full remission: Secondary | ICD-10-CM

## 2018-09-03 DIAGNOSIS — G4700 Insomnia, unspecified: Secondary | ICD-10-CM

## 2018-09-03 DIAGNOSIS — F419 Anxiety disorder, unspecified: Secondary | ICD-10-CM | POA: Diagnosis not present

## 2018-09-03 MED ORDER — ARIPIPRAZOLE 20 MG PO TABS: 10.0000 mg | ORAL_TABLET | Freq: Every evening | ORAL | 1 refills | Status: DC

## 2018-09-03 MED ORDER — DIAZEPAM 5 MG PO TABS: 5.0000 mg | ORAL_TABLET | Freq: Every day | ORAL | 1 refills | Status: DC | PRN

## 2018-09-03 MED ORDER — VORTIOXETINE HBR 20 MG PO TABS: 20.0000 mg | ORAL_TABLET | Freq: Every day | ORAL | 1 refills | Status: DC

## 2018-09-03 NOTE — Patient Instructions (Addendum)
1. Continue Trintellix 20 mg daily  2. Continue Abilify 10 mg daily  3. Continue Valium 5 mg daily as needed for anxiety  4. Return to clinic insixmonthsfor 15 mins 

## 2018-09-09 ENCOUNTER — Ambulatory Visit: Payer: Self-pay | Admitting: Student

## 2018-09-09 ENCOUNTER — Other Ambulatory Visit (HOSPITAL_COMMUNITY): Payer: Self-pay

## 2018-09-09 NOTE — Progress Notes (Deleted)
Cardiology Office Note    Date:  09/09/2018   ID:  Teresa Hart, DOB 1951/02/27, MRN [id number]  PCP:  Altamease Oiler, FNP  Cardiologist: Nona Dell, MD    No chief complaint on file.   History of Present Illness:    Teresa Hart is a 67 y.o. female with past medical history of CAD (s/p DES to Proximal RCA in 2017, cath in 04/2017 showing patent stent with occluded mid-RCA with L-->R collaterals present and 2nd Mrg stenosis with medical management recommended), HTN, HLD, Stage 3 CKD and COPD who presents to the office today for one-month follow-up.  She was recently examined by Dr. Diona Hart in 07/2018 following a recent hospitalization at [facility] in [place]. Had been admitted for evaluation of dyspnea on exertion and was found to have an NSTEMI and COPD exacerbation with troponin levels significantly increasing to 31. A Lexiscan Myoview was performed in the setting of her renal dysfunction and showed a large fixed perfusion defect at the apex, distal anterior wall, and inferior lateral wall with no active ischemia noted. Echocardiogram showed an EF of 35 to 40% and akinetic wall segments along the apical and distal anterior walls. At the time of her office visit, she reported continual shortness of breath and had been on supplemental oxygen since hospital discharge. Lasix was titrated to 60 mg daily as she did have some evidence of volume overload by exam and reported a weight gain of 5 pounds. A repeat echocardiogram was recommended for further assessment with an overall plan of continued medical therapy as a cardiac catheterization would put her at risk of contrast-induced nephropathy.   Past Medical History:  Diagnosis Date  . Aortic stenosis, mild    a. per echo Jan 2013; EF is 61%  . Asthma   . CAD (coronary artery disease)    a. 11/2011 abnl MV; b. 11/2011 Cath: No obstructive disease in the large caliber vessels, disease in the small OM2 and distal LAD, not  amenable to PCI or grafting; c. 01/2016 PCI: LM nl, LAD 30ost, 14m, LCX nl, OM1 small, OM2 90 diff, OM3 small, RCA  95p/m 2.75 x 24 Promus Premier DES, 61m. Severe OM2 managed medically.  . Cardiac enlargement   . Carotid stenosis, bilateral    a. per doppler Jan 2013; 60-79% RICA, 40-59% LICA  . CKD (chronic kidney disease), stage III (HCC)   . COPD (chronic obstructive pulmonary disease) (HCC)   . GERD (gastroesophageal reflux disease)   . Hyperlipidemia   . Hypertensive heart disease   . Hypothyroid   . Obese   . Type I diabetes mellitus (HCC)     Past Surgical History:  Procedure Laterality Date  . ABDOMINAL HYSTERECTOMY    . APPENDECTOMY    . CARDIAC CATHETERIZATION  Jan 2013   No major obstructive disease in the large caliber vessels, with disease in a small OM2 and distal LAD; not amenable to PCI or grafting.   Marland Kitchen CARDIAC CATHETERIZATION N/A 02/05/2016   Procedure: Left Heart Cath and Coronary Angiography;  Surgeon: Peter M Swaziland, MD;  Location[facility] INVASIVE CV LAB;  Service: Cardiovascular;  Laterality: N/A;  . CARDIAC CATHETERIZATION  02/05/2016   Procedure: Coronary Stent Intervention;  Surgeon: Peter M Swaziland, MD;  Location: [facility] INVASIVE CV LAB;  Service: Cardiovascular;;  . CARDIAC CATHETERIZATION N/A 02/20/2016   Procedure: Coronary Stent Intervention;  Surgeon: Lyn Records, MD;  Location: [facility] INVASIVE CV LAB;  Service: Cardiovascular;  Laterality: N/A;  .  CATARACT EXTRACTION  08/2016  . KNEE ARTHROSCOPY  06/24/2012   Procedure: ARTHROSCOPY KNEE;  Surgeon: Nestor Lewandowsky, MD;  Location: [facility];  Service: Orthopedics;  Laterality: Right;  DEBRIDEMENT OF CHONDROMALACIA  . KNEE SURGERY    . RIGHT/LEFT HEART CATH AND CORONARY ANGIOGRAPHY N/A 05/12/2017   Procedure: Right/Left Heart Cath and Coronary Angiography;  Surgeon: Corky Crafts, MD;  Location: [facility] INVASIVE CV LAB;  Service: Cardiovascular;  Laterality: N/A;  . TONSILLECTOMY      Current  Medications: Outpatient Medications Prior to Visit  Medication Sig Dispense Refill  . ACCU-CHEK AVIVA PLUS test strip CHECK BLOOD SUGAR 4 TIMES DAILY. 450 each 2  . albuterol (PROVENTIL HFA;VENTOLIN HFA) 108 (90 BASE) MCG/ACT inhaler Inhale 2 puffs into the lungs every 6 (six) hours as needed. 18 g 2  . ARIPiprazole (ABILIFY) 20 MG tablet Take 0.5 tablets (10 mg total) by mouth every evening. 90 tablet 1  . aspirin EC 81 MG tablet Take 1 tablet (81 mg total) by mouth daily. 150 tablet 2  . atorvastatin (LIPITOR) 40 MG tablet TAKE 1 TABLET BY MOUTH AT BEDTIME FOR CHOLESTEROL 90 tablet 3  . Cholecalciferol (VITAMIN D3) 5000 units CAPS Take 5,000 Units by mouth daily.    . clopidogrel (PLAVIX) 75 MG tablet Take 75 mg by mouth once a day 90 tablet 3  . [START ON 10/02/2018] diazepam (VALIUM) 5 MG tablet Take 1 tablet (5 mg total) by mouth daily as needed for anxiety. 90 tablet 1  . famotidine (PEPCID) 20 MG tablet Take 20 mg by mouth daily.     . Fluticasone-Umeclidin-Vilant (TRELEGY ELLIPTA) 100-62.5-25 MCG/INH AEPB Inhale daily into the lungs.    . furosemide (LASIX) 40 MG tablet Take 2 tablets (80 mg total) by mouth daily. 135 tablet 3  . hydrALAZINE (APRESOLINE) 50 MG tablet Take 1 tablet (50 mg total) by mouth 2 (two) times daily. 180 tablet 3  . HYDROcodone-acetaminophen (NORCO/VICODIN) 5-325 MG tablet Take one tab po q 4 hrs prn pain 10 tablet 0  . Insulin Pen Needle (B-D ULTRAFINE III SHORT PEN) 31G X 8 MM MISC 1 each by Does not apply route as directed. 150 each 3  . insulin regular human CONCENTRATED (HUMULIN R) 500 UNIT/ML kwikpen Use as directed. Max daily dose is 300 units.    . isosorbide mononitrate (IMDUR) 30 MG 24 hr tablet Take 1 tablet (30 mg total) by mouth daily. 90 tablet 3  . levocetirizine (XYZAL) 5 MG tablet Take 1 tablet (5 mg total) by mouth every evening. (Patient taking differently: Take 5 mg by mouth daily. ) 30 tablet 5  . Liraglutide (VICTOZA) 18 MG/3ML SOPN Inject 0.3  mLs (1.8 mg total) into the skin at bedtime. 27 pen 0  . metoprolol succinate (TOPROL-XL) 50 MG 24 hr tablet Take 1 tablet (50 mg total) by mouth at bedtime. (Patient taking differently: Take 50 mg by mouth at bedtime. Takes 1 1/2 tablets hs  (75 mg)) 90 tablet 3  . nitroGLYCERIN (NITROSTAT) 0.4 MG SL tablet Place 1 tablet (0.4 mg total) under the tongue every 5 (five) minutes as needed for chest pain. 25 tablet 3  . OXYGEN Inhale 3 mLs into the lungs.    . pantoprazole (PROTONIX) 40 MG tablet Take 1 tablet (40 mg total) by mouth daily. 90 tablet 3  . vortioxetine HBr (TRINTELLIX) 20 MG TABS tablet Take 1 tablet (20 mg total) by mouth daily. 90 tablet 1   No facility-administered  medications prior to visit.      Allergies:   Brilinta [ticagrelor]; Cinnamon; Fish-derived products; Other; Penicillins; Strawberry extract; Amoxicillin-pot clavulanate; Crestor [rosuvastatin]; and Levaquin [levofloxacin]   Social History   Socioeconomic History  . Marital status: Married    Spouse name: Not on file  . Number of children: 0  . Years of education: Not on file  . Highest education level: Not on file  Occupational History  . Occupation: Disabled  Social Needs  . Financial resource strain: Not on file  . Food insecurity:    Worry: Not on file    Inability: Not on file  . Transportation needs:    Medical: Not on file    Non-medical: Not on file  Tobacco Use  . Smoking status: Never Smoker  . Smokeless tobacco: Never Used  Substance and Sexual Activity  . Alcohol use: No  . Drug use: No  . Sexual activity: Yes  Lifestyle  . Physical activity:    Days per week: Not on file    Minutes per session: Not on file  . Stress: Not on file  Relationships  . Social connections:    Talks on phone: Not on file    Gets together: Not on file    Attends religious service: Not on file    Active member of club or organization: Not on file    Attends meetings of clubs or organizations: Not on file     Relationship status: Not on file  Other Topics Concern  . Not on file  Social History Narrative   Lives with husband, disabled from multiple medical issues.     Family History:  The patient's ***family history includes Coronary artery disease in her brother and brother; Heart disease in her brother.   Review of Systems:   Please see the history of present illness.     General:  No chills, fever, night sweats or weight changes.  Cardiovascular:  No chest pain, dyspnea on exertion, edema, orthopnea, palpitations, paroxysmal nocturnal dyspnea. Dermatological: No rash, lesions/masses Respiratory: No cough, dyspnea Urologic: No hematuria, dysuria Abdominal:   No nausea, vomiting, diarrhea, bright red blood per rectum, melena, or hematemesis Neurologic:  No visual changes, wkns, changes in mental status. All other systems reviewed and are otherwise negative except as noted above.   Physical Exam:    VS:  There were no vitals taken for this visit.   General: Well developed, well nourished,female appearing in no acute distress. Head: Normocephalic, atraumatic, sclera non-icteric, no xanthomas, nares are without discharge.  Neck: No carotid bruits. JVD not elevated.  Lungs: Respirations regular and unlabored, without wheezes or rales.  Heart: ***Regular rate and rhythm. No S3 or S4.  No murmur, no rubs, or gallops appreciated. Abdomen: Soft, non-tender, non-distended with normoactive bowel sounds. No hepatomegaly. No rebound/guarding. No obvious abdominal masses. Msk:  Strength and tone appear normal for age. No joint deformities or effusions. Extremities: No clubbing or cyanosis. No edema.  Distal pedal pulses are 2+ bilaterally. Neuro: Alert and oriented X 3. Moves all extremities spontaneously. No focal deficits noted. Psych:  Responds to questions appropriately with a normal affect. Skin: No rashes or lesions noted  Wt Readings from Last 3 Encounters:  08/07/18 214 lb (97.1 kg)   02/23/18 212 lb (96.2 kg)  01/23/18 213 lb (96.6 kg)        Studies/Labs Reviewed:   EKG:  EKG is*** ordered today.  The ekg ordered today demonstrates ***  Recent Labs: 07/09/2018: BUN  27; Creatinine, Ser 2.10; Hemoglobin 9.9; Potassium 4.0; Sodium 141   Lipid Panel    Component Value Date/Time   CHOL 154 06/11/2016 1007   TRIG 174 (H) 06/11/2016 1007   HDL 47 06/11/2016 1007   CHOLHDL 3.3 06/11/2016 1007   VLDL 35 (H) 06/11/2016 1007   LDLCALC 72 06/11/2016 1007    Additional studies/ records that were reviewed today include:   Cardiac Catheterization: 04/2017  Patent proximal to mid RCA stent.  Ost LAD to Prox LAD lesion, 30 %stenosed.  Mid LAD to Dist LAD lesion, 60 %stenosed.  Ost 2nd Mrg to 2nd Mrg lesion, 90 %stenosed.  2nd Mrg lesion, 99 %stenosed. Diffusely diseased vessel.  Mid RCA lesion, 100 %stenosed. This is the culprit. There are left to right collaterals.  LV end diastolic pressure is moderately elevated.  There is no aortic valve stenosis.  Hemodynamic findings consistent with mild to moderate pulmonary hypertension.  CO 5.6 L/min. CI 2.7. PA sat 64 %. Unable to wedge the catheter.   Occluded RCA appears to be the cause of her positive troponin.  Her SHOB is from volume overload and her elevated LVEDP.  No chest pain in over 24 hours.  No benefit from revascularization at this point.    Continue diuresis with careful check of her renal function.  Aggressive secondary prevention.  Echocardiogram: 04/2017 Study Conclusions  - Left ventricle: The cavity size was normal. Wall thickness was   increased in a pattern of moderate LVH. Systolic function was   vigorous. The estimated ejection fraction was in the range of 65%   to 70%. Wall motion was normal; there were no regional wall   motion abnormalities. Doppler parameters are consistent with   pseudonormal left ventricular relaxation (grade 2 diastolic   dysfunction). The E/e&' ratio is  >20, suggesting elevated LV   filling pressure. - Aortic valve: Poorly visualized. Mildly calcified leaflets. Mild   stenosis. Valve area (VTI): 1.63 cm^2. Valve area (Vmax): 1.71   cm^2. Valve area (Vmean): 1.65 cm^2. - Left atrium: The atrium was normal in size. - Right ventricle: RVH is noted. Systolic function is normal - the   ventricle is not dilated. Lateral annulus peak S velocity: 11.3   cm/s. - Right atrium: The atrium was normal in size. - Pulmonic valve: Poorly visualized. Elevated gradient consistent   with mild stenosis. - Inferior vena cava: The vessel was normal in size. The   respirophasic diameter changes were in the normal range (>= 50%),   consistent with normal central venous pressure.  Impressions:  - Compared to a prior study in 01/2016, there are few changes. There   is mild aortic and pulmonic stenosis, RVH is noted. Grade 2 DD   with elevated LV filling pressure is present.   Assessment:    No diagnosis found.   Plan:   In order of problems listed above:  1. ***    Medication Adjustments/Labs and Tests Ordered: Current medicines are reviewed at length with the patient today.  Concerns regarding medicines are outlined above.  Medication changes, Labs and Tests ordered today are listed in the Patient Instructions below. There are no Patient Instructions on file for this visit.   Signed, Ellsworth Lennox, PA-C  09/09/2018 7:35 AM    [facility] [address] [place], [place] [address] Phone: [phone]

## 2018-09-15 ENCOUNTER — Other Ambulatory Visit (HOSPITAL_COMMUNITY): Payer: Self-pay

## 2018-09-21 ENCOUNTER — Other Ambulatory Visit (HOSPITAL_COMMUNITY)
Admission: RE | Admit: 2018-09-21 | Discharge: 2018-09-21 | Disposition: A | Discharge status: Home / Self Care | Payer: Medicare Other | Source: Ambulatory Visit | Attending: Cardiology | Admitting: Cardiology

## 2018-09-21 ENCOUNTER — Ambulatory Visit (HOSPITAL_COMMUNITY)
Admission: RE | Admit: 2018-09-21 | Discharge: 2018-09-21 | Disposition: A | Discharge status: Home / Self Care | Payer: Medicare Other | Source: Ambulatory Visit | Attending: Cardiology | Admitting: Cardiology

## 2018-09-21 DIAGNOSIS — I255 Ischemic cardiomyopathy: Secondary | ICD-10-CM | POA: Diagnosis present

## 2018-09-21 LAB — BASIC METABOLIC PANEL
ANION GAP: 9 (ref 5–15)
BUN: 25 mg/dL — AB (ref 8–23)
CALCIUM: 9.8 mg/dL (ref 8.9–10.3)
CO2: 32 mmol/L (ref 22–32)
Chloride: 97 mmol/L — ABNORMAL LOW (ref 98–111)
Creatinine, Ser: 1.76 mg/dL — ABNORMAL HIGH (ref 0.44–1.00)
GFR calc Af Amer: 34 mL/min — ABNORMAL LOW (ref >60)
GFR, EST NON AFRICAN AMERICAN: 29 mL/min — AB (ref >60)
Glucose, Bld: 190 mg/dL — ABNORMAL HIGH (ref 70–99)
POTASSIUM: 4.5 mmol/L (ref 3.5–5.1)
SODIUM: 138 mmol/L (ref 135–145)

## 2018-09-21 NOTE — Progress Notes (Signed)
*  PRELIMINARY RESULTS* Echocardiogram 2D Echocardiogram has been performed.  Stacey Drain 09/21/2018, 12:27 PM

## 2018-10-07 ENCOUNTER — Other Ambulatory Visit: Payer: Self-pay | Admitting: Physician Assistant

## 2018-10-07 DIAGNOSIS — Z1231 Encounter for screening mammogram for malignant neoplasm of breast: Secondary | ICD-10-CM

## 2018-10-09 ENCOUNTER — Ambulatory Visit: Payer: Self-pay

## 2018-10-15 ENCOUNTER — Other Ambulatory Visit: Payer: Self-pay | Admitting: Cardiology

## 2018-10-21 ENCOUNTER — Ambulatory Visit: Payer: Medicare Other | Admitting: Student

## 2018-10-21 ENCOUNTER — Encounter: Payer: Self-pay | Admitting: Student

## 2018-10-21 VITALS — BP 110/58 | HR 96 | Ht 65.0 in | Wt 219.4 lb

## 2018-10-21 DIAGNOSIS — I5042 Chronic combined systolic (congestive) and diastolic (congestive) heart failure: Secondary | ICD-10-CM

## 2018-10-21 DIAGNOSIS — I350 Nonrheumatic aortic (valve) stenosis: Secondary | ICD-10-CM

## 2018-10-21 DIAGNOSIS — I255 Ischemic cardiomyopathy: Secondary | ICD-10-CM | POA: Diagnosis not present

## 2018-10-21 DIAGNOSIS — E782 Mixed hyperlipidemia: Secondary | ICD-10-CM

## 2018-10-21 DIAGNOSIS — I25119 Atherosclerotic heart disease of native coronary artery with unspecified angina pectoris: Secondary | ICD-10-CM | POA: Diagnosis not present

## 2018-10-21 DIAGNOSIS — N183 Chronic kidney disease, stage 3 unspecified: Secondary | ICD-10-CM

## 2018-10-21 DIAGNOSIS — I10 Essential (primary) hypertension: Secondary | ICD-10-CM

## 2018-10-21 MED ORDER — FUROSEMIDE 40 MG PO TABS: 80.0000 mg | ORAL_TABLET | Freq: Every day | ORAL | 3 refills | Status: AC

## 2018-10-21 MED ORDER — ISOSORBIDE MONONITRATE ER 60 MG PO TB24: 60.0000 mg | ORAL_TABLET | Freq: Every day | ORAL | 3 refills | Status: DC

## 2018-10-21 MED ORDER — ISOSORBIDE MONONITRATE ER 60 MG PO TB24: 60.0000 mg | ORAL_TABLET | Freq: Every day | ORAL | 3 refills | Status: AC

## 2018-10-21 NOTE — Patient Instructions (Signed)
Medication Instructions:  INCREASE IMDUR TO 60 MG DAILY   Labwork: NONE  Testing/Procedures: NONE  Follow-Up: Your physician recommends that you schedule a follow-up appointment in: 2 MONTHS    Any Other Special Instructions Will Be Listed Below (If Applicable).     If you need a refill on your cardiac medications before your next appointment, please call your pharmacy.

## 2018-10-21 NOTE — Progress Notes (Signed)
Cardiology Office Note    Date:  10/21/2018   ID:  ANDRIAN URBACH, DOB Mar 22, 1951, MRN [id number]  PCP:  Altamease Oiler, FNP  Cardiologist: Nona Dell, MD    Chief Complaint  Patient presents with  . Follow-up    3 month visit    History of Present Illness:    Teresa Hart is a 67 y.o. female with past medical history of CAD (s/p DES to Proximal RCA in 2017, cath in 04/2017 showing patent stent with occluded mid-RCA with L-->R collaterals present and 2nd Mrg stenosis with medical management recommended), HTN, HLD, Stage 3 CKD and COPD who presents to the office today for 61-month follow-up.   She was recently examined by Dr. Diona Browner in 07/2018 following a recent hospitalization at [facility] in [place]. Had been admitted for evaluation of dyspnea on exertion and was found to have an NSTEMI and COPD exacerbation with troponin levels significantly increasing to 31. A Lexiscan Myoview was performed in the setting of her renal dysfunction and showed a large fixed perfusion defect at the apex, distal anterior wall, and inferior lateral wall with no active ischemia noted. Echocardiogram showed an EF of 35 to 40% and akinetic wall segments along the apical and distal anterior walls. At the time of her office visit, she reported continual shortness of breath and had been on supplemental oxygen since hospital discharge. Lasix was titrated to 60 mg daily as she did have some evidence of volume overload by exam and reported a weight gain of 5 pounds. A repeat echocardiogram was recommended for further assessment with an overall plan of continued medical therapy as a cardiac catheterization would put her at risk of contrast-induced nephropathy.   In talking with the patient today, she reports having baseline dyspnea on exertion ever since her hospitalization earlier this year. She does feel like symptoms have overall been stable but reports getting short of breath when walking from room  to room in her house. She is on 4 L nasal cannula at baseline.  She denies any associated chest discomfort or palpitations. No recent orthopnea, PND, or lower extremity edema.  Her Nephrologist is Dr. Allena Katz and she reports Lasix was titrated from 60 mg daily to 80 mg daily several weeks ago. Reports good urine output with this and notes that weight has been stable at 211 - 212 lbs on her home scales.    Past Medical History:  Diagnosis Date  . Aortic stenosis, mild    a. per echo Jan 2013; EF is 61%  . Asthma   . CAD (coronary artery disease)    a. 11/2011 abnl MV; b. 11/2011 Cath: No obstructive disease in the large caliber vessels, disease in the small OM2 and distal LAD, not amenable to PCI or grafting; c. 01/2016 PCI: LM nl, LAD 30ost, 81m, LCX nl, OM1 small, OM2 90 diff, OM3 small, RCA  95p/m 2.75 x 24 Promus Premier DES, 53m. Severe OM2 managed medically.  . Cardiac enlargement   . Carotid stenosis, bilateral    a. per doppler Jan 2013; 60-79% RICA, 40-59% LICA  . CKD (chronic kidney disease), stage III (HCC)   . COPD (chronic obstructive pulmonary disease) (HCC)   . GERD (gastroesophageal reflux disease)   . Hyperlipidemia   . Hypertensive heart disease   . Hypothyroid   . Obese   . Type I diabetes mellitus (HCC)     Past Surgical History:  Procedure Laterality Date  . ABDOMINAL HYSTERECTOMY    .  APPENDECTOMY    . CARDIAC CATHETERIZATION  Jan 2013   No major obstructive disease in the large caliber vessels, with disease in a small OM2 and distal LAD; not amenable to PCI or grafting.   Marland Kitchen CARDIAC CATHETERIZATION N/A 02/05/2016   Procedure: Left Heart Cath and Coronary Angiography;  Surgeon: Peter M Swaziland, MD;  Location: [facility] INVASIVE CV LAB;  Service: Cardiovascular;  Laterality: N/A;  . CARDIAC CATHETERIZATION  02/05/2016   Procedure: Coronary Stent Intervention;  Surgeon: Peter M Swaziland, MD;  Location: [facility] INVASIVE CV LAB;  Service: Cardiovascular;;  . CARDIAC CATHETERIZATION N/A  02/20/2016   Procedure: Coronary Stent Intervention;  Surgeon: Lyn Records, MD;  Location: [facility] INVASIVE CV LAB;  Service: Cardiovascular;  Laterality: N/A;  . CATARACT EXTRACTION  08/2016  . KNEE ARTHROSCOPY  06/24/2012   Procedure: ARTHROSCOPY KNEE;  Surgeon: Nestor Lewandowsky, MD;  Location: [facility];  Service: Orthopedics;  Laterality: Right;  DEBRIDEMENT OF CHONDROMALACIA  . KNEE SURGERY    . RIGHT/LEFT HEART CATH AND CORONARY ANGIOGRAPHY N/A 05/12/2017   Procedure: Right/Left Heart Cath and Coronary Angiography;  Surgeon: Corky Crafts, MD;  Location: [facility] INVASIVE CV LAB;  Service: Cardiovascular;  Laterality: N/A;  . TONSILLECTOMY      Current Medications: Outpatient Medications Prior to Visit  Medication Sig Dispense Refill  . ACCU-CHEK AVIVA PLUS test strip CHECK BLOOD SUGAR 4 TIMES DAILY. 450 each 2  . albuterol (PROVENTIL HFA;VENTOLIN HFA) 108 (90 BASE) MCG/ACT inhaler Inhale 2 puffs into the lungs every 6 (six) hours as needed. 18 g 2  . ARIPiprazole (ABILIFY) 20 MG tablet Take 0.5 tablets (10 mg total) by mouth every evening. 90 tablet 1  . aspirin EC 81 MG tablet Take 1 tablet (81 mg total) by mouth daily. 150 tablet 2  . atorvastatin (LIPITOR) 40 MG tablet TAKE 1 TABLET BY MOUTH AT BEDTIME FOR CHOLESTEROL 90 tablet 3  . clopidogrel (PLAVIX) 75 MG tablet Take 75 mg by mouth once a day 90 tablet 3  . diazepam (VALIUM) 5 MG tablet Take 1 tablet (5 mg total) by mouth daily as needed for anxiety. 90 tablet 1  . famotidine (PEPCID) 20 MG tablet Take 20 mg by mouth daily as needed.     . Fluticasone-Umeclidin-Vilant (TRELEGY ELLIPTA) 100-62.5-25 MCG/INH AEPB Inhale daily into the lungs.    . hydrALAZINE (APRESOLINE) 50 MG tablet Take 1 tablet (50 mg total) by mouth 2 (two) times daily. 180 tablet 3  . Insulin Pen Needle (B-D ULTRAFINE III SHORT PEN) 31G X 8 MM MISC 1 each by Does not apply route as directed. 150 each 3  . insulin regular human CONCENTRATED (HUMULIN R)  500 UNIT/ML kwikpen Use as directed. Max daily dose is 300 units.    Marland Kitchen levocetirizine (XYZAL) 5 MG tablet Take 1 tablet (5 mg total) by mouth every evening. (Patient taking differently: Take 5 mg by mouth daily. ) 30 tablet 5  . Liraglutide (VICTOZA) 18 MG/3ML SOPN Inject 0.3 mLs (1.8 mg total) into the skin at bedtime. 27 pen 0  . metoprolol succinate (TOPROL-XL) 50 MG 24 hr tablet Take 1 tablet (50 mg total) by mouth at bedtime. Takes 1 1/2 tablets hs  (75 mg) 135 tablet 3  . nitroGLYCERIN (NITROSTAT) 0.4 MG SL tablet Place 1 tablet (0.4 mg total) under the tongue every 5 (five) minutes as needed for chest pain. 25 tablet 3  . OXYGEN Inhale 4 mLs into the lungs.     Marland Kitchen  pantoprazole (PROTONIX) 40 MG tablet Take 1 tablet (40 mg total) by mouth daily. 90 tablet 3  . vortioxetine HBr (TRINTELLIX) 20 MG TABS tablet Take 1 tablet (20 mg total) by mouth daily. 90 tablet 1  . furosemide (LASIX) 40 MG tablet Take 2 tablets (80 mg total) by mouth daily. 135 tablet 3  . isosorbide mononitrate (IMDUR) 30 MG 24 hr tablet Take 1 tablet (30 mg total) by mouth daily. 90 tablet 3  . Cholecalciferol (VITAMIN D3) 5000 units CAPS Take 5,000 Units by mouth daily.    Marland Kitchen HYDROcodone-acetaminophen (NORCO/VICODIN) 5-325 MG tablet Take one tab po q 4 hrs prn pain 10 tablet 0   No facility-administered medications prior to visit.      Allergies:   Brilinta [ticagrelor]; Cinnamon; Fish-derived products; Other; Penicillins; Strawberry extract; Amoxicillin-pot clavulanate; Crestor [rosuvastatin]; and Levaquin [levofloxacin]   Social History   Socioeconomic History  . Marital status: Married    Spouse name: Not on file  . Number of children: 0  . Years of education: Not on file  . Highest education level: Not on file  Occupational History  . Occupation: Disabled  Social Needs  . Financial resource strain: Not on file  . Food insecurity:    Worry: Not on file    Inability: Not on file  . Transportation needs:     Medical: Not on file    Non-medical: Not on file  Tobacco Use  . Smoking status: Never Smoker  . Smokeless tobacco: Never Used  Substance and Sexual Activity  . Alcohol use: No  . Drug use: No  . Sexual activity: Yes  Lifestyle  . Physical activity:    Days per week: Not on file    Minutes per session: Not on file  . Stress: Not on file  Relationships  . Social connections:    Talks on phone: Not on file    Gets together: Not on file    Attends religious service: Not on file    Active member of club or organization: Not on file    Attends meetings of clubs or organizations: Not on file    Relationship status: Not on file  Other Topics Concern  . Not on file  Social History Narrative   Lives with husband, disabled from multiple medical issues.     Family History:  The patient's family history includes Coronary artery disease in her brother and brother; Heart disease in her brother.   Review of Systems:   Please see the history of present illness.     General:  No chills, fever, night sweats or weight changes.  Cardiovascular:  No chest pain, edema, orthopnea, palpitations, paroxysmal nocturnal dyspnea. Positive for dyspnea on exertion.  Dermatological: No rash, lesions/masses Respiratory: No cough, dyspnea Urologic: No hematuria, dysuria Abdominal:   No nausea, vomiting, diarrhea, bright red blood per rectum, melena, or hematemesis Neurologic:  No visual changes, wkns, changes in mental status. All other systems reviewed and are otherwise negative except as noted above.   Physical Exam:    VS:  BP (!) 110/58   Pulse 96   Ht 5\' 5"  (1.651 m)   Wt 219 lb 6.4 oz (99.5 kg)   SpO2 91% Comment: on 4L O2 via [place]  BMI 36.51 kg/m    General: Well developed, well nourished Caucasian female appearing in no acute distress. Head: Normocephalic, atraumatic, sclera non-icteric, no xanthomas, nares are without discharge.  Neck: No carotid bruits. JVD not elevated.  Lungs:  Respirations  regular and unlabored, without wheezes or rales. On 4L [place] at baseline.  Heart: Regular rate and rhythm. No S3 or S4.  No rubs or gallops appreciated. 2/6 SEM along RUSB.  Abdomen: Soft, non-tender, non-distended with normoactive bowel sounds. No hepatomegaly. No rebound/guarding. No obvious abdominal masses. Msk:  Strength and tone appear normal for age. No joint deformities or effusions. Extremities: No clubbing or cyanosis. No lower extremity edema.  Distal pedal pulses are 2+ bilaterally. Neuro: Alert and oriented X 3. Moves all extremities spontaneously. No focal deficits noted. Psych:  Responds to questions appropriately with a normal affect. Skin: No rashes or lesions noted  Wt Readings from Last 3 Encounters:  10/21/18 219 lb 6.4 oz (99.5 kg)  08/07/18 214 lb (97.1 kg)  02/23/18 212 lb (96.2 kg)     Studies/Labs Reviewed:   EKG:  EKG is not ordered today.    Recent Labs: 07/09/2018: Hemoglobin 9.9 09/21/2018: BUN 25; Creatinine, Ser 1.76; Potassium 4.5; Sodium 138   Lipid Panel    Component Value Date/Time   CHOL 154 06/11/2016 1007   TRIG 174 (H) 06/11/2016 1007   HDL 47 06/11/2016 1007   CHOLHDL 3.3 06/11/2016 1007   VLDL 35 (H) 06/11/2016 1007   LDLCALC 72 06/11/2016 1007    Additional studies/ records that were reviewed today include:   Cardiac Catheterization: 04/2017  Patent proximal to mid RCA stent.  Ost LAD to Prox LAD lesion, 30 %stenosed.  Mid LAD to Dist LAD lesion, 60 %stenosed.  Ost 2nd Mrg to 2nd Mrg lesion, 90 %stenosed.  2nd Mrg lesion, 99 %stenosed. Diffusely diseased vessel.  Mid RCA lesion, 100 %stenosed. This is the culprit. There are left to right collaterals.  LV end diastolic pressure is moderately elevated.  There is no aortic valve stenosis.  Hemodynamic findings consistent with mild to moderate pulmonary hypertension.  CO 5.6 L/min. CI 2.7. PA sat 64 %. Unable to wedge the catheter.   Occluded RCA appears to be the  cause of her positive troponin.  Her SHOB is from volume overload and her elevated LVEDP.  No chest pain in over 24 hours.  No benefit from revascularization at this point.    Continue diuresis with careful check of her renal function.  Aggressive secondary prevention.  Echocardiogram: 09/21/2018 Study Conclusions  - Left ventricle: Wall thickness was increased in a pattern of mild   LVH. Systolic function was moderately reduced. The estimated   ejection fraction was in the range of 35% to 40%. Diffuse   hypokinesis. Features are consistent with a pseudonormal left   ventricular filling pattern, with concomitant abnormal relaxation   and increased filling pressure (grade 2 diastolic dysfunction).   Doppler parameters are consistent with high ventricular filling   pressure. - Regional wall motion abnormality: Akinesis of the basal inferior   and apical myocardium; severe hypokinesis of the apical anterior   and mid-apical inferior myocardium; moderate hypokinesis of the   mid inferolateral myocardium. - Aortic valve: Mildly calcified leaflets. There was mild stenosis.   Valve area (VTI): 1.23 cm^2. Valve area (Vmax): 1.22 cm^2. Valve   area (Vmean): 1.29 cm^2. - Mitral valve: There was mild to moderate regurgitation. - Right ventricle: The cavity size was normal. Wall thickness was   mildly increased. - Tricuspid valve: There was mild regurgitation.   Assessment:    1. Chronic combined systolic and diastolic heart failure (HCC)   2. Ischemic cardiomyopathy   3. Coronary artery disease involving native coronary artery of  native heart with angina pectoris (HCC)   4. Nonrheumatic aortic valve stenosis   5. Essential hypertension   6. Mixed hyperlipidemia   7. CKD (chronic kidney disease) stage 3, GFR 30-59 ml/min (HCC)      Plan:   In order of problems listed above:  1. Chronic Combined Systolic and Diastolic CHF/ Ischemic Cardiomyopathy - She has a known reduced EF of 35  to 40% by echocardiogram earlier this year and similar values by repeat echocardiogram in 09/2018.  She has baseline dyspnea on exertion and is on 4 L nasal cannula. Reports weight has been stable on her home scales and she denies any recent orthopnea, PND, or lower extremity edema. Appears euvolemic by examination.  -Will continue on Lasix 80 mg daily (dosing per Nephrology), Toprol-XL, Hydralazine, and Imdur. Not on ACE-I/ARB/ARNI given CKD.   2. CAD - she is s/p DES to Proximal RCA in 2017 with most recent cath in 04/2017 showing patent stent with occluded mid-RCA with L-->R collaterals present and 2nd Mrg stenosis with medical management recommended. - Was admitted with an NSTEMI earlier this year and noted to have scar by NST but no significant ischemia. She does have baseline dyspnea on exertion which has worsened since her event and she reports this is limiting lots of her activities. Denies any chest pain. Catheterization has been avoided given her Stage 3-4 CKD granted she tells me today that her Nephrologist gave her the "all clear" if a cath was definitively needed but her risk of HD in the near future would be significantly higher. We spent a significant amount of time reviewing this today and given her known CTO by catheterization last year and recent NST showing no significant ischemia, will plan to continue with medical management for now. If she has refractory symptoms to this following titration of medications, will need to readdress risk and benefits of a catheterization. Continue ASA, Atorvastatin, Plavix, and beta-blocker therapy. Will titrate Imdur from 30 mg daily to 60 mg daily for additional antianginal benefit.  3. Aortic Stenosis/ Mitral Regurgitation - AS was mild by echocardiogram in 09/2018 and MR was mild to moderate. Will continue to follow.  4. HTN - BP is well controlled at 110/58 during today's visit. She is currently on Lasix 80mg  daily, Hydralazine 50 mg twice daily,  Imdur 30 mg daily, and Toprol-XL 75 mg daily. I encouraged her to follow BP in the ambulatory setting with the dose adjustment of Imdur as outlined above.  5. HLD - Followed by PCP. Goal LDL is less than 70 in the setting of known CAD. She remains on Atorvastatin 40 mg daily.  6. Stage 3-4 CKD - followed by Dr. Allena KatzPatel. Creatinine previously elevated to 2.10 in 06/2018, improved to 1.76 by repeat labs last month.   Medication Adjustments/Labs and Tests Ordered: Current medicines are reviewed at length with the patient today.  Concerns regarding medicines are outlined above.  Medication changes, Labs and Tests ordered today are listed in the Patient Instructions below. Patient Instructions  Medication Instructions:  INCREASE IMDUR TO 60 MG DAILY   Labwork: NONE  Testing/Procedures: NONE  Follow-Up: Your physician recommends that you schedule a follow-up appointment in: 2 MONTHS   Any Other Special Instructions Will Be Listed Below (If Applicable).  If you need a refill on your cardiac medications before your next appointment, please call your pharmacy.    Signed, Ellsworth LennoxBrittany M Kelcee Bjorn, PA-C  10/21/2018 5:37 PM    [facility] [address] [place], [place] [address] Phone: [phone]

## 2018-11-07 ENCOUNTER — Other Ambulatory Visit: Payer: Self-pay | Admitting: Cardiology

## 2018-12-29 NOTE — Progress Notes (Signed)
Cardiology Office Note  Date: 12/30/2018   ID: [name], DOB Jul 21, 1951, MRN [id number]  PCP: Altamease Oiler, FNP  Primary Cardiologist: Nona Dell, MD   Chief Complaint  Patient presents with  . Coronary Artery Disease    History of Present Illness: Teresa Hart is a 68 y.o. female last seen in December 2019 by Ms. Strader PA-C.  She is here with her husband for a follow-up visit.  She reports chronic dyspnea on exertion, no angina symptoms.  We went over her home weights, this is generally climbed about 5 pounds.  We discussed advancing her Lasix temporarily.  Also reviewed fluid and sodium restriction guidelines.  Lexiscan Myoview done in [place] on 07/15/2018 was abnormal reporting a large fixed perfusion defect at the apex, distal anterior wall, basal and mid to distal inferolateral wall as well as mid to distal inferior wall.  No active ischemia described.  LVEF calculated at 29%.  Echocardiogram performed in [place] on 07/13/2018 reported LVEF 35 to 40%, normal right ventricular contraction, mild mitral regurgitation, trace tricuspid regurgitation, PASP 38 mmHg.  Akinetic wall segments described in the apical and distal anterior wall as well as basal inferior wall. Cardiac catheterizationin June 2018revealed patent RCA stent sites, nonobstructive disease within the LAD, diffusely diseased obtuse marginal system, and culprit lesion felt to be mid RCA which was occluded and associated with left to right collaterals. This was managed medically.  She would like to try to exercise but is limited when she walks, also has to carry oxygen with her.  We talked about maybe using a stationary bicycle.  Past Medical History:  Diagnosis Date  . Aortic stenosis, mild    a. per echo Jan 2013; EF is 61%  . Asthma   . CAD (coronary artery disease)    a. 11/2011 abnl MV; b. 11/2011 Cath: No obstructive disease in the large caliber vessels, disease in the small OM2 and distal  LAD, not amenable to PCI or grafting; c. 01/2016 PCI: LM nl, LAD 30ost, 70m, LCX nl, OM1 small, OM2 90 diff, OM3 small, RCA  95p/m 2.75 x 24 Promus Premier DES, 1m. Severe OM2 managed medically.  . Cardiac enlargement   . Carotid stenosis, bilateral    a. per doppler Jan 2013; 60-79% RICA, 40-59% LICA  . CKD (chronic kidney disease), stage III (HCC)   . COPD (chronic obstructive pulmonary disease) (HCC)   . GERD (gastroesophageal reflux disease)   . Hyperlipidemia   . Hypertensive heart disease   . Hypothyroid   . Obese   . Type I diabetes mellitus (HCC)     Past Surgical History:  Procedure Laterality Date  . ABDOMINAL HYSTERECTOMY    . APPENDECTOMY    . CARDIAC CATHETERIZATION  Jan 2013   No major obstructive disease in the large caliber vessels, with disease in a small OM2 and distal LAD; not amenable to PCI or grafting.   Marland Kitchen CARDIAC CATHETERIZATION N/A 02/05/2016   Procedure: Left Heart Cath and Coronary Angiography;  Surgeon: Peter M Swaziland, MD;  Location: [facility] INVASIVE CV LAB;  Service: Cardiovascular;  Laterality: N/A;  . CARDIAC CATHETERIZATION  02/05/2016   Procedure: Coronary Stent Intervention;  Surgeon: Peter M Swaziland, MD;  Location: [facility] INVASIVE CV LAB;  Service: Cardiovascular;;  . CARDIAC CATHETERIZATION N/A 02/20/2016   Procedure: Coronary Stent Intervention;  Surgeon: Lyn Records, MD;  Location: [facility] INVASIVE CV LAB;  Service: Cardiovascular;  Laterality: N/A;  . CATARACT EXTRACTION  08/2016  .  KNEE ARTHROSCOPY  06/24/2012   Procedure: ARTHROSCOPY KNEE;  Surgeon: Nestor Lewandowsky, MD;  Location: [facility];  Service: Orthopedics;  Laterality: Right;  DEBRIDEMENT OF CHONDROMALACIA  . KNEE SURGERY    . RIGHT/LEFT HEART CATH AND CORONARY ANGIOGRAPHY N/A 05/12/2017   Procedure: Right/Left Heart Cath and Coronary Angiography;  Surgeon: Corky Crafts, MD;  Location: [facility] INVASIVE CV LAB;  Service: Cardiovascular;  Laterality: N/A;  . TONSILLECTOMY      Current  Outpatient Medications  Medication Sig Dispense Refill  . ACCU-CHEK AVIVA PLUS test strip CHECK BLOOD SUGAR 4 TIMES DAILY. 450 each 2  . albuterol (PROVENTIL HFA;VENTOLIN HFA) 108 (90 BASE) MCG/ACT inhaler Inhale 2 puffs into the lungs every 6 (six) hours as needed. 18 g 2  . ARIPiprazole (ABILIFY) 20 MG tablet Take 0.5 tablets (10 mg total) by mouth every evening. 90 tablet 1  . aspirin EC 81 MG tablet Take 1 tablet (81 mg total) by mouth daily. 150 tablet 2  . atorvastatin (LIPITOR) 40 MG tablet TAKE 1 TABLET BY MOUTH AT BEDTIME FOR CHOLESTEROL 90 tablet 3  . clopidogrel (PLAVIX) 75 MG tablet Take 75 mg by mouth once a day 90 tablet 3  . diazepam (VALIUM) 5 MG tablet Take 1 tablet (5 mg total) by mouth daily as needed for anxiety. 90 tablet 1  . famotidine (PEPCID) 20 MG tablet Take 20 mg by mouth daily as needed.     . Fluticasone-Umeclidin-Vilant (TRELEGY ELLIPTA) 100-62.5-25 MCG/INH AEPB Inhale daily into the lungs.    . furosemide (LASIX) 40 MG tablet Take 2 tablets (80 mg total) by mouth daily. May take extra tablet for weight gain greater than 2 lbs overnight. 195 tablet 3  . hydrALAZINE (APRESOLINE) 50 MG tablet Take 1 tablet (50 mg total) by mouth 2 (two) times daily. 180 tablet 3  . Insulin Pen Needle (B-D ULTRAFINE III SHORT PEN) 31G X 8 MM MISC 1 each by Does not apply route as directed. 150 each 3  . insulin regular human CONCENTRATED (HUMULIN R) 500 UNIT/ML kwikpen Use as directed. Max daily dose is 300 units.    . isosorbide mononitrate (IMDUR) 60 MG 24 hr tablet Take 1 tablet (60 mg total) by mouth daily. 90 tablet 3  . levocetirizine (XYZAL) 5 MG tablet Take 1 tablet (5 mg total) by mouth every evening. (Patient taking differently: Take 5 mg by mouth daily. ) 30 tablet 5  . Liraglutide (VICTOZA) 18 MG/3ML SOPN Inject 0.3 mLs (1.8 mg total) into the skin at bedtime. 27 pen 0  . metoprolol succinate (TOPROL-XL) 50 MG 24 hr tablet Take 1 tablet (50 mg total) by mouth at bedtime.  Takes 1 1/2 tablets hs  (75 mg) 135 tablet 3  . nitroGLYCERIN (NITROSTAT) 0.4 MG SL tablet Place 1 tablet (0.4 mg total) under the tongue every 5 (five) minutes as needed for chest pain. 25 tablet 3  . OXYGEN Inhale 4 mLs into the lungs.     . pantoprazole (PROTONIX) 40 MG tablet TAKE 1 TABLET BY MOUTH  DAILY 90 tablet 3  . vortioxetine HBr (TRINTELLIX) 20 MG TABS tablet Take 1 tablet (20 mg total) by mouth daily. 90 tablet 1   No current facility-administered medications for this visit.    Allergies:  Brilinta [ticagrelor]; Cinnamon; Fish-derived products; Other; Penicillins; Strawberry extract; Amoxicillin-pot clavulanate; Crestor [rosuvastatin]; and Levaquin [levofloxacin]   Social History: The patient  reports that she has never smoked. She has never used smokeless tobacco.  She reports that she does not drink alcohol or use drugs.  ROS:  Please see the history of present illness. Otherwise, complete review of systems is positive for none.  All other systems are reviewed and negative.   Physical Exam: VS:  BP 126/68   Pulse 68   Ht 5\' 5"  (1.651 m)   Wt 221 lb (100.2 kg)   SpO2 96%   BMI 36.78 kg/m , BMI Body mass index is 36.78 kg/m.  Wt Readings from Last 3 Encounters:  12/30/18 221 lb (100.2 kg)  10/21/18 219 lb 6.4 oz (99.5 kg)  08/07/18 214 lb (97.1 kg)    General: Overweight woman, wearing oxygen via nasal cannula. HEENT: Conjunctiva and lids normal, oropharynx clear. Neck: Supple, no elevated JVP or carotid bruits, no thyromegaly. Lungs: Decreased breath sounds without wheezing, nonlabored breathing at rest. Cardiac: Regular rate and rhythm, no S3 or significant systolic murmur. Abdomen: Soft, nontender, bowel sounds present. Extremities: No pitting edema, distal pulses 2+. Skin: Warm and dry. Musculoskeletal: No kyphosis. Neuropsychiatric: Alert and oriented x3, affect grossly appropriate.  ECG: I personally reviewed the tracing from 08/07/2018 which showed sinus  rhythm with poor R wave progression and nonspecific T wave changes.  Recent Labwork: 07/09/2018: Hemoglobin 9.9 09/21/2018: BUN 25; Creatinine, Ser 1.76; Potassium 4.5; Sodium 138     Component Value Date/Time   CHOL 154 06/11/2016 1007   TRIG 174 (H) 06/11/2016 1007   HDL 47 06/11/2016 1007   CHOLHDL 3.3 06/11/2016 1007   VLDL 35 (H) 06/11/2016 1007   LDLCALC 72 06/11/2016 1007    Other Studies Reviewed Today:  Echocardiogram 09/21/2018: Study Conclusions  - Left ventricle: Wall thickness was increased in a pattern of mild   LVH. Systolic function was moderately reduced. The estimated   ejection fraction was in the range of 35% to 40%. Diffuse   hypokinesis. Features are consistent with a pseudonormal left   ventricular filling pattern, with concomitant abnormal relaxation   and increased filling pressure (grade 2 diastolic dysfunction).   Doppler parameters are consistent with high ventricular filling   pressure. - Regional wall motion abnormality: Akinesis of the basal inferior   and apical myocardium; severe hypokinesis of the apical anterior   and mid-apical inferior myocardium; moderate hypokinesis of the   mid inferolateral myocardium. - Aortic valve: Mildly calcified leaflets. There was mild stenosis.   Valve area (VTI): 1.23 cm^2. Valve area (Vmax): 1.22 cm^2. Valve   area (Vmean): 1.29 cm^2. - Mitral valve: There was mild to moderate regurgitation. - Right ventricle: The cavity size was normal. Wall thickness was   mildly increased. - Tricuspid valve: There was mild regurgitation.  Assessment and Plan:  1.  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  She will increase her Lasix to 80 mg in the morning and 40 mg in the afternoon for a few days, ideally to try to get her weight down 4 to 5 pounds.  Resume previous dose and otherwise continue with stable cardiac regimen.  She will get a follow-up BMET for her next visit.  2.   Ischemic heart disease as detailed above.  Plan is to continue medical therapy in the absence of accelerating angina.  Continue dual antiplatelet regimen.  3.  Mixed hyperlipidemia on Lipitor.  4.  Chronic hypoxic respiratory failure with COPD.  She continues on supplemental oxygen.  Current medicines were reviewed with the patient today.   Orders Placed This Encounter  Procedures  .  Basic Metabolic Panel (BMET)    Disposition: Follow-up in 6 to 8 weeks.  Signed, Jonelle SidleSamuel G. Meshawn Oconnor, MD, [facility] 12/30/2018 4:09 PM    [facility] [address], [place], [place] [address] Phone: [phone]; Fax: [phone]) [phone]

## 2018-12-30 ENCOUNTER — Encounter: Payer: Self-pay | Admitting: Cardiology

## 2018-12-30 ENCOUNTER — Ambulatory Visit: Payer: Medicare Other | Admitting: Cardiology

## 2018-12-30 VITALS — BP 126/68 | HR 68 | Ht 65.0 in | Wt 221.0 lb

## 2018-12-30 DIAGNOSIS — I255 Ischemic cardiomyopathy: Secondary | ICD-10-CM

## 2018-12-30 DIAGNOSIS — I5042 Chronic combined systolic (congestive) and diastolic (congestive) heart failure: Secondary | ICD-10-CM

## 2018-12-30 DIAGNOSIS — I25119 Atherosclerotic heart disease of native coronary artery with unspecified angina pectoris: Secondary | ICD-10-CM

## 2018-12-30 DIAGNOSIS — Z79899 Other long term (current) drug therapy: Secondary | ICD-10-CM

## 2018-12-30 DIAGNOSIS — E782 Mixed hyperlipidemia: Secondary | ICD-10-CM

## 2018-12-30 NOTE — Patient Instructions (Signed)
Medication Instructions: INCREASE Lasix to 80 mg am and 40 mg in the afternoon for 3 days, then, go back to usual dose of 40 mg am and 40 mg afternoon  Labwork: BMET JUST BEFORE VISIT IN 6-8 WEEKS  Procedures/Testing:None today  Follow-Up: 6-8 weeks with Randall An PA-C  Any Additional Special Instructions Will Be Listed Below (If Applicable).     If you need a refill on your cardiac medications before your next appointment, please call your pharmacy.

## 2019-01-13 ENCOUNTER — Ambulatory Visit: Payer: Self-pay

## 2019-02-09 ENCOUNTER — Ambulatory Visit: Payer: Self-pay

## 2019-02-12 ENCOUNTER — Encounter: Payer: Self-pay | Admitting: *Deleted

## 2019-02-15 ENCOUNTER — Other Ambulatory Visit (HOSPITAL_COMMUNITY)
Admission: RE | Admit: 2019-02-15 | Discharge: 2019-02-15 | Disposition: A | Discharge status: Home / Self Care | Payer: Medicare Other | Source: Ambulatory Visit | Attending: Cardiology | Admitting: Cardiology

## 2019-02-15 ENCOUNTER — Other Ambulatory Visit: Payer: Self-pay

## 2019-02-15 DIAGNOSIS — Z79899 Other long term (current) drug therapy: Secondary | ICD-10-CM | POA: Diagnosis present

## 2019-02-15 LAB — BASIC METABOLIC PANEL
ANION GAP: 11 (ref 5–15)
BUN: 27 mg/dL — ABNORMAL HIGH (ref 8–23)
CALCIUM: 9.6 mg/dL (ref 8.9–10.3)
CO2: 31 mmol/L (ref 22–32)
CREATININE: 2.12 mg/dL — AB (ref 0.44–1.00)
Chloride: 97 mmol/L — ABNORMAL LOW (ref 98–111)
GFR calc Af Amer: 27 mL/min — ABNORMAL LOW (ref >60)
GFR calc non Af Amer: 24 mL/min — ABNORMAL LOW (ref >60)
GLUCOSE: 200 mg/dL — AB (ref 70–99)
Potassium: 4.5 mmol/L (ref 3.5–5.1)
Sodium: 139 mmol/L (ref 135–145)

## 2019-02-17 ENCOUNTER — Telehealth (INDEPENDENT_AMBULATORY_CARE_PROVIDER_SITE_OTHER): Payer: Medicare Other | Admitting: Student

## 2019-02-17 ENCOUNTER — Encounter: Payer: Self-pay | Admitting: Student

## 2019-02-17 ENCOUNTER — Ambulatory Visit: Payer: Self-pay | Admitting: Student

## 2019-02-17 VITALS — Ht 65.0 in | Wt 222.0 lb

## 2019-02-17 DIAGNOSIS — I2510 Atherosclerotic heart disease of native coronary artery without angina pectoris: Secondary | ICD-10-CM

## 2019-02-17 DIAGNOSIS — I350 Nonrheumatic aortic (valve) stenosis: Secondary | ICD-10-CM | POA: Diagnosis not present

## 2019-02-17 DIAGNOSIS — E785 Hyperlipidemia, unspecified: Secondary | ICD-10-CM

## 2019-02-17 DIAGNOSIS — I130 Hypertensive heart and chronic kidney disease with heart failure and stage 1 through stage 4 chronic kidney disease, or unspecified chronic kidney disease: Secondary | ICD-10-CM

## 2019-02-17 DIAGNOSIS — I5042 Chronic combined systolic (congestive) and diastolic (congestive) heart failure: Secondary | ICD-10-CM | POA: Diagnosis not present

## 2019-02-17 DIAGNOSIS — N183 Chronic kidney disease, stage 3 unspecified: Secondary | ICD-10-CM

## 2019-02-17 DIAGNOSIS — Z79899 Other long term (current) drug therapy: Secondary | ICD-10-CM

## 2019-02-17 DIAGNOSIS — I10 Essential (primary) hypertension: Secondary | ICD-10-CM

## 2019-02-17 DIAGNOSIS — I255 Ischemic cardiomyopathy: Secondary | ICD-10-CM

## 2019-02-17 NOTE — Patient Instructions (Signed)
Medication Instructions:  Your physician recommends that you continue on your current medications as directed. Please refer to the Current Medication list given to you today.   Labwork: None   Testing/Procedures: None  Follow-Up: Your physician recommends that you schedule a follow-up appointment in:3-4 Months   Any Other Special Instructions Will Be Listed Below (If Applicable).     If you need a refill on your cardiac medications before your next appointment, please call your pharmacy.  Thank you for choosing [facility]!

## 2019-02-17 NOTE — Progress Notes (Signed)
Virtual Visit via Telephone Note    Evaluation Performed:  Follow-up visit  This visit type was conducted due to national recommendations for restrictions regarding the COVID-19 Pandemic (e.g. social distancing).  This format is felt to be most appropriate for this patient at this time.  All issues noted in this document were discussed and addressed.  No physical exam was performed (except for noted visual exam findings with Video Visits).  Please refer to the patient's chart (MyChart message for video visits and phone note for telephone visits) for the patient's consent to telehealth for [facility].  Date:  02/17/2019   ID:  Teresa Hart, DOB 1951/03/01, MRN [id number]  Patient Location:  [address] [place] [address]  Provider location:   Home - [place], [place]  PCP:  Altamease Oiler, FNP  Cardiologist:  Nona Dell, MD Electrophysiologist:  None   Chief Complaint: 2 month follow-up  History of Present Illness:    Teresa Hart is a 68 y.o. female who presents via audio/video conferencing for a telehealth visit today. Past medical history includes CAD (s/p DES to Proximal RCA in 2017, cath in 04/2017 showing patent stent with occluded mid-RCA with L-->R collaterals present and 2nd Mrg stenosis with medical management recommended, NSTEMI in 07/2018 with NST showing no active ischemia), chronic combined systolic and diastolic CHF (EF 01-02% by echo in 07/2018), mild AS, mild to moderate MR, HTN, HLD, Stage 3 CKD and COPD (on 4L [place]).  She was last examined by Dr. Diona Browner in 12/2018 and reported having chronic dyspnea on exertion but denied any recent chest pain. Was felt to be mildly volume overloaded and was informed to increase Lasix to  in AM/40mg  in PM for several days then go back to  daily. Repeat labs were obtained on 3/30 and showed K+ was stable at 4.5 but creatinine has increased from 1.76 to 2.12 but was similar to prior values from earlier in 2019.    In talking with the patient today, she reports her breathing has been at baseline. Uses 4L [place] 24/7. Has baseline 2-pillow orthopnea which is unchanged. Says her edema improved after using the higher dose of Lasix and she at least takes Lasix  in AM daily and takes an extra  in the afternoon at least 3 days per week. Weight has been stable at 219 -221 lbs on her home scales. Denies any chest pain or palpitations.   The patient does not have symptoms concerning for COVID-19 infection (fever, chills, cough, or new shortness of breath).    Prior CV studies:   The following studies were reviewed today:  Echocardiogram: 09/2018 Study Conclusions  - Left ventricle: Wall thickness was increased in a pattern of mild   LVH. Systolic function was moderately reduced. The estimated   ejection fraction was in the range of 35% to 40%. Diffuse   hypokinesis. Features are consistent with a pseudonormal left   ventricular filling pattern, with concomitant abnormal relaxation   and increased filling pressure (grade 2 diastolic dysfunction).   Doppler parameters are consistent with high ventricular filling   pressure. - Regional wall motion abnormality: Akinesis of the basal inferior   and apical myocardium; severe hypokinesis of the apical anterior   and mid-apical inferior myocardium; moderate hypokinesis of the   mid inferolateral myocardium. - Aortic valve: Mildly calcified leaflets. There was mild stenosis.   Valve area (VTI): 1.23 cm^2. Valve area (Vmax): 1.22 cm^2. Valve   area (Vmean): 1.29 cm^2. -  Mitral valve: There was mild to moderate regurgitation. - Right ventricle: The cavity size was normal. Wall thickness was   mildly increased. - Tricuspid valve: There was mild regurgitation.  Cardiac Catheterization: 04/2017  Patent proximal to mid RCA stent.  Ost LAD to Prox LAD lesion, 30 %stenosed.  Mid LAD to Dist LAD lesion, 60 %stenosed.  Ost 2nd Mrg to 2nd Mrg lesion, 90  %stenosed.  2nd Mrg lesion, 99 %stenosed. Diffusely diseased vessel.  Mid RCA lesion, 100 %stenosed. This is the culprit. There are left to right collaterals.  LV end diastolic pressure is moderately elevated.  There is no aortic valve stenosis.  Hemodynamic findings consistent with mild to moderate pulmonary hypertension.  CO 5.6 L/min. CI 2.7. PA sat 64 %. Unable to wedge the catheter.   Occluded RCA appears to be the cause of her positive troponin.  Her SHOB is from volume overload and her elevated LVEDP.  No chest pain in over 24 hours.  No benefit from revascularization at this point.    Continue diuresis with careful check of her renal function.  Aggressive secondary prevention.  Past Medical History:  Diagnosis Date  . Aortic stenosis, mild    a. per echo Jan 2013; EF is 61%  . Asthma   . CAD (coronary artery disease)    a. 11/2011 abnl MV; b. 11/2011 Cath: No obstructive disease in the large caliber vessels, disease in the small OM2 and distal LAD, not amenable to PCI or grafting; c. 01/2016 PCI: LM nl, LAD 30ost, 62m, LCX nl, OM1 small, OM2 90 diff, OM3 small, RCA  95p/m 2.75 x 24 Promus Premier DES, 1m. Severe OM2 managed medically.  . Cardiac enlargement   . Carotid stenosis, bilateral    a. per doppler Jan 2013; 60-79% RICA, 40-59% LICA  . CKD (chronic kidney disease), stage III (HCC)   . COPD (chronic obstructive pulmonary disease) (HCC)   . GERD (gastroesophageal reflux disease)   . Hyperlipidemia   . Hypertensive heart disease   . Hypothyroid   . Obese   . Type I diabetes mellitus (HCC)    Past Surgical History:  Procedure Laterality Date  . ABDOMINAL HYSTERECTOMY    . APPENDECTOMY    . CARDIAC CATHETERIZATION  Jan 2013   No major obstructive disease in the large caliber vessels, with disease in a small OM2 and distal LAD; not amenable to PCI or grafting.   Marland Kitchen CARDIAC CATHETERIZATION N/A 02/05/2016   Procedure: Left Heart Cath and Coronary Angiography;   Surgeon: Peter M Swaziland, MD;  Location: [facility] INVASIVE CV LAB;  Service: Cardiovascular;  Laterality: N/A;  . CARDIAC CATHETERIZATION  02/05/2016   Procedure: Coronary Stent Intervention;  Surgeon: Peter M Swaziland, MD;  Location: [facility] INVASIVE CV LAB;  Service: Cardiovascular;;  . CARDIAC CATHETERIZATION N/A 02/20/2016   Procedure: Coronary Stent Intervention;  Surgeon: Lyn Records, MD;  Location: [facility] INVASIVE CV LAB;  Service: Cardiovascular;  Laterality: N/A;  . CATARACT EXTRACTION  08/2016  . KNEE ARTHROSCOPY  06/24/2012   Procedure: ARTHROSCOPY KNEE;  Surgeon: Nestor Lewandowsky, MD;  Location: [facility];  Service: Orthopedics;  Laterality: Right;  DEBRIDEMENT OF CHONDROMALACIA  . KNEE SURGERY    . RIGHT/LEFT HEART CATH AND CORONARY ANGIOGRAPHY N/A 05/12/2017   Procedure: Right/Left Heart Cath and Coronary Angiography;  Surgeon: Corky Crafts, MD;  Location: [facility] INVASIVE CV LAB;  Service: Cardiovascular;  Laterality: N/A;  . TONSILLECTOMY       Current Meds  Medication Sig  . ACCU-CHEK AVIVA PLUS test strip CHECK BLOOD SUGAR 4 TIMES DAILY.  Marland Kitchen albuterol (PROVENTIL HFA;VENTOLIN HFA) 108 (90 BASE) MCG/ACT inhaler Inhale 2 puffs into the lungs every 6 (six) hours as needed.  . ARIPiprazole (ABILIFY) 20 MG tablet Take 0.5 tablets (10 mg total) by mouth every evening.  Marland Kitchen aspirin EC 81 MG tablet Take 1 tablet (81 mg total) by mouth daily.  Marland Kitchen atorvastatin (LIPITOR) 40 MG tablet TAKE 1 TABLET BY MOUTH AT BEDTIME FOR CHOLESTEROL  . clopidogrel (PLAVIX) 75 MG tablet Take 75 mg by mouth once a day  . diazepam (VALIUM) 5 MG tablet Take 1 tablet (5 mg total) by mouth daily as needed for anxiety.  . famotidine (PEPCID) 20 MG tablet Take 20 mg by mouth daily as needed.   . Fluticasone-Umeclidin-Vilant (TRELEGY ELLIPTA) 100-62.5-25 MCG/INH AEPB Inhale daily into the lungs.  . furosemide (LASIX) 40 MG tablet Take 2 tablets (80 mg total) by mouth daily. May take extra tablet for weight gain greater  than 2 lbs overnight.  . hydrALAZINE (APRESOLINE) 50 MG tablet Take 1 tablet (50 mg total) by mouth 2 (two) times daily.  . Insulin Pen Needle (B-D ULTRAFINE III SHORT PEN) 31G X 8 MM MISC 1 each by Does not apply route as directed.  . insulin regular human CONCENTRATED (HUMULIN R) 500 UNIT/ML kwikpen Use as directed. Max daily dose is 300 units.  . isosorbide mononitrate (IMDUR) 60 MG 24 hr tablet Take 1 tablet (60 mg total) by mouth daily.  Marland Kitchen levocetirizine (XYZAL) 5 MG tablet Take 1 tablet (5 mg total) by mouth every evening. (Patient taking differently: Take 5 mg by mouth daily. )  . Liraglutide (VICTOZA) 18 MG/3ML SOPN Inject 0.3 mLs (1.8 mg total) into the skin at bedtime.  . metoprolol succinate (TOPROL-XL) 50 MG 24 hr tablet Take 1 tablet (50 mg total) by mouth at bedtime. Takes 1 1/2 tablets hs  (75 mg)  . nitroGLYCERIN (NITROSTAT) 0.4 MG SL tablet Place 1 tablet (0.4 mg total) under the tongue every 5 (five) minutes as needed for chest pain.  . OXYGEN Inhale 4 mLs into the lungs.   . pantoprazole (PROTONIX) 40 MG tablet TAKE 1 TABLET BY MOUTH  DAILY  . vortioxetine HBr (TRINTELLIX) 20 MG TABS tablet Take 1 tablet (20 mg total) by mouth daily.     Allergies:   Brilinta [ticagrelor]; Cinnamon; Fish-derived products; Other; Penicillins; Strawberry extract; Amoxicillin-pot clavulanate; Crestor [rosuvastatin]; and Levaquin [levofloxacin]   Social History   Tobacco Use  . Smoking status: Never Smoker  . Smokeless tobacco: Never Used  Substance Use Topics  . Alcohol use: No  . Drug use: No     Family Hx: The patient's family history includes Coronary artery disease in her brother and brother; Heart disease in her brother.  ROS:   Please see the history of present illness.     All other systems reviewed and are negative.   Labs/Other Tests and Data Reviewed:    Recent Labs: 07/09/2018: Hemoglobin 9.9 02/15/2019: BUN 27; Creatinine, Ser 2.12; Potassium 4.5; Sodium 139   Recent  Lipid Panel Lab Results  Component Value Date/Time   CHOL 154 06/11/2016 10:07 AM   TRIG 174 (H) 06/11/2016 10:07 AM   HDL 47 06/11/2016 10:07 AM   CHOLHDL 3.3 06/11/2016 10:07 AM   LDLCALC 72 06/11/2016 10:07 AM    Wt Readings from Last 3 Encounters:  02/17/19 222 lb (100.7 kg)  12/30/18 221 lb (100.2 kg)  10/21/18 219 lb 6.4 oz (99.5 kg)     Objective:    Vital Signs:  Ht 5\' 5"  (1.651 m)   Wt 222 lb (100.7 kg)   BMI 36.94 kg/m    General: Pleasant female, sounding in no acute distress.  Psych: Normal affect. Neuro: Alert and oriented X 3.   ASSESSMENT & PLAN:    1. Chronic Combined Systolic and Diastolic CHF/ Ischemic Cardiomyopathy - she has a known reduced EF of 35-40% but weight has overall been stable on her home scales. Denies any acute changes in her respiratory status.  - remains on Lasix 80mg  daily and takes an extra 40mg  tablet in the afternoon approximately 2-3 days per week with recent labs showing stable renal function. Continue current diuretic dose for now.  - remains on Hydralazine, Imdur, and Toprol-XL. Not on ACE-I/ARB/ARNI given variable renal function.   2. CAD - s/p DES to Proximal RCA in 2017, cath in 04/2017 showing patent stent with occluded mid-RCA with L-->R collaterals present and 2nd Mrg stenosis with medical management recommended. Most recent event was an NSTEMI in 07/2018 with NST showing no active ischemia and cath not pursued given her CKD. - she has baseline dyspnea on exertion in the setting of COPD but denies any recurrent chest pain. Has not had to utilize SL NTG.  - continue current medical regimen including ASA, Plavix, statin, BB, and Imdur 60mg  daily.   3. Aortic Stenosis - mild by echo in 09/2018. Continue to follow.   4. HTN - she does not have a way to check her BP at home but this has been well-controlled at her prior office visits. Will continue current regimen for now.   5. HLD - followed by PCP. Goal LDL is < 70 with known  CAD.  - continue Atorvastatin 40mg  daily.   6. Stage 3 CKD - Creatinine stable at 2.12 by most recent labs. Followed by Dr. Allena Katz.   COVID-19 Education: The signs and symptoms of COVID-19 were discussed with the patient and how to seek care for testing (follow up with PCP or arrange E-visit).  The importance of social distancing was discussed today.  Patient Risk:   After full review of this patient's clinical status, I feel that they are at least moderate risk at this time.  Time:   Today, I have spent 20 minutes with the patient with telehealth technology discussing the above cardiac issues.     Medication Adjustments/Labs and Tests Ordered: Current medicines are reviewed at length with the patient today.  Concerns regarding medicines are outlined above.  Tests Ordered: None Medication Changes: None  Disposition:  Follow up in 3-4 months.   Signed, Ellsworth Lennox, PA-C  02/17/2019 7:02 PM    [facility]
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
## 2019-03-08 ENCOUNTER — Other Ambulatory Visit: Payer: Self-pay

## 2019-03-08 ENCOUNTER — Encounter (HOSPITAL_COMMUNITY): Payer: Self-pay | Admitting: Psychiatry

## 2019-03-08 ENCOUNTER — Ambulatory Visit (INDEPENDENT_AMBULATORY_CARE_PROVIDER_SITE_OTHER): Payer: Medicare Other | Admitting: Psychiatry

## 2019-03-08 DIAGNOSIS — F3342 Major depressive disorder, recurrent, in full remission: Secondary | ICD-10-CM

## 2019-03-08 DIAGNOSIS — F419 Anxiety disorder, unspecified: Secondary | ICD-10-CM | POA: Diagnosis not present

## 2019-03-08 MED ORDER — VORTIOXETINE HBR 20 MG PO TABS: 20.0000 mg | ORAL_TABLET | Freq: Every day | ORAL | 1 refills | Status: AC

## 2019-03-08 MED ORDER — DIAZEPAM 5 MG PO TABS: 5.0000 mg | ORAL_TABLET | Freq: Every day | ORAL | 1 refills | Status: AC | PRN

## 2019-03-08 MED ORDER — ARIPIPRAZOLE 20 MG PO TABS: 10.0000 mg | ORAL_TABLET | Freq: Every evening | ORAL | 1 refills | Status: AC

## 2019-03-08 NOTE — Patient Instructions (Signed)
1. Continue Trintellix 20 mg daily  2. Continue Abilify 10 mg daily  3. Continue Valium 5 mg daily as needed for anxiety  4. Return to clinic insixmonthsfor 15 mins

## 2019-03-22 ENCOUNTER — Telehealth: Payer: Self-pay | Admitting: Cardiology

## 2019-03-22 NOTE — Telephone Encounter (Signed)
I am very sorry to hear of her passing. Thank you for letting me know he called.

## 2019-03-22 NOTE — Telephone Encounter (Signed)
Husband called to let us know that his wife passed away Apr 01, 2019 at home.

## 2019-04-19 DEATH — deceased

## 2019-05-09 ENCOUNTER — Other Ambulatory Visit: Payer: Self-pay | Admitting: Student

## 2019-05-24 ENCOUNTER — Ambulatory Visit: Payer: Self-pay | Admitting: Cardiology
